# Patient Record
Sex: Male | Born: 1965 | Race: Black or African American | Hispanic: No | Marital: Single | Smoking: Former smoker
Health system: Southern US, Community
[De-identification: ages and names within clinical notes are randomized; demographics above are authoritative.]

## PROBLEM LIST (undated history)

## (undated) DIAGNOSIS — D332 Benign neoplasm of brain, unspecified: Secondary | ICD-10-CM

## (undated) DIAGNOSIS — C3490 Malignant neoplasm of unspecified part of unspecified bronchus or lung: Secondary | ICD-10-CM

## (undated) DIAGNOSIS — I10 Essential (primary) hypertension: Secondary | ICD-10-CM

---

## 2005-01-06 ENCOUNTER — Emergency Department: Payer: Self-pay | Admitting: General Practice

## 2005-05-14 ENCOUNTER — Emergency Department: Payer: Self-pay | Admitting: Internal Medicine

## 2005-06-13 ENCOUNTER — Other Ambulatory Visit: Payer: Self-pay

## 2005-06-13 ENCOUNTER — Emergency Department: Payer: Self-pay | Admitting: Internal Medicine

## 2007-02-04 ENCOUNTER — Ambulatory Visit: Payer: Self-pay | Admitting: Family Medicine

## 2008-01-06 ENCOUNTER — Emergency Department: Payer: Self-pay | Admitting: Unknown Physician Specialty

## 2009-01-18 ENCOUNTER — Emergency Department: Payer: Self-pay | Admitting: Emergency Medicine

## 2009-01-26 ENCOUNTER — Emergency Department: Payer: Self-pay | Admitting: Internal Medicine

## 2010-03-07 ENCOUNTER — Emergency Department: Payer: Self-pay | Admitting: Emergency Medicine

## 2014-01-16 ENCOUNTER — Emergency Department: Payer: Self-pay | Admitting: Emergency Medicine

## 2014-01-16 LAB — COMPREHENSIVE METABOLIC PANEL
ALK PHOS: 105 U/L
Albumin: 3.8 g/dL (ref 3.4–5.0)
Anion Gap: 9 (ref 7–16)
BUN: 11 mg/dL (ref 7–18)
Bilirubin,Total: 0.6 mg/dL (ref 0.2–1.0)
CO2: 25 mmol/L (ref 21–32)
CREATININE: 1.06 mg/dL (ref 0.60–1.30)
Calcium, Total: 8.2 mg/dL — ABNORMAL LOW (ref 8.5–10.1)
Chloride: 105 mmol/L (ref 98–107)
EGFR (African American): >60
EGFR (Non-African Amer.): >60
Glucose: 98 mg/dL (ref 65–99)
Osmolality: 277 (ref 275–301)
Potassium: 3.8 mmol/L (ref 3.5–5.1)
SGOT(AST): 341 U/L — ABNORMAL HIGH (ref 15–37)
SGPT (ALT): 321 U/L — ABNORMAL HIGH
Sodium: 139 mmol/L (ref 136–145)
TOTAL PROTEIN: 8.4 g/dL — AB (ref 6.4–8.2)

## 2014-01-16 LAB — URINALYSIS, COMPLETE
Bacteria: NONE SEEN
Bilirubin,UR: NEGATIVE
Blood: NEGATIVE
Glucose,UR: NEGATIVE mg/dL (ref 0–75)
KETONE: NEGATIVE
Leukocyte Esterase: NEGATIVE
Nitrite: NEGATIVE
PH: 5 (ref 4.5–8.0)
Protein: NEGATIVE
SPECIFIC GRAVITY: 1.018 (ref 1.003–1.030)
WBC UR: 1 /HPF (ref 0–5)

## 2014-01-16 LAB — CBC
HCT: 47.3 % (ref 40.0–52.0)
HGB: 15.6 g/dL (ref 13.0–18.0)
MCH: 33.4 pg (ref 26.0–34.0)
MCHC: 33 g/dL (ref 32.0–36.0)
MCV: 101 fL — AB (ref 80–100)
PLATELETS: 218 10*3/uL (ref 150–440)
RBC: 4.68 10*6/uL (ref 4.40–5.90)
RDW: 14.2 % (ref 11.5–14.5)
WBC: 6.4 10*3/uL (ref 3.8–10.6)

## 2014-01-16 LAB — LIPASE, BLOOD: Lipase: 155 U/L (ref 73–393)

## 2015-08-17 ENCOUNTER — Emergency Department
Admission: EM | Admit: 2015-08-17 | Discharge: 2015-08-17 | Disposition: A | Discharge status: Home / Self Care | Payer: Self-pay | Attending: Emergency Medicine | Admitting: Emergency Medicine

## 2015-08-17 DIAGNOSIS — R197 Diarrhea, unspecified: Secondary | ICD-10-CM | POA: Insufficient documentation

## 2015-08-17 DIAGNOSIS — R112 Nausea with vomiting, unspecified: Secondary | ICD-10-CM | POA: Insufficient documentation

## 2015-08-17 DIAGNOSIS — J069 Acute upper respiratory infection, unspecified: Secondary | ICD-10-CM | POA: Insufficient documentation

## 2015-08-17 DIAGNOSIS — Z880 Allergy status to penicillin: Secondary | ICD-10-CM | POA: Insufficient documentation

## 2015-08-17 DIAGNOSIS — F17200 Nicotine dependence, unspecified, uncomplicated: Secondary | ICD-10-CM | POA: Insufficient documentation

## 2015-08-17 LAB — RAPID INFLUENZA A&B ANTIGENS
Influenza A (ARMC): NOT DETECTED
Influenza B (ARMC): NOT DETECTED

## 2015-08-17 MED ORDER — ONDANSETRON 4 MG PO TBDP: 4.0000 mg | ORAL_TABLET | Freq: Four times a day (QID) | ORAL | Status: DC | PRN

## 2015-08-17 NOTE — ED Notes (Addendum)
Pt states that he started feeling bad, states that he is here with his fiance who is also a pt with similar s/s, pt states that he is also having some diarrhea, bodyaches, and weakness that started yesterday

## 2015-08-17 NOTE — ED Provider Notes (Signed)
[facility] Emergency Department Provider Note ____________________________________________  Time seen: 0715  I have reviewed the triage vital signs and the nursing notes.  HISTORY  Chief Complaint  Generalized Body Aches; Fever; and Weakness  HPI Lucas Yu is a 51 y.o. male presents to the ED for evaluation of sudden onset of subjective fevers, headache, sinus congestion and general muscle pain. He describes onset of symptoms just today morning, which popped him to call out of work. Since that time he has dosed Alka-Seltzer fist tabs with minimal relief. He did also have some mild nausea and vomiting yesterday, which is resolved today. Patient denies receiving a flu vaccine for this season. He presents here with his partner who presents with similar symptoms. The patient actually endorses that he feels better today than he did yesterday. He rates his generalized muscle pain at a 7/10 in triage.  History reviewed. No pertinent past medical history.  There are no active problems to display for this patient.  History reviewed. No pertinent past surgical history.  Current Outpatient Rx  Name  Route  Sig  Dispense  Refill  . ondansetron (ZOFRAN ODT) 4 MG disintegrating tablet   Oral   Take 1 tablet (4 mg total) by mouth every 6 (six) hours as needed for nausea or vomiting.   15 tablet   0    Allergies Penicillins  No family history on file.  Social History Social History  Substance Use Topics  . Smoking status: Current Every Day Smoker  . Smokeless tobacco: None  . Alcohol Use: Yes   Review of Systems  Constitutional: Positive for subjective fever. Eyes: Negative for visual changes. ENT: Negative for sore throat. Cardiovascular: Negative for chest pain. Respiratory: Negative for shortness of breath. Gastrointestinal: Positive for resolved abdominal pain, vomiting and diarrhea. Genitourinary: Negative for dysuria. Musculoskeletal: Negative for  back pain. Reports general muscle pain Skin: Negative for rash. Neurological: Negative for headaches, focal weakness or numbness. ____________________________________________  PHYSICAL EXAM:  VITAL SIGNS: ED Triage Vitals  Enc Vitals Group     BP 08/17/15 0622 119/95 mmHg     Pulse Rate 08/17/15 0622 65     Resp 08/17/15 0622 18     Temp 08/17/15 0622 97.7 F (36.5 C)     Temp Source 08/17/15 0622 Oral     SpO2 08/17/15 0622 100 %     Weight 08/17/15 0622 176 lb (79.833 kg)     Height 08/17/15 0622  (1.727 m)     Head Cir --      Peak Flow --      Pain Score 08/17/15 0623 7     Pain Loc --      Pain Edu? --      Excl. in GC? --    Constitutional: Alert and oriented. Well appearing and in no distress. Head: Normocephalic and atraumatic.      Eyes: Conjunctivae are normal. PERRL. Normal extraocular movements      Ears: Canals clear. TMs intact bilaterally.   Nose: No congestion/rhinorrhea.   Mouth/Throat: Mucous membranes are moist. Uvula midline and tonsils are flat.    Neck: Supple. No thyromegaly. Hematological/Lymphatic/Immunological: No cervical lymphadenopathy. Cardiovascular: Normal rate, regular rhythm.  Respiratory: Normal respiratory effort. No wheezes/rales/rhonchi. Gastrointestinal: Soft and nontender. No distention, rebound, guarding, rigidity, organomegaly. Bowel sounds. Musculoskeletal: Nontender with normal range of motion in all extremities.  Neurologic:  Normal gait without ataxia. Normal speech and language. No gross focal neurologic deficits are appreciated. Skin:  Skin is warm, dry and intact. No rash noted. Psychiatric: Mood and affect are normal. Patient exhibits appropriate insight and judgment. ____________________________________________   LABS (pertinent positives/negatives) Labs Reviewed  RAPID INFLUENZA A&B ANTIGENS ([facility] ONLY)  ____________________________________________  INITIAL IMPRESSION / ASSESSMENT AND PLAN / ED  COURSE  Patient with symptoms consistent with an upper respiratory infection and a mild gastroenteritis, most likely of a viral etiology. His rapid influenza was negative today. He will be discharged with a prescription for Zofran to dose as needed for nausea and vomiting control. He is to continue to treat his symptoms with over-the-counter cough and cold medications. He will follow up with [facility] for ongoing symptom management. Return precautions are reviewed. ____________________________________________  FINAL CLINICAL IMPRESSION(S) / ED DIAGNOSES  Final diagnoses:  URI, acute  Nausea vomiting and diarrhea      Lissa Hoard, PA-C 08/17/15 8119  Jennye Moccasin, MD 08/17/15 [phone]

## 2015-08-17 NOTE — Discharge Instructions (Signed)
Nausea, Adult Nausea means you feel sick to your stomach or need to throw up (vomit). It may be a sign of a more serious problem. If nausea gets worse, you may throw up. If you throw up a lot, you may lose too much body fluid (dehydration). HOME CARE   Get plenty of rest.  Ask your doctor how to replace body fluid losses (rehydrate).  Eat small amounts of food. Sip liquids more often.  Take all medicines as told by your doctor. GET HELP RIGHT AWAY IF:  You have a fever.  You pass out (faint).  You keep throwing up or have blood in your throw up.  You are very weak, have dry lips or a dry mouth, or you are very thirsty (dehydrated).  You have dark or bloody poop (stool).  You have very bad chest or belly (abdominal) pain.  You do not get better after 2 days, or you get worse.  You have a headache. MAKE SURE YOU:  Understand these instructions.  Will watch your condition.  Will get help right away if you are not doing well or get worse.   This information is not intended to replace advice given to you by your health care provider. Make sure you discuss any questions you have with your health care provider.   Document Released: 05/29/2011 Document Revised: 09/01/2011 Document Reviewed: 05/29/2011 Elsevier Interactive Patient Education 2016 Else[employer] Inc[employer]  Viral Infections A viral infection can be caused by different types of viruses.Most viral infections are not serious and resolve on their own. However, some infections may cause severe symptoms and may lead to further complications. SYMPTOMS Viruses can frequently cause:  Minor sore throat.  Aches and pains.  Headaches.  Runny nose.  Different types of rashes.  Watery eyes.  Tiredness.  Cough.  Loss of appetite.  Gastrointestinal infections, resulting in nausea, vomiting, and diarrhea. These symptoms do not respond to antibiotics because the infection is not caused by bacteria. However, you might catch  a bacterial infection following the viral infection. This is sometimes called a "superinfection." Symptoms of such a bacterial infection may include:  Worsening sore throat with pus and difficulty swallowing.  Swollen neck glands.  Chills and a high or persistent fever.  Severe headache.  Tenderness over the sinuses.  Persistent overall ill feeling (malaise), muscle aches, and tiredness (fatigue).  Persistent cough.  Yellow, green, or brown mucus production with coughing. HOME CARE INSTRUCTIONS   Only take over-the-counter or prescription medicines for pain, discomfort, diarrhea, or fever as directed by your caregiver.  Drink enough water and fluids to keep your urine clear or pale yellow. Sports drinks can provide valuable electrolytes, sugars, and hydration.  Get plenty of rest and maintain proper nutrition. Soups and broths with crackers or rice are fine. SEEK IMMEDIATE MEDICAL CARE IF:   You have severe headaches, shortness of breath, chest pain, neck pain, or an unusual rash.  You have uncontrolled vomiting, diarrhea, or you are unable to keep down fluids.  You or your child has an oral temperature above 102 F (38.9 C), not controlled by medicine.  Your baby is older than 3 months with a rectal temperature of 102 F (38.9 C) or higher.  Your baby is 17 months old or younger with a rectal temperature of 100.4 F (38 C) or higher. MAKE SURE YOU:   Understand these instructions.  Will watch your condition.  Will get help right away if you are not doing well or get worse.  This information is not intended to replace advice given to you by your health care provider. Make sure you discuss any questions you have with your health care provider.   Document Released: 03/19/2005 Document Revised: 09/01/2011 Document Reviewed: 11/15/2014 Elsevier Interactive Patient Education [employer]  Your exam is normal today. Your flu test was negative. Continue to monitor  and treat fevers. Take the anti-nausea medicine as needed for prevent dehydration. Follow-up with [facility] as needed for ongoing symptoms.

## 2015-08-17 NOTE — ED Notes (Signed)
Patient presents with body aches, fever, generalized fatigue and malaise, diarrhea and poor appetite. Drinking fluids but it comes "right through me."

## 2015-10-15 ENCOUNTER — Emergency Department
Admission: EM | Admit: 2015-10-15 | Discharge: 2015-10-15 | Disposition: A | Discharge status: Home / Self Care | Payer: Self-pay | Attending: Emergency Medicine | Admitting: Emergency Medicine

## 2015-10-15 ENCOUNTER — Encounter: Payer: Self-pay | Admitting: Emergency Medicine

## 2015-10-15 DIAGNOSIS — F17200 Nicotine dependence, unspecified, uncomplicated: Secondary | ICD-10-CM | POA: Insufficient documentation

## 2015-10-15 DIAGNOSIS — M545 Low back pain, unspecified: Secondary | ICD-10-CM

## 2015-10-15 MED ORDER — KETOROLAC TROMETHAMINE 30 MG/ML IJ SOLN
30.0000 mg | Freq: Once | INTRAMUSCULAR | Status: AC
  Administered 2015-10-15: 30 mg via INTRAMUSCULAR
  Filled 2015-10-15: qty 1

## 2015-10-15 MED ORDER — CYCLOBENZAPRINE HCL 5 MG PO TABS
5.0000 mg | ORAL_TABLET | Freq: Three times a day (TID) | ORAL | Status: DC | PRN
Start: 2015-10-15 — End: 2015-11-30

## 2015-10-15 MED ORDER — NAPROXEN 500 MG PO TABS: 500.0000 mg | ORAL_TABLET | Freq: Two times a day (BID) | ORAL | Status: DC

## 2015-10-15 NOTE — ED Provider Notes (Signed)
[facility] Emergency Department Provider Note    ____________________________________________  Time seen: 7:20  I have reviewed the triage vital signs and the nursing notes.   HISTORY  Chief Complaint Back Pain     HPI Lucas Yu is a 50 y.o. male, NAD, presents to the emergency department with complaints of lower back pain. He states the pain started Saturday, but cannot identify any inciting event that  caused the pain. Notes that he woke up with the pain. He describes the pain as pulling in nature.  Took Tylenol and Saturday and states he "laid around". Woke this morning and the pain was worse. Has not taken anything for the pain since yesterday.  Denies changes in urinary/bowel habits, saddle paraesthesias, or lower extremity numbness.  History reviewed. No pertinent past medical history.  There are no active problems to display for this patient.   History reviewed. No pertinent past surgical history.  Current Outpatient Rx  Name  Route  Sig  Dispense  Refill  . cyclobenzaprine (FLEXERIL) 5 MG tablet   Oral   Take 1 tablet (5 mg total) by mouth every 8 (eight) hours as needed for muscle spasms.   21 tablet   0   . naproxen (NAPROSYN) 500 MG tablet   Oral   Take 1 tablet (500 mg total) by mouth 2 (two) times daily with a meal.   14 tablet   0   . ondansetron (ZOFRAN ODT) 4 MG disintegrating tablet   Oral   Take 1 tablet (4 mg total) by mouth every 6 (six) hours as needed for nausea or vomiting.   15 tablet   0     Allergies Penicillins  History reviewed. No pertinent family history.  Social History Social History  Substance Use Topics  . Smoking status: Current Every Day Smoker  . Smokeless tobacco: None  . Alcohol Use: Yes    Review of Systems Constitutional: No fever/chills, fatigue Cardiovascular: No chest pain. Respiratory: No shortness of breath.  Musculoskeletal: Positive for lower back pain. No neck pain.  Skin:  Negative for rash, skin sores. Neurological: Negative for headaches, focal weakness or numbness. No saddle paresthesias, loss of bowel or bladder control 10-point ROS otherwise negative. ____________________________________________   PHYSICAL EXAM: Constitutional:   VITAL SIGNS: ED Triage Vitals  Enc Vitals Group     BP 10/15/15 0708 133/77 mmHg     Pulse Rate 10/15/15 0708 67     Resp 10/15/15 0708 18     Temp 10/15/15 0708 98.9 F (37.2 C)     Temp Source 10/15/15 0708 Oral     SpO2 10/15/15 0708 97 %     Weight 10/15/15 0708 170 lb (77.111 kg)     Height 10/15/15 0708  (1.727 m)     Head Cir --      Peak Flow --      Pain Score 10/15/15 0709 8     Pain Loc --      Pain Edu? --      Excl. in GC? --     Constitutional: Alert and oriented. Well appearing and in no distress. Eyes:  Conjunctivae are normal.  Head: Normocephalic and atraumatic. Cardiovascular: Peripheral pulses intact. Respiratory: Normal respiratory effort. Musculoskeletal: Mild limited flexion of the lumbar spine due to pain. Able to rotate fully. Mild tenderness to palpation of the lumbar spine and right lateral paraspinal areas. No step-offs or bony deformities noted. Mild right thoracic muscle spasm appreciated to  palpation.  Neurologic: Normal gait without ataxia. Normal speech and language. No gross focal neurologic deficits are appreciated. Skin: Skin is warm, dry and intact. No rash noted. Psychiatric: Mood and affect are normal. Patient exhibits appropriate insight and judgment   ____________________________________________  INITIAL IMPRESSION / ASSESSMENT AND PLAN / ED COURSE  Patient's diagnosis is consistent with lower back pain without sciatica. Patient will be discharged home with prescriptions for Flexeril and Naprosyn to take as directed. Patient was given IM Toradol 30 mg while in clinic and noted some improvement in pain with such. [facility] if symptoms persist past this treatment course. Patient is given ED precautions to return to the ED for any worsening or new symptoms.    ____________________________________________   FINAL CLINICAL IMPRESSION(S) / ED DIAGNOSES  Final diagnoses:  Midline low back pain without sciatica      Hope PigeonJami L Hagler, PA-C 10/15/15 0947  Myrna Blazeravid Matthew Schaevitz, MD 10/15/15 [phone]

## 2015-10-15 NOTE — ED Notes (Signed)
Pt present with lower back pain since Saturday. Denies injury or trauma; no prior instances of pain. NAD noted.

## 2015-10-15 NOTE — ED Notes (Signed)
Pt alert and oriented X4, active, cooperative, pt in NAD. RR even and unlabored, color WNL.  Pt informed to return if any life threatening symptoms occur.   

## 2015-10-15 NOTE — Discharge Instructions (Signed)
Back Exercises °The following exercises strengthen the muscles that help to support the back. They also help to keep the lower back flexible. Doing these exercises can help to prevent back pain or lessen existing pain. °If you have back pain or discomfort, try doing these exercises 2-3 times each day or as told by your health care provider. When the pain goes away, do them once each day, but increase the number of times that you repeat the steps for each exercise (do more repetitions). If you do not have back pain or discomfort, do these exercises once each day or as told by your health care provider. °EXERCISES °Single Knee to Chest °Repeat these steps 3-5 times for each leg: °· Lie on your back on a firm bed or the floor with your legs extended. °· Bring one knee to your chest. Your other leg should stay extended and in contact with the floor. °· Hold your knee in place by grabbing your knee or thigh. °· Pull on your knee until you feel a gentle stretch in your lower back. °· Hold the stretch for 10-30 seconds. °· Slowly release and straighten your leg. °Pelvic Tilt °Repeat these steps 5-10 times: °· Lie on your back on a firm bed or the floor with your legs extended. °· Bend your knees so they are pointing toward the ceiling and your feet are flat on the floor. °· Tighten your lower abdominal muscles to press your lower back against the floor. This motion will tilt your pelvis so your tailbone points up toward the ceiling instead of pointing to your feet or the floor. °· With gentle tension and even breathing, hold this position for 5-10 seconds. °Cat-Cow °Repeat these steps until your lower back becomes more flexible: °· Get into a hands-and-knees position on a firm surface. Keep your hands under your shoulders, and keep your knees under your hips. You may place padding under your knees for comfort. °· Let your head hang down, and point your tailbone toward the floor so your lower back becomes rounded like the  back of a cat. °· Hold this position for 5 seconds. °· Slowly lift your head and point your tailbone up toward the ceiling so your back forms a sagging arch like the back of a cow. °· Hold this position for 5 seconds. °Press-Ups °Repeat these steps 5-10 times: °· Lie on your abdomen (face-down) on the floor. °· Place your palms near your head, about shoulder-width apart. °· While you keep your back as relaxed as possible and keep your hips on the floor, slowly straighten your arms to raise the top half of your body and lift your shoulders. Do not use your back muscles to raise your upper torso. You may adjust the placement of your hands to make yourself more comfortable. °· Hold this position for 5 seconds while you keep your back relaxed. °· Slowly return to lying flat on the floor. °Bridges °Repeat these steps 10 times: °1. Lie on your back on a firm surface. °2. Bend your knees so they are pointing toward the ceiling and your feet are flat on the floor. °3. Tighten your buttocks muscles and lift your buttocks off of the floor until your waist is at almost the same height as your knees. You should feel the muscles working in your buttocks and the back of your thighs. If you do not feel these muscles, slide your feet 1-2 inches farther away from your buttocks. °4. Hold this position for 3-5   seconds. °5. Slowly lower your hips to the starting position, and allow your buttocks muscles to relax completely. °If this exercise is too easy, try doing it with your arms crossed over your chest. °Abdominal Crunches °Repeat these steps 5-10 times: °1. Lie on your back on a firm bed or the floor with your legs extended. °2. Bend your knees so they are pointing toward the ceiling and your feet are flat on the floor. °3. Cross your arms over your chest. °4. Tip your chin slightly toward your chest without bending your neck. °5. Tighten your abdominal muscles and slowly raise your trunk (torso) high enough to lift your shoulder  blades a tiny bit off of the floor. Avoid raising your torso higher than that, because it can put too much stress on your low back and it does not help to strengthen your abdominal muscles. °6. Slowly return to your starting position. °Back Lifts °Repeat these steps 5-10 times: °1. Lie on your abdomen (face-down) with your arms at your sides, and rest your forehead on the floor. °2. Tighten the muscles in your legs and your buttocks. °3. Slowly lift your chest off of the floor while you keep your hips pressed to the floor. Keep the back of your head in line with the curve in your back. Your eyes should be looking at the floor. °4. Hold this position for 3-5 seconds. °5. Slowly return to your starting position. °SEEK MEDICAL CARE IF: °· Your back pain or discomfort gets much worse when you do an exercise. °· Your back pain or discomfort does not lessen within 2 hours after you exercise. °If you have any of these problems, stop doing these exercises right away. Do not do them again unless your health care provider says that you can. °SEEK IMMEDIATE MEDICAL CARE IF: °· You develop sudden, severe back pain. If this happens, stop doing the exercises right away. Do not do them again unless your health care provider says that you can. °  °This information is not intended to replace advice given to you by your health care provider. Make sure you discuss any questions you have with your health care provider. °  °Document Released: 07/17/2004 Document Revised: 02/28/2015 Document Reviewed: 08/03/2014 °Elsevier Interactive Patient Education ©2016 Else[employer] Inc[employer] ° °Back Pain, Adult °Back pain is very common in adults. The cause of back pain is rarely dangerous and the pain often gets better over time. The cause of your back pain may not be known. Some common causes of back pain include: °· Strain of the muscles or ligaments supporting the spine. °· Wear and tear (degeneration) of the spinal disks. °· Arthritis. °· Direct injury  to the back. °For many people, back pain may return. Since back pain is rarely dangerous, most people can learn to manage this condition on their own. °HOME CARE INSTRUCTIONS °Watch your back pain for any changes. The following actions may help to lessen any discomfort you are feeling: °· Remain active. It is stressful on your back to sit or stand in one place for long periods of time. Do not sit, drive, or stand in one place for more than 30 minutes at a time. Take short walks on even surfaces as soon as you are able. Try to increase the length of time you walk each day. °· Exercise regularly as directed by your health care provider. Exercise helps your back heal faster. It also helps avoid future injury by keeping your muscles strong and flexible. °· Do not stay in   bed. Resting more than 1-2 days can delay your recovery. °· Pay attention to your body when you bend and lift. The most comfortable positions are those that put less stress on your recovering back. Always use proper lifting techniques, including: °¨ Bending your knees. °¨ Keeping the load close to your body. °¨ Avoiding twisting. °· Find a comfortable position to sleep. Use a firm mattress and lie on your side with your knees slightly bent. If you lie on your back, put a pillow under your knees. °· Avoid feeling anxious or stressed. Stress increases muscle tension and can worsen back pain. It is important to recognize when you are anxious or stressed and learn ways to manage it, such as with exercise. °· Take medicines only as directed by your health care provider. Over-the-counter medicines to reduce pain and inflammation are often the most helpful. Your health care provider may prescribe muscle relaxant drugs. These medicines help dull your pain so you can more quickly return to your normal activities and healthy exercise. °· Apply ice to the injured area: °¨ Put ice in a plastic bag. °¨ Place a towel between your skin and the bag. °¨ Leave the ice on  for 20 minutes, 2-3 times a day for the first 2-3 days. After that, ice and heat may be alternated to reduce pain and spasms. °· Maintain a healthy weight. Excess weight puts extra stress on your back and makes it difficult to maintain good posture. °SEEK MEDICAL CARE IF: °· You have pain that is not relieved with rest or medicine. °· You have increasing pain going down into the legs or buttocks. °· You have pain that does not improve in one week. °· You have night pain. °· You lose weight. °· You have a fever or chills. °SEEK IMMEDIATE MEDICAL CARE IF:  °· You develop new bowel or bladder control problems. °· You have unusual weakness or numbness in your arms or legs. °· You develop nausea or vomiting. °· You develop abdominal pain. °· You feel faint. °  °This information is not intended to replace advice given to you by your health care provider. Make sure you discuss any questions you have with your health care provider. °  °Document Released: 06/09/2005 Document Revised: 06/30/2014 Document Reviewed: 10/11/2013 °Elsevier Interactive Patient Education ©2016 [employer] ° °

## 2015-11-30 ENCOUNTER — Emergency Department
Admission: EM | Admit: 2015-11-30 | Discharge: 2015-11-30 | Disposition: A | Discharge status: Home / Self Care | Payer: Self-pay | Attending: Emergency Medicine | Admitting: Emergency Medicine

## 2015-11-30 DIAGNOSIS — F17200 Nicotine dependence, unspecified, uncomplicated: Secondary | ICD-10-CM | POA: Insufficient documentation

## 2015-11-30 DIAGNOSIS — H9202 Otalgia, left ear: Secondary | ICD-10-CM | POA: Insufficient documentation

## 2015-11-30 MED ORDER — NEOMYCIN-POLYMYXIN-HC 3.5-10000-1 OT SOLN: 3.0000 [drp] | Freq: Three times a day (TID) | OTIC | Status: AC

## 2015-11-30 NOTE — Discharge Instructions (Signed)
Earache An earache, also called otalgia, can be caused by many things. Pain from an earache can be sharp, dull, or burning. The pain may be temporary or constant. Earaches can be caused by problems with the ear, such as infection in either the middle ear or the ear canal, injury, impacted ear wax, middle ear pressure, or a foreign body in the ear. Ear pain can also result from problems in other areas. This is called referred pain. For example, pain can come from a sore throat, a tooth infection, or problems with the jaw or the joint between the jaw and the skull (temporomandibular joint, or TMJ). The cause of an earache is not always easy to identify. Watchful waiting may be appropriate for some earaches until a clear cause of the pain can be found. HOME CARE INSTRUCTIONS Watch your condition for any changes. The following actions may help to lessen any discomfort that you are feeling:  Take medicines only as directed by your health care provider. This includes ear drops.  Apply ice to your outer ear to help reduce pain.  Put ice in a plastic bag.  Place a towel between your skin and the bag.  Leave the ice on for 20 minutes, 2-3 times per day.  Do not put anything in your ear other than medicine that is prescribed by your health care provider.  Try resting in an upright position instead of lying down. This may help to reduce pressure in the middle ear and relieve pain.  Chew gum if it helps to relieve your ear pain.  Control any allergies that you have.  Keep all follow-up visits as directed by your health care provider. This is important. SEEK MEDICAL CARE IF:  Your pain does not improve within 2 days.  You have a fever.  You have new or worsening symptoms. SEEK IMMEDIATE MEDICAL CARE IF:  You have a severe headache.  You have a stiff neck.  You have difficulty swallowing.  You have redness or swelling behind your ear.  You have drainage from your ear.  You have hearing  loss.  You feel dizzy.   This information is not intended to replace advice given to you by your health care provider. Make sure you discuss any questions you have with your health care provider.   Document Released: 01/25/2004 Document Revised: 06/30/2014 Document Reviewed: 01/08/2014 Elsevier Interactive Patient Education 2016 [employer]   Do not use Q-tips. Begin using eardrops as directed. Follow-up and make an appointment with Dr. Jenne CampusMcQueen if any continued problems with your ears.

## 2015-11-30 NOTE — ED Notes (Signed)
Patient with onset of left ear pain since last week. States the pain goes down into this neck.

## 2015-11-30 NOTE — ED Notes (Signed)
Pt reports ear pain began 3 days ago.  Pt denies any drainage. C/o some pain around the ear and back of the neck.  Denies sore throat.  No meds taken for pain.

## 2015-11-30 NOTE — ED Provider Notes (Signed)
[facility] Emergency Department Provider Note   ____________________________________________  Time seen: Approximately 7:15 AM  I have reviewed the triage vital signs and the nursing notes.   HISTORY  Chief Complaint Otalgia   HPI SAAHIL HERBSTER is a 50 y.o. male here for left ear pain that began 3 days ago. Patient denies any cold symptoms, cough, runny nose, sneezing. He denies any fever or drainage from his ear. He denies any history of ear infections. He states that he did use a Q-tip but only uses it for the outside of his ears. He rates his pain as an 8/10.   History reviewed. No pertinent past medical history.  There are no active problems to display for this patient.   History reviewed. No pertinent past surgical history.  Current Outpatient Rx  Name  Route  Sig  Dispense  Refill  . neomycin-polymyxin-hydrocortisone (CORTISPORIN) otic solution   Left Ear   Place 3 drops into the left ear 3 (three) times daily.   10 mL   0     Allergies Penicillins  No family history on file.  Social History Social History  Substance Use Topics  . Smoking status: Current Every Day Smoker  . Smokeless tobacco: None  . Alcohol Use: Yes    Review of Systems Constitutional: No fever/chills Eyes: No visual changes. ENT: No sore throat. Positive left ear pain. Cardiovascular: Denies chest pain. Respiratory: Denies shortness of breath. Gastrointestinal:   No nausea, no vomiting.   Skin: Negative for rash. Neurological: Negative for headaches, focal weakness or numbness.  10-point ROS otherwise negative.  ____________________________________________   PHYSICAL EXAM:  VITAL SIGNS: ED Triage Vitals  Enc Vitals Group     BP 11/30/15 0623 111/75 mmHg     Pulse Rate 11/30/15 0623 70     Resp 11/30/15 0623 68     Temp 11/30/15 0623 98.4 F (36.9 C)     Temp Source 11/30/15 0623 Oral     SpO2 11/30/15 0623 99 %     Weight 11/30/15 0623  175 lb (79.379 kg)     Height 11/30/15 0623  (1.778 m)     Head Cir --      Peak Flow --      Pain Score 11/30/15 0624 8     Pain Loc --      Pain Edu? --      Excl. in GC? --     Constitutional: Alert and oriented. Well appearing and in no acute distress. Eyes: Conjunctivae are normal. PERRL. EOMI. Head: Atraumatic. Nose: No congestion/rhinnorhea.   EACs are clear bilaterally. TMs are dull with no erythema or injection seen. There is no exudate in the canal. Minimal tenderness on palpation of the [place] of the left ear. Prior to exam patient had both ears stuffed with put paper. Mouth/Throat: Mucous membranes are moist.  Oropharynx non-erythematous. Neck: No stridor.   Hematological/Lymphatic/Immunilogical: No cervical lymphadenopathy. Cardiovascular: Normal rate, regular rhythm. Grossly normal heart sounds.  Good peripheral circulation. Respiratory: Normal respiratory effort.  No retractions. Lungs CTAB. Musculoskeletal: Moves upper and lower extremities without difficulty and normal gait was noted. Neurologic:  Normal speech and language. No gross focal neurologic deficits are appreciated. No gait instability. Skin:  Skin is warm, dry and intact. No rash noted. Psychiatric: Mood and affect are normal. Speech and behavior are normal.  ____________________________________________   LABS (all labs ordered are listed, but only abnormal results are displayed)  Labs Reviewed - No  data to display   PROCEDURES  Procedure(s) performed: None  Critical Care performed: No  ____________________________________________   INITIAL IMPRESSION / ASSESSMENT AND PLAN / ED COURSE  Pertinent labs & imaging results that were available during my care of the patient were reviewed by me and considered in my medical decision making (see chart for details).  Patient is to follow-up with Dr. Jenne CampusMcQueen if any continued problems. There was some tenderness on visualization of the ear canal with  a ear speculum most likely due to patient using Q-tip. There was no trauma visualized. An TM bilaterally does not appear to be infected. Patient then states that he did not go to work today and needs a note excusing him from work. ____________________________________________   FINAL CLINICAL IMPRESSION(S) / ED DIAGNOSES  Final diagnoses:  Otalgia of left ear      NEW MEDICATIONS STARTED DURING THIS VISIT:  Discharge Medication List as of 11/30/2015  7:35 AM    START taking these medications   Details  neomycin-polymyxin-hydrocortisone (CORTISPORIN) otic solution Place 3 drops into the left ear 3 (three) times daily., Starting 11/30/2015, Until Mon 12/10/15, Print         Note:  This document was prepared using Dragon voice recognition software and may include unintentional dictation errors.    Tommi Rumpshonda L Summers, PA-C 11/30/15 [id number]  Jeanmarie PlantJames A McShane, MD 11/30/15 [id number]

## 2015-12-12 ENCOUNTER — Emergency Department
Admission: EM | Admit: 2015-12-12 | Discharge: 2015-12-12 | Disposition: A | Discharge status: Home / Self Care | Payer: Self-pay | Attending: Emergency Medicine | Admitting: Emergency Medicine

## 2015-12-12 ENCOUNTER — Encounter: Payer: Self-pay | Admitting: Emergency Medicine

## 2015-12-12 DIAGNOSIS — R110 Nausea: Secondary | ICD-10-CM | POA: Insufficient documentation

## 2015-12-12 DIAGNOSIS — F17200 Nicotine dependence, unspecified, uncomplicated: Secondary | ICD-10-CM | POA: Insufficient documentation

## 2015-12-12 DIAGNOSIS — K047 Periapical abscess without sinus: Secondary | ICD-10-CM | POA: Insufficient documentation

## 2015-12-12 MED ORDER — PROMETHAZINE HCL 12.5 MG PO TABS: 12.5000 mg | ORAL_TABLET | Freq: Four times a day (QID) | ORAL | Status: DC | PRN

## 2015-12-12 MED ORDER — HYDROCODONE-ACETAMINOPHEN 5-325 MG PO TABS: 1.0000 | ORAL_TABLET | Freq: Four times a day (QID) | ORAL | Status: DC | PRN

## 2015-12-12 MED ORDER — CLINDAMYCIN HCL 300 MG PO CAPS: 300.0000 mg | ORAL_CAPSULE | Freq: Three times a day (TID) | ORAL | Status: DC

## 2015-12-12 NOTE — ED Notes (Signed)
Patient presents to the ED with dental pain since Monday and nausea, vomiting and diarrhea that began last night.  Patient states, "I think I have an abscess in my tooth."   Patient reports 4 episodes of vomiting and 3 episodes of diarrhea in the past 24 hours.  Patient denies abdominal pain.  Patient states his only pain is on the right side of his face.  Patient is in no obvious distress at this time. Patient denies eating anything unusual yesterday.

## 2015-12-12 NOTE — ED Provider Notes (Signed)
[facility] Emergency Department Provider Note ____________________________________________  Time seen: Approximately 11:54 AM  I have reviewed the triage vital signs and the nursing notes.   HISTORY  Chief Complaint Dental Pain; Emesis; and Diarrhea   HPI Lucas Yu is a 50 y.o. male who arrives to the emergency department with complaint of dental pain since Saturday. He first noticed tingling in the right upper gum and swelling by Monday. On Tuesday he gargled hydrogen peroxide which helped with the pain for a period of time but when the pain returned it was significantly worse. He does not have a dentist that he sees regularly. States pain has been preventing him from completing tasks at work. Describes pain as aching and sharp rated at 9/10.  At 4 am last night he reports 3-4 episodes of vomiting without coffee ground appearance or hematemesis and 3 episodes of diarrhea over the last 24 hours without hematochezia or melena. Denies fevers chills, increased fatigue, abdominal pain, chest pain and palpitations.  History reviewed. No pertinent past medical history.  There are no active problems to display for this patient.   History reviewed. No pertinent past surgical history.  Current Outpatient Rx  Name  Route  Sig  Dispense  Refill  . clindamycin (CLEOCIN) 300 MG capsule   Oral   Take 1 capsule (300 mg total) by mouth 3 (three) times daily.   30 capsule   0   . HYDROcodone-acetaminophen (NORCO/VICODIN) 5-325 MG tablet   Oral   Take 1 tablet by mouth every 6 (six) hours as needed.   9 tablet   0   . promethazine (PHENERGAN) 12.5 MG tablet   Oral   Take 1 tablet (12.5 mg total) by mouth every 6 (six) hours as needed for nausea or vomiting.   12 tablet   0     Allergies Penicillins  No family history on file.  Social History Social History  Substance Use Topics  . Smoking status: Current Every Day Smoker  . Smokeless tobacco: None  .  Alcohol Use: Yes    Review of Systems Constitutional: Well appearing. No recent illness or injury. ENT: Positive for dental pain. Musculoskeletal: Negative for jaw pain/trismus. Skin: Negative for erythema, positive for facial swelling ____________________________________________   PHYSICAL EXAM:  VITAL SIGNS: ED Triage Vitals  Enc Vitals Group     BP 12/12/15 1118 116/95 mmHg     Pulse Rate 12/12/15 1118 71     Resp 12/12/15 1118 18     Temp 12/12/15 1118 98.3 F (36.8 C)     Temp Source 12/12/15 1118 Oral     SpO2 12/12/15 1118 100 %     Weight --      Height --      Head Cir --      Peak Flow --      Pain Score 12/12/15 1122 9     Pain Loc --      Pain Edu? --      Excl. in GC? --     Constitutional: Alert and oriented. Well appearing and in no acute distress. Eyes: Conjunctivae are normal.EOMI. Mouth/Throat: Mucous membranes are moist. Oropharynx non-erythematous. Periodontal Exam    Hematological/Lymphatic/Immunilogical: No cervical lymphadenopathy. Respiratory: Normal respiratory effort.  Musculoskeletal: Full ROM x 4 extremities. Neurologic:  Normal speech and language. No gross focal neurologic deficits are appreciated. Speech is normal. No gait instability. Skin:  Mild right upper facial swelling consistent with location of dental abscess. See periodontal  Dental exam above. Psychiatric: Mood and affect are normal. Speech and behavior are normal.  ____________________________________________   LABS (all labs ordered are listed, but only abnormal results are displayed)  Labs Reviewed - No data to display ____________________________________________   RADIOLOGY  Not indicated ____________________________________________   PROCEDURES  Procedure(s) performed: None  Critical Care performed: No  ____________________________________________   INITIAL IMPRESSION / ASSESSMENT AND PLAN / ED COURSE  Pertinent labs & imaging results that were  available during my care of the patient were reviewed by me and considered in my medical decision making (see chart for details). Patient will be prescribed clindamycin and Norco. Patient was advised to see the dentist within 14 days. Also advised to take the antibiotic until finished. Instructed to return to the ER for symptoms that change or worsen if you are unable to schedule an appointment. He was given an updated list of dental clinics in the area. ____________________________________________   FINAL CLINICAL IMPRESSION(S) / ED DIAGNOSES  Final diagnoses:  Dental abscess  Nausea    Note:  This document was prepared using Dragon voice recognition software and may include unintentional dictation errors.   Chinita Pester, FNP 12/12/15 1410  Minna Antis, MD 12/13/15 2007

## 2015-12-12 NOTE — Discharge Instructions (Signed)
Dental Abscess °A dental abscess is a collection of pus in or around a tooth. °CAUSES °This condition is caused by a bacterial infection around the root of the tooth that involves the inner part of the tooth (pulp). It may result from: °· Severe tooth decay. °· Trauma to the tooth that allows bacteria to enter into the pulp, such as a broken or chipped tooth. °· Severe gum disease around a tooth. °SYMPTOMS °Symptoms of this condition include: °· Severe pain in and around the infected tooth. °· Swelling and redness around the infected tooth, in the mouth, or in the face. °· Tenderness. °· Pus drainage. °· Bad breath. °· Bitter taste in the mouth. °· Difficulty swallowing. °· Difficulty opening the mouth. °· Nausea. °· Vomiting. °· Chills. °· Swollen neck glands. °· Fever. °DIAGNOSIS °This condition is diagnosed with examination of the infected tooth. During the exam, your dentist may tap on the infected tooth. Your dentist will also ask about your medical and dental history and may order X-rays. °TREATMENT °This condition is treated by eliminating the infection. This may be done with: °· Antibiotic medicine. °· A root canal. This may be performed to save the tooth. °· Pulling (extracting) the tooth. This may also involve draining the abscess. This is done if the tooth cannot be saved. °HOME CARE INSTRUCTIONS °· Take medicines only as directed by your dentist. °· If you were prescribed antibiotic medicine, finish all of it even if you start to feel better. °· Rinse your mouth (gargle) often with salt water to relieve pain or swelling. °· Do not drive or operate heavy machinery while taking pain medicine. °· Do not apply heat to the outside of your mouth. °· Keep all follow-up visits as directed by your dentist. This is important. °SEEK MEDICAL CARE IF: °· Your pain is worse and is not helped by medicine. °SEEK IMMEDIATE MEDICAL CARE IF: °· You have a fever or chills. °· Your symptoms suddenly get worse. °· You have a  very bad headache. °· You have problems breathing or swallowing. °· You have trouble opening your mouth. °· You have swelling in your neck or around your eye. °  °This information is not intended to replace advice given to you by your health care provider. Make sure you discuss any questions you have with your health care provider. °  °Document Released: 06/09/2005 Document Revised: 10/24/2014 Document Reviewed: 06/06/2014 °Elsevier Interactive Patient Education ©2016 Elsevier Inc. ° ° ° ° ° ° °OPTIONS FOR DENTAL FOLLOW UP CARE ° °[place] Department of Health and Human Services - Local Safety Net Dental Clinics °http://www.ncdhhs.gov/dph/oralhealth/services/safetynetclinics.htm °  °[facility] (336-562-3123) ° °[facility] [place] (919-933-9087) ° °[facility] (919-663-1744 ext 237) ° °[facility] (336-570-6415) ° °[facility] (919-968-2025) °This clinic caters to the indigent population and is on a lottery system. °Location: °[facility], [address], [place] °Clinic Hours: °Wednesdays from 6pm - 9pm, patients seen by a lottery system. °For dates, call or go to www.med.[facility].edu/[facility]/patients/Dental-[facility] °Services: °Cleanings, fillings and simple extractions. °Payment Options: °DENTAL WORK IS FREE OF CHARGE. Bring proof of income or support. °Best way to get seen: °Arrive at 5:15 pm - this is a lottery, NOT first come/first serve, so arriving earlier will not increase your chances of being seen. °  °  °[facility] °919-537-3737 °Select option 1 for emergencies °  °Location: °[facility], [address], [place] °  Clinic Hours: °No walk-ins accepted - call the day before to schedule an appointment. °Check in times are 9:30 am and 1:30 pm. °Services: °Simple extractions, temporary fillings, pulpectomy/pulp debridement, uncomplicated abscess drainage. °Payment Options: °PAYMENT IS  DUE AT THE TIME OF SERVICE.  Fee is usually $100-200, additional surgical procedures (e.g. abscess drainage) may be extra. °Cash, checks, Visa/MasterCard accepted.  Can file Medicaid if patient is covered for dental - patient should call case worker to check. °No discount for [facility] patients. °Best way to get seen: °MUST call the day before and get onto the schedule. Can usually be seen the next 1-2 days. No walk-ins accepted. °  °  °[facility] °[phone] °  °Location: °[facility], [address], [place] °Clinic Hours: °M, W, Th, F 8am or 1:30pm, Tues 9a or 1:30 - first come/first served. °Services: °Simple extractions, temporary fillings, uncomplicated abscess drainage.  You do not need to be an [place] resident. °Payment Options: °PAYMENT IS DUE AT THE TIME OF SERVICE. °Dental insurance, otherwise sliding scale - bring proof of income or support. °Depending on income and treatment needed, cost is usually $50-200. °Best way to get seen: °Arrive early as it is first come/first served. °  °  °[facility] °919-542-1641 °  °Location: °[address] °Clinic Hours: °Mon-Thu 8a-5p °Services: °Most basic dental services including extractions and fillings. °Payment Options: °PAYMENT IS DUE AT THE TIME OF SERVICE. °Sliding scale, up to 50% off - bring proof if income or support. °Medicaid with dental option accepted. °Best way to get seen: °Call to schedule an appointment, can usually be seen within 2 weeks OR they will try to see walk-ins - show up at 8a or 2p (you may have to wait). °  °  °[facility] °919-245-2435 °[place] RESIDENTS ONLY °  °Location: °[facility], [address], [place], [place] [address] °Clinic Hours: By appointment only. °Monday - Thursday 8am-5pm, Friday 8am-12pm °Services: Cleanings, fillings, extractions. °Payment Options: °PAYMENT IS DUE AT THE TIME OF  SERVICE. °Cash, Visa or MasterCard. Sliding scale - $30 minimum per service. °Best way to get seen: °Come in to office, complete packet and make an appointment - need proof of income °or support monies for each household member and proof of [place] residence. °Usually takes about a month to get in. °  °  °[facility] °[phone] °  °Location: °[address] [place] °Clinic Hours: Walk-in Urgent Care Dental Services are offered Monday-Friday mornings only. °The numbers of emergencies accepted daily is limited to the number of °providers available. °Maximum 15 - Mondays, Wednesdays & Thursdays °Maximum 10 - Tuesdays & Fridays °Services: °You do not need to be a [place] resident to be seen for a dental emergency. °Emergencies are defined as pain, swelling, abnormal bleeding, or dental trauma. Walkins will receive x-rays if needed. °NOTE: Dental cleaning is not an emergency. °Payment Options: °PAYMENT IS DUE AT THE TIME OF SERVICE. °Minimum co-pay is $40.00 for uninsured patients. °Minimum co-pay is $3.00 for Medicaid with dental coverage. °Dental Insurance is accepted and must be presented at time of visit. °Medicare does not cover dental. °Forms of payment: Cash, credit card, checks. °Best way to get seen: °If not previously registered with the clinic, walk-in dental registration begins at 7:15 am and is on a first come/first serve basis. °If previously registered with the clinic, call to make an appointment. °  °  °  [facility] °[phone] °[place] RESIDENTS ONLY °  °Location: °[address], [place], [place] °Clinic Hours: °Mon-Thu 10a-2p °Services: Extractions only! °Payment Options: °FREE (donations accepted) - bring proof of income or support °Best way to get seen: °Call and schedule an appointment OR come at 8am on the 1st Monday of every month (except for holidays) when it is first come/first served. °  °  °[facility] °[phone] °   °Location: °[address], [place] °Clinic Hours: °Friday mornings °Services, Payment Options, Best way to get seen: °Call for info °

## 2016-12-30 ENCOUNTER — Emergency Department
Admission: EM | Admit: 2016-12-30 | Discharge: 2016-12-30 | Disposition: A | Discharge status: Home / Self Care | Payer: Self-pay | Attending: Emergency Medicine | Admitting: Emergency Medicine

## 2016-12-30 ENCOUNTER — Encounter: Payer: Self-pay | Admitting: *Deleted

## 2016-12-30 DIAGNOSIS — F17200 Nicotine dependence, unspecified, uncomplicated: Secondary | ICD-10-CM | POA: Insufficient documentation

## 2016-12-30 DIAGNOSIS — K529 Noninfective gastroenteritis and colitis, unspecified: Secondary | ICD-10-CM | POA: Insufficient documentation

## 2016-12-30 LAB — COMPREHENSIVE METABOLIC PANEL
ALT: 100 U/L — AB (ref 17–63)
AST: 121 U/L — AB (ref 15–41)
Albumin: 4.5 g/dL (ref 3.5–5.0)
Alkaline Phosphatase: 72 U/L (ref 38–126)
Anion gap: 10 (ref 5–15)
BUN: 15 mg/dL (ref 6–20)
CHLORIDE: 97 mmol/L — AB (ref 101–111)
CO2: 28 mmol/L (ref 22–32)
CREATININE: 1.32 mg/dL — AB (ref 0.61–1.24)
Calcium: 9.4 mg/dL (ref 8.9–10.3)
GFR calc Af Amer: >60 mL/min (ref >60)
GFR calc non Af Amer: >60 mL/min (ref >60)
Glucose, Bld: 111 mg/dL — ABNORMAL HIGH (ref 65–99)
Potassium: 3.7 mmol/L (ref 3.5–5.1)
Sodium: 135 mmol/L (ref 135–145)
Total Bilirubin: 1.3 mg/dL — ABNORMAL HIGH (ref 0.3–1.2)
Total Protein: 9 g/dL — ABNORMAL HIGH (ref 6.5–8.1)

## 2016-12-30 LAB — CBC
HCT: 47.8 % (ref 40.0–52.0)
Hemoglobin: 16.2 g/dL (ref 13.0–18.0)
MCH: 33.2 pg (ref 26.0–34.0)
MCHC: 33.9 g/dL (ref 32.0–36.0)
MCV: 97.9 fL (ref 80.0–100.0)
PLATELETS: 231 10*3/uL (ref 150–440)
RBC: 4.89 MIL/uL (ref 4.40–5.90)
RDW: 14 % (ref 11.5–14.5)
WBC: 5.5 10*3/uL (ref 3.8–10.6)

## 2016-12-30 LAB — LIPASE, BLOOD: LIPASE: 27 U/L (ref 11–51)

## 2016-12-30 MED ORDER — ONDANSETRON 4 MG PO TBDP
4.0000 mg | ORAL_TABLET | Freq: Three times a day (TID) | ORAL | 0 refills | Status: DC | PRN
Start: 2016-12-30 — End: 2017-08-21

## 2016-12-30 MED ORDER — LOPERAMIDE HCL 2 MG PO TABS: 2.0000 mg | ORAL_TABLET | Freq: Four times a day (QID) | ORAL | 0 refills | Status: DC | PRN

## 2016-12-30 NOTE — ED Triage Notes (Signed)
States vomiting and diarrhea that began on Sunday, unable to remember how many episodes of each, states general body aches, denies any pain at present, pt awake and alert in no acute distress

## 2016-12-30 NOTE — ED Provider Notes (Signed)
[facility] Emergency Department Provider Note   ____________________________________________    I have reviewed the triage vital signs and the nursing notes.   HISTORY  Chief Complaint Emesis and Diarrhea     HPI Lucas Yu is a 51 y.o. male who presents with diarrhea and nausea and vomiting. He reports the symptoms started overnight and have continued through this morning. He is unclear what causes. Does report that his wife was sick last week with similar symptoms. He denies fevers but has had body aches. Mild abdominal cramping but none currently. No recent travel.   History reviewed. No pertinent past medical history.  There are no active problems to display for this patient.   History reviewed. No pertinent surgical history.  Prior to Admission medications   Medication Sig Start Date End Date Taking? Authorizing Provider  clindamycin (CLEOCIN) 300 MG capsule Take 1 capsule (300 mg total) by mouth 3 (three) times daily. Patient not taking: Reported on 12/30/2016 12/12/15   Kem Boroughsriplett, Cari B, FNP  HYDROcodone-acetaminophen (NORCO/VICODIN) 5-325 MG tablet Take 1 tablet by mouth every 6 (six) hours as needed. Patient not taking: Reported on 12/30/2016 12/12/15   Kem Boroughsriplett, Cari B, FNP  loperamide (IMODIUM A-D) 2 MG tablet Take 1 tablet (2 mg total) by mouth 4 (four) times daily as needed for diarrhea or loose stools. 12/30/16   Jene EveryKinner, Mishal Probert, MD  ondansetron (ZOFRAN ODT) 4 MG disintegrating tablet Take 1 tablet (4 mg total) by mouth every 8 (eight) hours as needed for nausea or vomiting. 12/30/16   Jene EveryKinner, Carley Strickling, MD  promethazine (PHENERGAN) 12.5 MG tablet Take 1 tablet (12.5 mg total) by mouth every 6 (six) hours as needed for nausea or vomiting. Patient not taking: Reported on 12/30/2016 12/12/15   Chinita Pesterriplett, Cari B, FNP     Allergies Penicillins  History reviewed. No pertinent family history.  Social History Social History  Substance Use  Topics  . Smoking status: Current Every Day Smoker  . Smokeless tobacco: Not on file  . Alcohol use Yes    Review of Systems  Constitutional: No fever/chills Eyes: No visual changes.  ENT: No sore throat. Cardiovascular: Denies chest pain. Respiratory: Denies shortness of breath. Gastrointestinal: As above Genitourinary: Negative for dysuria. Musculoskeletal: Negative for back pain. Skin: Negative for rash. Neurological: Negative for headaches or weakness   ____________________________________________   PHYSICAL EXAM:  VITAL SIGNS: ED Triage Vitals  Enc Vitals Group     BP 12/30/16 1021 139/85     Pulse Rate 12/30/16 1021 85     Resp 12/30/16 1021 16     Temp 12/30/16 1021 98.9 F (37.2 C)     Temp Source 12/30/16 1021 Oral     SpO2 12/30/16 1021 100 %     Weight 12/30/16 1020 68 kg (150 lb)     Height 12/30/16 1020 1.727 m (5\' 8" )     Head Circumference --      Peak Flow --      Pain Score --      Pain Loc --      Pain Edu? --      Excl. in GC? --     Constitutional: Alert and oriented. No acute distress. Pleasant and interactive Eyes: Conjunctivae are normal.  Head: Atraumatic. Nose: No congestion/rhinnorhea.  Cardiovascular: Normal rate, regular rhythm. Grossly normal heart sounds.  Good peripheral circulation. Respiratory: Normal respiratory effort.  No retractions. Lungs CTAB. Gastrointestinal: Soft and nontender. No distention.  No CVA tenderness.  Genitourinary: deferred Musculoskeletal: No lower extremity tenderness nor edema.  Warm and well perfused Neurologic:  Normal speech and language. No gross focal neurologic deficits are appreciated.  Skin:  Skin is warm, dry and intact. No rash noted. Psychiatric: Mood and affect are normal. Speech and behavior are normal.  ____________________________________________   LABS (all labs ordered are listed, but only abnormal results are displayed)  Labs Reviewed  COMPREHENSIVE METABOLIC PANEL - Abnormal;  Notable for the following:       Result Value   Chloride 97 (*)    Glucose, Bld 111 (*)    Creatinine, Ser 1.32 (*)    Total Protein 9.0 (*)    AST 121 (*)    ALT 100 (*)    Total Bilirubin 1.3 (*)    All other components within normal limits  LIPASE, BLOOD  CBC   ____________________________________________  EKG  None ____________________________________________  RADIOLOGY  None ____________________________________________   PROCEDURES  Procedure(s) performed: No    Critical Care performed: No ____________________________________________   INITIAL IMPRESSION / ASSESSMENT AND PLAN / ED COURSE  Pertinent labs & imaging results that were available during my care of the patient were reviewed by me and considered in my medical decision making (see chart for details).  Patient well-appearing and in no acute distress. Chronic elevation of LFTs. Well appearing clinically, recommend symptomatic treatment of likely viral gastroenteritis    ____________________________________________   FINAL CLINICAL IMPRESSION(S) / ED DIAGNOSES  Final diagnoses:  Gastroenteritis      NEW MEDICATIONS STARTED DURING THIS VISIT:  Discharge Medication List as of 12/30/2016 11:24 AM    START taking these medications   Details  loperamide (IMODIUM A-D) 2 MG tablet Take 1 tablet (2 mg total) by mouth 4 (four) times daily as needed for diarrhea or loose stools., Starting Tue 12/30/2016, Print    ondansetron (ZOFRAN ODT) 4 MG disintegrating tablet Take 1 tablet (4 mg total) by mouth every 8 (eight) hours as needed for nausea or vomiting., Starting Tue 12/30/2016, Print         Note:  This document was prepared using Dragon voice recognition software and may include unintentional dictation errors.    Jene Every, MD 12/30/16 [phone]

## 2017-04-27 ENCOUNTER — Encounter: Payer: Self-pay | Admitting: Emergency Medicine

## 2017-04-27 ENCOUNTER — Other Ambulatory Visit: Payer: Self-pay

## 2017-04-27 ENCOUNTER — Emergency Department
Admission: EM | Admit: 2017-04-27 | Discharge: 2017-04-27 | Disposition: A | Discharge status: Home / Self Care | Payer: Self-pay | Attending: Emergency Medicine | Admitting: Emergency Medicine

## 2017-04-27 DIAGNOSIS — K0889 Other specified disorders of teeth and supporting structures: Secondary | ICD-10-CM

## 2017-04-27 DIAGNOSIS — F17200 Nicotine dependence, unspecified, uncomplicated: Secondary | ICD-10-CM | POA: Insufficient documentation

## 2017-04-27 DIAGNOSIS — K047 Periapical abscess without sinus: Secondary | ICD-10-CM | POA: Insufficient documentation

## 2017-04-27 MED ORDER — CLINDAMYCIN HCL 300 MG PO CAPS: 300.0000 mg | ORAL_CAPSULE | Freq: Four times a day (QID) | ORAL | 0 refills | Status: DC

## 2017-04-27 MED ORDER — HYDROCODONE-ACETAMINOPHEN 5-325 MG PO TABS: 1.0000 | ORAL_TABLET | ORAL | 0 refills | Status: DC | PRN

## 2017-04-27 MED ORDER — OXYCODONE-ACETAMINOPHEN 5-325 MG PO TABS
1.0000 | ORAL_TABLET | Freq: Once | ORAL | Status: AC
  Administered 2017-04-27: 1 via ORAL
  Filled 2017-04-27: qty 1

## 2017-04-27 MED ORDER — CLINDAMYCIN HCL 150 MG PO CAPS
300.0000 mg | ORAL_CAPSULE | Freq: Once | ORAL | Status: AC
  Administered 2017-04-27: 300 mg via ORAL
  Filled 2017-04-27: qty 2

## 2017-04-27 NOTE — Discharge Instructions (Signed)
OPTIONS FOR DENTAL FOLLOW UP CARE ° °[place] Department of Health and Human Services - Local Safety Net Dental Clinics °http://www.ncdhhs.gov/dph/oralhealth/services/safetynetclinics.htm °  °[facility] (336-562-3123) ° °[facility] [place] ([phone]) ° °[facility] ([phone] ext [phone]) ° °[facility] ([phone]) ° °[facility] ([phone]) °This clinic caters to the indigent population and is on a lottery system. °Location: °[facility], [address], [place] °Clinic Hours: °Wednesdays from 6pm - 9pm, patients seen by a lottery system. °For dates, call or go to [email or web address]/[facility]/patients/[email or web address] °Services: °Cleanings, fillings and simple extractions. °Payment Options: °DENTAL WORK IS FREE OF CHARGE. Bring proof of income or support. °Best way to get seen: °Arrive at 5:15 pm - this is a lottery, NOT first come/first serve, so arriving earlier will not increase your chances of being seen. °  °  °[facility] °[phone] °Select option 1 for emergencies °  °Location: °[facility], [address], [place] °Clinic Hours: °No walk-ins accepted - call the day before to schedule an appointment. °Check in times are 9:30 am and 1:30 pm. °Services: °Simple extractions, temporary fillings, pulpectomy/pulp debridement, uncomplicated abscess drainage. °Payment Options: °PAYMENT IS DUE AT THE TIME OF SERVICE.  Fee is usually $100-200, additional surgical procedures (e.g. abscess drainage) may be extra. °Cash, checks, Visa/MasterCard accepted.  Can file Medicaid if patient is covered for dental - patient should call case worker to check. °No discount for [facility] patients. °Best way to get seen: °MUST call the day before and get onto the schedule. Can usually be seen the next 1-2 days. No walk-ins accepted. °  °  °[facility] °[phone] °   °Location: °[facility], [address], [place] °Clinic Hours: °M, W, Th, F 8am or 1:30pm, Tues 9a or 1:30 - first come/first served. °Services: °Simple extractions, temporary fillings, uncomplicated abscess drainage.  You do not need to be an [place] resident. °Payment Options: °PAYMENT IS DUE AT THE TIME OF SERVICE. °Dental insurance, otherwise sliding scale - bring proof of income or support. °Depending on income and treatment needed, cost is usually $50-200. °Best way to get seen: °Arrive early as it is first come/first served. °  °  °[facility] °[phone] °  °Location: °[address] °Clinic Hours: °Mon-Thu 8a-5p °Services: °Most basic dental services including extractions and fillings. °Payment Options: °PAYMENT IS DUE AT THE TIME OF SERVICE. °Sliding scale, up to 50% off - bring proof if income or support. °Medicaid with dental option accepted. °Best way to get seen: °Call to schedule an appointment, can usually be seen within 2 weeks OR they will try to see walk-ins - show up at 8a or 2p (you may have to wait). °  °  °[facility] °[phone] °[place] RESIDENTS ONLY °  °Location: °[facility], [address], [place], [place] [address] °Clinic Hours: By appointment only. °Monday - Thursday 8am-5pm, Friday 8am-12pm °Services: Cleanings, fillings, extractions. °Payment Options: °PAYMENT IS DUE AT THE TIME OF SERVICE. °Cash, Visa or MasterCard. Sliding scale - $30 minimum per service. °Best way to get seen: °Come in to office, complete packet and make an appointment - need proof of income °or support monies for each household member and proof of [place] residence. °Usually takes about a month to get in. °  °  °[facility] °[phone] °  °Location: °[address]   [place] °Clinic Hours: Walk-in Urgent Care Dental Services are offered Monday-Friday  mornings only. °The numbers of emergencies accepted daily is limited to the number of °providers available. °Maximum 15 - Mondays, Wednesdays & Thursdays °Maximum 10 - Tuesdays & Fridays °Services: °You do not need to be a [place] resident to be seen for a dental emergency. °Emergencies are defined as pain, swelling, abnormal bleeding, or dental trauma. Walkins will receive x-rays if needed. °NOTE: Dental cleaning is not an emergency. °Payment Options: °PAYMENT IS DUE AT THE TIME OF SERVICE. °Minimum co-pay is $40.00 for uninsured patients. °Minimum co-pay is $3.00 for Medicaid with dental coverage. °Dental Insurance is accepted and must be presented at time of visit. °Medicare does not cover dental. °Forms of payment: Cash, credit card, checks. °Best way to get seen: °If not previously registered with the clinic, walk-in dental registration begins at 7:15 am and is on a first come/first serve basis. °If previously registered with the clinic, call to make an appointment. °  °  °[facility] °[phone] °[place] RESIDENTS ONLY °  °Location: °[address], [place], [place] °Clinic Hours: °Mon-Thu 10a-2p °Services: Extractions only! °Payment Options: °FREE (donations accepted) - bring proof of income or support °Best way to get seen: °Call and schedule an appointment OR come at 8am on the 1st Monday of every month (except for holidays) when it is first come/first served. °  °  °[facility] °[phone] °  °Location: °[address], [place] °Clinic Hours: °Friday mornings °Services, Payment Options, Best way to get seen: °Call for info °

## 2017-04-27 NOTE — ED Provider Notes (Signed)
[facility] Emergency Department Provider Note  ____________________________________________  Time seen: Approximately 7:29 PM  I have reviewed the triage vital signs and the nursing notes.   HISTORY  Chief Complaint Dental Pain    HPI Lucas Yu is a 51 y.o. male who presents emergency department complaining of left lower dentition pain.  Patient reports that pain has been increasing over the last 3 days.  No dental trauma.  No history of recurrent dental infections.  Patient states that he has bad dentition but does not have a dentist.  Patient denies any fevers or chills, difficulty breathing or swallowing.  Patient has not tried any medications for this complaint prior to  History reviewed. No pertinent past medical history.  There are no active problems to display for this patient.   No past surgical history on file.  Prior to Admission medications   Medication Sig Start Date End Date Taking? Authorizing Provider  clindamycin (CLEOCIN) 300 MG capsule Take 1 capsule (300 mg total) 4 (four) times daily by mouth. 04/27/17   Seferina Brokaw, Delorise RoyalsJonathan D, PA-C  HYDROcodone-acetaminophen (NORCO/VICODIN) 5-325 MG tablet Take 1 tablet every 4 (four) hours as needed by mouth for moderate pain. 04/27/17   Tara Wich, Delorise RoyalsJonathan D, PA-C  loperamide (IMODIUM A-D) 2 MG tablet Take 1 tablet (2 mg total) by mouth 4 (four) times daily as needed for diarrhea or loose stools. 12/30/16   Jene EveryKinner, Robert, MD  ondansetron (ZOFRAN ODT) 4 MG disintegrating tablet Take 1 tablet (4 mg total) by mouth every 8 (eight) hours as needed for nausea or vomiting. 12/30/16   Jene EveryKinner, Robert, MD  promethazine (PHENERGAN) 12.5 MG tablet Take 1 tablet (12.5 mg total) by mouth every 6 (six) hours as needed for nausea or vomiting. Patient not taking: Reported on 12/30/2016 12/12/15   Kem Boroughsriplett, Cari B, FNP    Allergies Penicillins  No family history on file.  Social History Social History   Tobacco  Use  . Smoking status: Current Every Day Smoker  Substance Use Topics  . Alcohol use: Yes  . Drug use: Not on file     Review of Systems  Constitutional: No fever/chills Eyes: No visual changes. No discharge ENT: Left lower dental pain Cardiovascular: no chest pain. Respiratory: no cough. No SOB. Gastrointestinal: No abdominal pain.  No nausea, no vomiting.   Musculoskeletal: Negative for musculoskeletal pain. Skin: Negative for rash, abrasions, lacerations, ecchymosis. Neurological: Negative for headaches, focal weakness or numbness. 10-point ROS otherwise negative.  ____________________________________________   PHYSICAL EXAM:  VITAL SIGNS: ED Triage Vitals [04/27/17 1756]  Enc Vitals Group     BP (!) 142/97     Pulse Rate 67     Resp 18     Temp 98.7 F (37.1 C)     Temp Source Oral     SpO2 100 %     Weight 170 lb (77.1 kg)     Height 5\' 8"  (1.727 m)     Head Circumference      Peak Flow      Pain Score 10     Pain Loc      Pain Edu?      Excl. in GC?      Constitutional: Alert and oriented. Well appearing and in no acute distress. Eyes: Conjunctivae are normal. PERRL. EOMI. Head: Atraumatic. ENT:      Ears:       Nose: No congestion/rhinnorhea.      Mouth/Throat: Mucous membranes are moist.  Scattered dental  loss, dental caries, mild gingivitis noted.  Patient has a loose first molar left lower dentition with surrounding erythema and edema.  No fractures noted.  No pustular drainage.  No appreciable abscess. Neck: No stridor.   Hematological/Lymphatic/Immunilogical: No cervical lymphadenopathy. Cardiovascular: Normal rate, regular rhythm. Normal S1 and S2.  Good peripheral circulation. Respiratory: Normal respiratory effort without tachypnea or retractions. Lungs CTAB. Good air entry to the bases with no decreased or absent breath sounds. Musculoskeletal: Full range of motion to all extremities. No gross deformities appreciated. Neurologic:  Normal  speech and language. No gross focal neurologic deficits are appreciated.  Skin:  Skin is warm, dry and intact. No rash noted. Psychiatric: Mood and affect are normal. Speech and behavior are normal. Patient exhibits appropriate insight and judgement.   ____________________________________________   LABS (all labs ordered are listed, but only abnormal results are displayed)  Labs Reviewed - No data to display ____________________________________________  EKG   ____________________________________________  RADIOLOGY   No results found.  ____________________________________________    PROCEDURES  Procedure(s) performed:    Procedures    Medications  oxyCODONE-acetaminophen (PERCOCET/ROXICET) 5-325 MG per tablet 1 tablet (not administered)  clindamycin (CLEOCIN) capsule 300 mg (not administered)     ____________________________________________   INITIAL IMPRESSION / ASSESSMENT AND PLAN / ED COURSE  Pertinent labs & imaging results that were available during my care of the patient were reviewed by me and considered in my medical decision making (see chart for details).  Review of the [place] CSRS was performed in accordance of the [facility] prior to dispensing any controlled drugs.     Patient's diagnosis is consistent with dental infection with loose dentition.  At this time, patient will be treated with antibiotics and limited pain medication.  Patient reports that he will follow-up with a dentist for extraction of the loose molar.. Patient will be discharged home with prescriptions for clindamycin and very limited Norco. Patient is to follow up with dentist as needed or otherwise directed. Patient is given ED precautions to return to the ED for any worsening or new symptoms.     ____________________________________________  FINAL CLINICAL IMPRESSION(S) / ED DIAGNOSES  Final diagnoses:  Dental abscess  Loosening of tooth      NEW MEDICATIONS STARTED DURING  THIS VISIT:  Clindamycin 300 mg 4 times daily for 7 days Norco 5-325 every 4 hours as needed for pain, dispense #10      This chart was dictated using voice recognition software/Dragon. Despite best efforts to proofread, errors can occur which can change the meaning. Any change was purely unintentional.    Racheal Patches, PA-C 04/27/17 2053    Myrna Blazer, MD 04/27/17 [phone]

## 2017-04-27 NOTE — ED Notes (Signed)
Patient's wife was present prior to discharge and left to bring the car around.

## 2017-04-27 NOTE — ED Triage Notes (Signed)
Pt reports left side jaw pain that began yesterday. Pt ambulatory to triage, no apparent distress noted.

## 2017-07-29 ENCOUNTER — Emergency Department: Payer: Self-pay

## 2017-07-29 ENCOUNTER — Other Ambulatory Visit: Payer: Self-pay

## 2017-07-29 ENCOUNTER — Encounter: Payer: Self-pay | Admitting: Emergency Medicine

## 2017-07-29 ENCOUNTER — Emergency Department
Admission: EM | Admit: 2017-07-29 | Discharge: 2017-07-29 | Disposition: A | Discharge status: Home / Self Care | Payer: Self-pay | Attending: Emergency Medicine | Admitting: Emergency Medicine

## 2017-07-29 DIAGNOSIS — N432 Other hydrocele: Secondary | ICD-10-CM | POA: Insufficient documentation

## 2017-07-29 DIAGNOSIS — Y939 Activity, unspecified: Secondary | ICD-10-CM | POA: Insufficient documentation

## 2017-07-29 DIAGNOSIS — S76211A Strain of adductor muscle, fascia and tendon of right thigh, initial encounter: Secondary | ICD-10-CM | POA: Insufficient documentation

## 2017-07-29 DIAGNOSIS — N50819 Testicular pain, unspecified: Secondary | ICD-10-CM

## 2017-07-29 DIAGNOSIS — Y929 Unspecified place or not applicable: Secondary | ICD-10-CM | POA: Insufficient documentation

## 2017-07-29 DIAGNOSIS — F17210 Nicotine dependence, cigarettes, uncomplicated: Secondary | ICD-10-CM | POA: Insufficient documentation

## 2017-07-29 DIAGNOSIS — Y999 Unspecified external cause status: Secondary | ICD-10-CM | POA: Insufficient documentation

## 2017-07-29 DIAGNOSIS — X500XXA Overexertion from strenuous movement or load, initial encounter: Secondary | ICD-10-CM | POA: Insufficient documentation

## 2017-07-29 DIAGNOSIS — N433 Hydrocele, unspecified: Secondary | ICD-10-CM

## 2017-07-29 DIAGNOSIS — Z79899 Other long term (current) drug therapy: Secondary | ICD-10-CM | POA: Insufficient documentation

## 2017-07-29 DIAGNOSIS — N442 Benign cyst of testis: Secondary | ICD-10-CM | POA: Insufficient documentation

## 2017-07-29 LAB — URINALYSIS, COMPLETE (UACMP) WITH MICROSCOPIC
Bacteria, UA: NONE SEEN
Bilirubin Urine: NEGATIVE
Glucose, UA: NEGATIVE mg/dL
Hgb urine dipstick: NEGATIVE
Ketones, ur: NEGATIVE mg/dL
Leukocytes, UA: NEGATIVE
Nitrite: NEGATIVE
Protein, ur: NEGATIVE mg/dL
SPECIFIC GRAVITY, URINE: 1.02 (ref 1.005–1.030)
pH: 5 (ref 5.0–8.0)

## 2017-07-29 LAB — CHLAMYDIA/NGC RT PCR (ARMC ONLY)
CHLAMYDIA TR: NOT DETECTED
N GONORRHOEAE: NOT DETECTED

## 2017-07-29 MED ORDER — IBUPROFEN 800 MG PO TABS: 800.0000 mg | ORAL_TABLET | Freq: Three times a day (TID) | ORAL | 0 refills | Status: DC | PRN

## 2017-07-29 NOTE — ED Notes (Signed)
Patient ambulatory to lobby with steady gait and NAD noted. Patient verbalized understanding of discharge instructions and follow-up care.  

## 2017-07-29 NOTE — ED Notes (Signed)
This RN present as witness for scrotal exam with Morrie SheldonAshley, PA-C. Patient tolerated procedure well.

## 2017-07-29 NOTE — ED Provider Notes (Signed)
[facility] Emergency Department Provider Note  ____________________________________________  Time seen: Approximately 12:20 PM  I have reviewed the triage vital signs and the nursing notes.   HISTORY  Chief Complaint Groin Pain    HPI Lucas Yu is a 52 y.o. male that presents emergency department for evaluation of right sided groin and testicular pain for 2 days.  Patient was is lifting a heavy rock and pain started shortly after.  He does a lot of heavy lifting for work.  He states that it feels like a pulled muscle.  No dysuria, urgency, frequency, penile discharge, rash, lesion.   History reviewed. No pertinent past medical history.  There are no active problems to display for this patient.   History reviewed. No pertinent surgical history.  Prior to Admission medications   Medication Sig Start Date End Date Taking? Authorizing Provider  clindamycin (CLEOCIN) 300 MG capsule Take 1 capsule (300 mg total) 4 (four) times daily by mouth. 04/27/17   Cuthriell, Delorise Royals, PA-C  HYDROcodone-acetaminophen (NORCO/VICODIN) 5-325 MG tablet Take 1 tablet every 4 (four) hours as needed by mouth for moderate pain. 04/27/17   Cuthriell, Delorise Royals, PA-C  ibuprofen (ADVIL,MOTRIN) 800 MG tablet Take 1 tablet (800 mg total) by mouth every 8 (eight) hours as needed. 07/29/17   Enid Derry, PA-C  loperamide (IMODIUM A-D) 2 MG tablet Take 1 tablet (2 mg total) by mouth 4 (four) times daily as needed for diarrhea or loose stools. 12/30/16   Jene Every, MD  ondansetron (ZOFRAN ODT) 4 MG disintegrating tablet Take 1 tablet (4 mg total) by mouth every 8 (eight) hours as needed for nausea or vomiting. 12/30/16   Jene Every, MD  promethazine (PHENERGAN) 12.5 MG tablet Take 1 tablet (12.5 mg total) by mouth every 6 (six) hours as needed for nausea or vomiting. Patient not taking: Reported on 12/30/2016 12/12/15   Kem Boroughs B, FNP    Allergies Penicillins  No family  history on file.  Social History Social History   Tobacco Use  . Smoking status: Current Every Day Smoker  . Smokeless tobacco: Never Used  Substance Use Topics  . Alcohol use: Yes    Comment: Occasionally  . Drug use: Yes    Types: Marijuana     Review of Systems  Constitutional: No fever/chills Cardiovascular: No chest pain. Respiratory:  No SOB. Gastrointestinal: No abdominal pain.  No nausea, no vomiting.  Genitourinary: Negative for dysuria. Skin: Negative for rash, abrasions, lacerations, ecchymosis.   ____________________________________________   PHYSICAL EXAM:  VITAL SIGNS: ED Triage Vitals  Enc Vitals Group     BP 07/29/17 0923 120/70     Pulse Rate 07/29/17 0923 72     Resp --      Temp 07/29/17 0923 98.1 F (36.7 C)     Temp Source 07/29/17 0923 Oral     SpO2 07/29/17 0923 100 %     Weight 07/29/17 0924 170 lb (77.1 kg)     Height 07/29/17 0924 5\' 8"  (1.727 m)     Head Circumference --      Peak Flow --      Pain Score 07/29/17 0924 8     Pain Loc --      Pain Edu? --      Excl. in GC? --      Constitutional: Alert and oriented. Well appearing and in no acute distress. Eyes: Conjunctivae are normal. PERRL. EOMI. Head: Atraumatic. ENT:      Ears:  Nose: No congestion/rhinnorhea.      Mouth/Throat: Mucous membranes are moist.  Neck: No stridor.  Cardiovascular: Good peripheral circulation. Respiratory: Normal respiratory effort without tachypnea or retractions.  Musculoskeletal: Full range of motion to all extremities. No gross deformities appreciated. Genitourinary: RN Vernona RiegerLaura present for testicular exam.  No rashes or lesions.  No testicular pain with palpation.  Tenderness to palpation in the right groin.  No hernia felt. Neurologic:  Normal speech and language. No gross focal neurologic deficits are appreciated.  Skin:  Skin is warm, dry and intact. No rash noted.   ____________________________________________   LABS (all labs  ordered are listed, but only abnormal results are displayed)  Labs Reviewed  URINALYSIS, COMPLETE (UACMP) WITH MICROSCOPIC - Abnormal; Notable for the following components:      Result Value   Color, Urine YELLOW (*)    APPearance HAZY (*)    Squamous Epithelial / LPF 0-5 (*)    All other components within normal limits  CHLAMYDIA/NGC RT PCR ([facility] ONLY)   ____________________________________________  EKG   ____________________________________________  RADIOLOGY Lexine BatonI, Makaya Juneau, personally viewed and evaluated these images (plain radiographs) as part of my medical decision making, as well as reviewing the written report by the radiologist.  [place] Scrotum  Result Date: 07/29/2017 CLINICAL DATA:  Three-day history of right scrotal region pain EXAM: SCROTAL ULTRASOUND DOPPLER ULTRASOUND OF THE TESTICLES TECHNIQUE: Complete ultrasound examination of the testicles, epididymis, and other scrotal structures was performed. Color and spectral Doppler ultrasound were also utilized to evaluate blood flow to the testicles. COMPARISON:  None. FINDINGS: Right testicle Measurements: 4.1 x 2.4 x 2.8 cm. No mass or microlithiasis visualized. Left testicle Measurements: 3.8 x 2.4 x 3.0 cm. No mass or microlithiasis visualized. Right epididymis: Normal in size and appearance. No evident inflammatory focus. Left epididymis: There is a 4 mm cyst in the head of the epididymis on the right. Left epididymis otherwise appears normal. No evident inflammatory focus. Hydrocele:  Small hydrocele on each side. Varicocele:  None visualized. Pulsed Doppler interrogation of both testes demonstrates normal low resistance arterial and venous waveforms bilaterally. No scrotal wall thickening or scrotal abscess on either side. IMPRESSION: Small hydroceles bilaterally. 4 mm cyst in the epididymal head on the left. Study otherwise unremarkable. In particular, no inflammatory foci in either epididymis or testis. No  intratesticular mass or testicular torsion on either side. Electronically Signed   By: Bretta BangWilliam  Woodruff III M.D.   On: 07/29/2017 11:35   [place] Scrotom Doppler  Result Date: 07/29/2017 CLINICAL DATA:  Three-day history of right scrotal region pain EXAM: SCROTAL ULTRASOUND DOPPLER ULTRASOUND OF THE TESTICLES TECHNIQUE: Complete ultrasound examination of the testicles, epididymis, and other scrotal structures was performed. Color and spectral Doppler ultrasound were also utilized to evaluate blood flow to the testicles. COMPARISON:  None. FINDINGS: Right testicle Measurements: 4.1 x 2.4 x 2.8 cm. No mass or microlithiasis visualized. Left testicle Measurements: 3.8 x 2.4 x 3.0 cm. No mass or microlithiasis visualized. Right epididymis: Normal in size and appearance. No evident inflammatory focus. Left epididymis: There is a 4 mm cyst in the head of the epididymis on the right. Left epididymis otherwise appears normal. No evident inflammatory focus. Hydrocele:  Small hydrocele on each side. Varicocele:  None visualized. Pulsed Doppler interrogation of both testes demonstrates normal low resistance arterial and venous waveforms bilaterally. No scrotal wall thickening or scrotal abscess on either side. IMPRESSION: Small hydroceles bilaterally. 4 mm cyst in the epididymal head on the left. Study  otherwise unremarkable. In particular, no inflammatory foci in either epididymis or testis. No intratesticular mass or testicular torsion on either side. Electronically Signed   By: Bretta Bang III M.D.   On: 07/29/2017 11:35    ____________________________________________    PROCEDURES  Procedure(s) performed:    Procedures    Medications - No data to display   ____________________________________________   INITIAL IMPRESSION / ASSESSMENT AND PLAN / ED COURSE  Pertinent labs & imaging results that were available during my care of the patient were reviewed by me and considered in my medical decision  making (see chart for details).  Review of the [place] CSRS was performed in accordance of the [facility] prior to dispensing any controlled drugs.     Patient's diagnosis is consistent with groin strain.  Vital signs and exam are reassuring.  Ultrasound consistent with small hydroceles bilaterally and cyst of left testis.  No testicular torsion.  No  hernia felt on exam.  Patient will be discharged home with prescriptions for ibuprofen. Patient is to follow up with PCP as directed. Patient is given ED precautions to return to the ED for any worsening or new symptoms.     ____________________________________________  FINAL CLINICAL IMPRESSION(S) / ED DIAGNOSES  Final diagnoses:  Testicular pain  Groin strain, right, initial encounter  Hydrocele of testis  Testicular cyst      NEW MEDICATIONS STARTED DURING THIS VISIT:  ED Discharge Orders        Ordered    ibuprofen (ADVIL,MOTRIN) 800 MG tablet  Every 8 hours PRN     07/29/17 1222          This chart was dictated using voice recognition software/Dragon. Despite best efforts to proofread, errors can occur which can change the meaning. Any change was purely unintentional.    Enid Derry, PA-C 07/29/17 1410    Pershing Proud Myra Rude, MD 07/29/17 [phone]

## 2017-07-29 NOTE — ED Triage Notes (Signed)
Patient complaining of right sided pain in his "sack".  Denies swelling or injury.  States he is voiding without pain.  Denies burning or discharge.  States he moved a rock in his yard day before yesterday and has pain since that time.

## 2017-08-21 ENCOUNTER — Emergency Department
Admission: EM | Admit: 2017-08-21 | Discharge: 2017-08-21 | Disposition: A | Discharge status: Home / Self Care | Payer: Self-pay | Attending: Emergency Medicine | Admitting: Emergency Medicine

## 2017-08-21 ENCOUNTER — Encounter: Payer: Self-pay | Admitting: Emergency Medicine

## 2017-08-21 DIAGNOSIS — B349 Viral infection, unspecified: Secondary | ICD-10-CM | POA: Insufficient documentation

## 2017-08-21 DIAGNOSIS — F17200 Nicotine dependence, unspecified, uncomplicated: Secondary | ICD-10-CM | POA: Insufficient documentation

## 2017-08-21 DIAGNOSIS — Z79899 Other long term (current) drug therapy: Secondary | ICD-10-CM | POA: Insufficient documentation

## 2017-08-21 MED ORDER — IBUPROFEN 800 MG PO TABS: 800.0000 mg | ORAL_TABLET | Freq: Three times a day (TID) | ORAL | 0 refills | Status: DC | PRN

## 2017-08-21 MED ORDER — PROMETHAZINE-CODEINE 6.25-10 MG/5ML PO SYRP: 5.0000 mL | ORAL_SOLUTION | Freq: Four times a day (QID) | ORAL | 0 refills | Status: DC | PRN

## 2017-08-21 NOTE — ED Notes (Signed)
Ambulatory to room with no problems. C/o chills/sweats, body aches and cough. Been taking alka seltzer plus and tylenol with some relief

## 2017-08-21 NOTE — ED Triage Notes (Signed)
Presents with body aches  Fever and cough

## 2017-08-21 NOTE — Discharge Instructions (Signed)
Increase the amount of water you are drinking. Take tylenol in addition to the prescription medications if needed for pain or fever.  Return to the ER for symptoms that change or worsen if unable to schedule an appointment.

## 2017-08-21 NOTE — ED Provider Notes (Signed)
[facility] Emergency Department Provider Note  ____________________________________________  Time seen: Approximately 8:15 AM  I have reviewed the triage vital signs and the nursing notes.   HISTORY  Chief Complaint Generalized Body Aches   HPI Lucas Yu is a 52 y.o. male who presents to the emergency department for evaluation and treatment of body aches, headache, nausea, fatigue, and cough. He has taken Alka Seltzer Cold without relief. Wife also sick with similar illness and tested negative for influenza yesterday. No known fever.  History reviewed. No pertinent past medical history.  There are no active problems to display for this patient.   History reviewed. No pertinent surgical history.  Prior to Admission medications   Medication Sig Start Date End Date Taking? Authorizing Provider  clindamycin (CLEOCIN) 300 MG capsule Take 1 capsule (300 mg total) 4 (four) times daily by mouth. 04/27/17   Cuthriell, Delorise Royals, PA-C  HYDROcodone-acetaminophen (NORCO/VICODIN) 5-325 MG tablet Take 1 tablet every 4 (four) hours as needed by mouth for moderate pain. 04/27/17   Cuthriell, Delorise Royals, PA-C  ibuprofen (ADVIL,MOTRIN) 800 MG tablet Take 1 tablet (800 mg total) by mouth every 8 (eight) hours as needed. 08/21/17   Maxwell Martorano, Rulon Eisenmenger B, FNP  loperamide (IMODIUM A-D) 2 MG tablet Take 1 tablet (2 mg total) by mouth 4 (four) times daily as needed for diarrhea or loose stools. 12/30/16   Jene Every, MD  promethazine-codeine (PHENERGAN WITH CODEINE) 6.25-10 MG/5ML syrup Take 5 mLs by mouth every 6 (six) hours as needed for cough. 08/21/17   Florenda Watt, Rulon Eisenmenger B, FNP    Allergies Penicillins  No family history on file.  Social History Social History   Tobacco Use  . Smoking status: Current Every Day Smoker  . Smokeless tobacco: Never Used  Substance Use Topics  . Alcohol use: Yes    Comment: Occasionally  . Drug use: Yes    Types: Marijuana    Review of  Systems Constitutional: Negative for fever/chills ENT: Positive for sore throat. Cardiovascular: Denies chest pain. Respiratory: Negative for shortness of breath. Positive for cough. Gastrointestinal: Positive for nausea,  Negative for vomiting.  No diarrhea.  Musculoskeletal: Positive for body aches Skin: Negative for rash. Neurological: Positive for headaches ____________________________________________   PHYSICAL EXAM:  VITAL SIGNS: ED Triage Vitals [08/21/17 0804]  Enc Vitals Group     BP 117/83     Pulse Rate 71     Resp 18     Temp 98.7 F (37.1 C)     Temp Source Oral     SpO2 100 %     Weight 170 lb (77.1 kg)     Height 5\' 8"  (1.727 m)     Head Circumference      Peak Flow      Pain Score      Pain Loc      Pain Edu?      Excl. in GC?     Constitutional: Alert and oriented. Acutely ill appearing and in no acute distress. Eyes: Conjunctivae are normal. EOMI. Ears: Bilateral TM normal Nose: Sinus congestion noted; no rhinnorhea. Mouth/Throat: Mucous membranes are moist.  Oropharynx erythematous. Tonsils flat without exudate. Neck: No stridor.  Lymphatic: No cervical lymphadenopathy. Cardiovascular: Normal rate, regular rhythm. Good peripheral circulation. Respiratory: Normal respiratory effort.  No retractions. Breath sounds clear to auscultation. Gastrointestinal: Soft and nontender.  Musculoskeletal: FROM x 4 extremities.  Neurologic:  Normal speech and language.  Skin:  Skin is warm, dry and intact. No  rash noted. Psychiatric: Mood and affect are normal. Speech and behavior are normal.  ____________________________________________   LABS (all labs ordered are listed, but only abnormal results are displayed)  Labs Reviewed - No data to display ____________________________________________  EKG  Not indicated. ____________________________________________  RADIOLOGY  Not  indicated. ____________________________________________   PROCEDURES  Procedure(s) performed: None  Critical Care performed: No ____________________________________________   INITIAL IMPRESSION / ASSESSMENT AND PLAN / ED COURSE  52 year old male presenting to the emergency department for evaluation treatment of symptoms and exam most consistent with viral syndrome.  He will be given a prescription for promethazine with codeine to control the cough and nausea.  He was encouraged to take the ibuprofen or Tylenol for pain or fever.  He was instructed to rest, increase fluid intake, and follow-up with the primary care provider for symptoms that are not improving over the week.  He was instructed to return to the emergency department for symptoms of change or worsen if he is unable to schedule an appointment.  Medications - No data to display  ED Discharge Orders        Ordered    ibuprofen (ADVIL,MOTRIN) 800 MG tablet  Every 8 hours PRN     08/21/17 0911    promethazine-codeine (PHENERGAN WITH CODEINE) 6.25-10 MG/5ML syrup  Every 6 hours PRN     08/21/17 0911      Pertinent labs & imaging results that were available during my care of the patient were reviewed by me and considered in my medical decision making (see chart for details).    If controlled substance prescribed during this visit, 12 month history viewed on the [place] prior to issuing an initial prescription for Schedule II or III opiod. ____________________________________________   FINAL CLINICAL IMPRESSION(S) / ED DIAGNOSES  Final diagnoses:  Viral syndrome    Note:  This document was prepared using Dragon voice recognition software and may include unintentional dictation errors.     Chinita Pesterriplett, Vedder Brittian B, FNP 08/21/17 [phone]    Minna AntisPaduchowski, Kevin, MD 08/21/17 1441

## 2018-02-11 ENCOUNTER — Emergency Department
Admission: EM | Admit: 2018-02-11 | Discharge: 2018-02-11 | Disposition: A | Discharge status: Home / Self Care | Payer: Self-pay | Attending: Emergency Medicine | Admitting: Emergency Medicine

## 2018-02-11 ENCOUNTER — Other Ambulatory Visit: Payer: Self-pay

## 2018-02-11 ENCOUNTER — Encounter: Payer: Self-pay | Admitting: Emergency Medicine

## 2018-02-11 DIAGNOSIS — R42 Dizziness and giddiness: Secondary | ICD-10-CM | POA: Insufficient documentation

## 2018-02-11 DIAGNOSIS — Z5321 Procedure and treatment not carried out due to patient leaving prior to being seen by health care provider: Secondary | ICD-10-CM | POA: Insufficient documentation

## 2018-02-11 DIAGNOSIS — R110 Nausea: Secondary | ICD-10-CM | POA: Insufficient documentation

## 2018-02-11 LAB — BASIC METABOLIC PANEL
ANION GAP: 9 (ref 5–15)
BUN: 15 mg/dL (ref 6–20)
CHLORIDE: 104 mmol/L (ref 98–111)
CO2: 27 mmol/L (ref 22–32)
Calcium: 9.3 mg/dL (ref 8.9–10.3)
Creatinine, Ser: 1.18 mg/dL (ref 0.61–1.24)
GFR calc non Af Amer: >60 mL/min (ref >60)
Glucose, Bld: 98 mg/dL (ref 70–99)
POTASSIUM: 4.2 mmol/L (ref 3.5–5.1)
SODIUM: 140 mmol/L (ref 135–145)

## 2018-02-11 LAB — URINALYSIS, COMPLETE (UACMP) WITH MICROSCOPIC
BILIRUBIN URINE: NEGATIVE
Bacteria, UA: NONE SEEN
Glucose, UA: NEGATIVE mg/dL
KETONES UR: NEGATIVE mg/dL
LEUKOCYTES UA: NEGATIVE
Nitrite: NEGATIVE
Protein, ur: NEGATIVE mg/dL
SPECIFIC GRAVITY, URINE: 1.02 (ref 1.005–1.030)
pH: 5 (ref 5.0–8.0)

## 2018-02-11 LAB — CBC
HCT: 46.1 % (ref 40.0–52.0)
HEMOGLOBIN: 15.8 g/dL (ref 13.0–18.0)
MCH: 33.7 pg (ref 26.0–34.0)
MCHC: 34.2 g/dL (ref 32.0–36.0)
MCV: 98.5 fL (ref 80.0–100.0)
Platelets: 198 10*3/uL (ref 150–440)
RBC: 4.68 MIL/uL (ref 4.40–5.90)
RDW: 14.2 % (ref 11.5–14.5)
WBC: 5.1 10*3/uL (ref 3.8–10.6)

## 2018-02-11 LAB — GLUCOSE, CAPILLARY: Glucose-Capillary: 75 mg/dL (ref 70–99)

## 2018-02-11 NOTE — ED Notes (Signed)
Pt called for room, no response. 

## 2018-02-11 NOTE — ED Triage Notes (Signed)
Pt here with c/o dizziness that began last night, worsening this am when he got to work, felt like he was going to pass out, denies cp, states nausea comes with the dizziness, appears in NAD at this time.

## 2018-02-11 NOTE — ED Notes (Signed)
1020 called for room, no response. 1030 Called for room, no response.

## 2018-05-17 ENCOUNTER — Emergency Department
Admission: EM | Admit: 2018-05-17 | Discharge: 2018-05-17 | Disposition: A | Discharge status: Home / Self Care | Payer: Self-pay | Attending: Emergency Medicine | Admitting: Emergency Medicine

## 2018-05-17 ENCOUNTER — Encounter: Payer: Self-pay | Admitting: Emergency Medicine

## 2018-05-17 DIAGNOSIS — M779 Enthesopathy, unspecified: Secondary | ICD-10-CM

## 2018-05-17 DIAGNOSIS — Y939 Activity, unspecified: Secondary | ICD-10-CM | POA: Insufficient documentation

## 2018-05-17 DIAGNOSIS — M654 Radial styloid tenosynovitis [de Quervain]: Secondary | ICD-10-CM | POA: Insufficient documentation

## 2018-05-17 DIAGNOSIS — M778 Other enthesopathies, not elsewhere classified: Secondary | ICD-10-CM

## 2018-05-17 DIAGNOSIS — M70841 Other soft tissue disorders related to use, overuse and pressure, right hand: Secondary | ICD-10-CM | POA: Insufficient documentation

## 2018-05-17 MED ORDER — NAPROXEN 500 MG PO TABS: 500.0000 mg | ORAL_TABLET | Freq: Two times a day (BID) | ORAL | 0 refills | Status: AC

## 2018-05-17 MED ORDER — NAPROXEN 500 MG PO TABS
500.0000 mg | ORAL_TABLET | Freq: Once | ORAL | Status: AC
  Administered 2018-05-17: 500 mg via ORAL
  Filled 2018-05-17: qty 1

## 2018-05-17 MED ORDER — TRAMADOL HCL 50 MG PO TABS
50.0000 mg | ORAL_TABLET | Freq: Once | ORAL | Status: AC
  Administered 2018-05-17: 50 mg via ORAL
  Filled 2018-05-17: qty 1

## 2018-05-17 NOTE — ED Triage Notes (Signed)
C/O 2 day history of right hand pain.  States pain shoots up right forearm and worse when moving thumb, first and second fingers.

## 2018-05-17 NOTE — ED Notes (Signed)
Esign not working pt verbalized discharge instructions and has no questions at this time 

## 2018-05-17 NOTE — ED Provider Notes (Signed)
[facility] Emergency Department Provider Note ____________________________________________  Time seen: 1255  I have reviewed the triage vital signs and the nursing notes.  HISTORY  Chief Complaint  Hand Pain  HPI Lucas Yu is a 52 y.o.right-handed male who presents to the ED for evaluation of right hand pain.  Patient reports he has been on the current job at a [employer] for the last 2 to 3 months.  He gives a several weeks history of increasing pain to the right forearm at the thumb as well as in the hand.  He describes the symptoms have increased sharply over the last 2 days.  He does admit to fine motor movements including pinching and grabbing yarn and threats with his thumb and first finger.  He denies any swelling, redness, bruising, warmth to the hands or fingers.  He also denies any catching, clicking, locking of his hands and joints.  He presents today localizing pain primarily to the thumb and the wrist aspect of the forearm with some increased pain with movement of the thumb and first 2 fingers of the hand.  Denies any other injury by trauma, contusion, or strain at this time.  He denies any history of chronic hand pain, carpal tunnel, or arthritis.  History reviewed. No pertinent past medical history.  There are no active problems to display for this patient.  History reviewed. No pertinent surgical history.  Prior to Admission medications   Medication Sig Start Date End Date Taking? Authorizing Provider  naproxen (NAPROSYN) 500 MG tablet Take 1 tablet (500 mg total) by mouth 2 (two) times daily with a meal for 15 days. 05/17/18 06/01/18  Fiora Weill, Charlesetta IvoryJenise V Bacon, PA-C    Allergies Penicillins  No family history on file.  Social History Social History   Tobacco Use  . Smoking status: Current Every Day Smoker  . Smokeless tobacco: Never Used  Substance Use Topics  . Alcohol use: Yes    Comment: Occasionally  . Drug use: Yes    Types:  Marijuana    Review of Systems  Constitutional: Negative for fever. Cardiovascular: Negative for chest pain. Respiratory: Negative for shortness of breath. Musculoskeletal: Negative for back pain. Right thumb/wrist/hand pain as above Skin: Negative for rash. Neurological: Negative for headaches, focal weakness or numbness. ____________________________________________  PHYSICAL EXAM:  VITAL SIGNS: ED Triage Vitals  Enc Vitals Group     BP 05/17/18 1158 118/73     Pulse Rate 05/17/18 1158 64     Resp 05/17/18 1158 17     Temp 05/17/18 1158 98.3 F (36.8 C)     Temp Source 05/17/18 1158 Oral     SpO2 05/17/18 1158 99 %     Weight 05/17/18 1157 169 lb 15.6 oz (77.1 kg)     Height 05/17/18 1200 5\' 8"  (1.727 m)     Head Circumference --      Peak Flow --      Pain Score 05/17/18 1157 10     Pain Loc --      Pain Edu? --      Excl. in GC? --     Constitutional: Alert and oriented. Well appearing and in no distress. Head: Normocephalic and atraumatic. Eyes: Conjunctivae are normal. Normal extraocular movements Cardiovascular: Normal rate, regular rhythm. Normal distal pulses. Respiratory: Normal respiratory effort. No wheezes/rales/rhonchi. Musculoskeletal: Right hand without any obvious deformity, dislocation, or effusion.  Patient is exquisitely tender to palpation over the extensor pollicis longus of the right wrist.  Patient with decreased grip strength noted on the right hand.  Secondary to pain he is unable to fully extend the right thumb.  Positive Finkelstein on exam.  Nontender with normal range of motion in all extremities.  Neurologic:  Normal gross sensation.  Normal intrinsic and opposition testing. Normal speech and language. No gross focal neurologic deficits are appreciated. Skin:  Skin is warm, dry and intact. No rash noted. ____________________________________________  PROCEDURES  Tramadol 50 mg PO Naproxen 500 mg PO  .Splint Application Date/Time:  05/17/2018 6:44 PM Performed by: Lissa Hoard, PA-C Authorized by: Lissa Hoard, PA-C   Consent:    Consent obtained:  Verbal   Consent given by:  Patient   Alternatives discussed:  Referral Pre-procedure details:    Sensation:  Normal Procedure details:    Laterality:  Right   Location:  Hand   Hand:  R hand   Splint type:  Thumb spica   Supplies:  Prefabricated splint Post-procedure details:    Pain:  Improved   Sensation:  Normal   Patient tolerance of procedure:  Tolerated well, no immediate complications  ____________________________________________  INITIAL IMPRESSION / ASSESSMENT AND PLAN / ED COURSE  Patient with ED evaluation of increasing left hand and thumb pain and stiffness.  Patient's clinical picture is consistent and concerning for probable de Quervain's tenosynovitis and mild overuse syndrome.  He is placed in appropriate thumb spica splint for support.  He is also given instructions on self-care of this potentially chronic intermittent condition.  He is referred to orthopedics for further evaluation and management.  He is discharged with a prescription for naproxen to take as directed, and will follow-up as appropriate.  Work is provided for 1 day as requested. ____________________________________________  FINAL CLINICAL IMPRESSION(S) / ED DIAGNOSES  Final diagnoses:  De Quervain's tenosynovitis, right  Thumb tendonitis      Lissa Hoard, PA-C 05/17/18 1847    Emily Filbert, MD 05/18/18 [phone]

## 2018-05-17 NOTE — Discharge Instructions (Signed)
Your exam is consistent with thumb tendinitis and mild overuse syndrome. Wear the thumb splint while working. Apply ice and use warm epsom salt soaks to reduce symptoms. Follow-up with orthopedics as needed.

## 2018-09-10 IMAGING — US US SCROTUM W/ DOPPLER COMPLETE
1 series · 13 of 25 positions shown · non-contrast
Comparison: None.

CLINICAL DATA: Three-day history of right scrotal region pain

EXAM:
SCROTAL ULTRASOUND
DOPPLER ULTRASOUND OF THE TESTICLES
TECHNIQUE: Complete ultrasound examination of the testicles, epididymis, and
other scrotal structures was performed. Color and spectral Doppler
ultrasound were also utilized to evaluate blood flow to the
testicles.

[Series 1: us scrotum w/ doppler complete · 0.06mm/px · 13 of 69 slices shown]
[im 1/69]
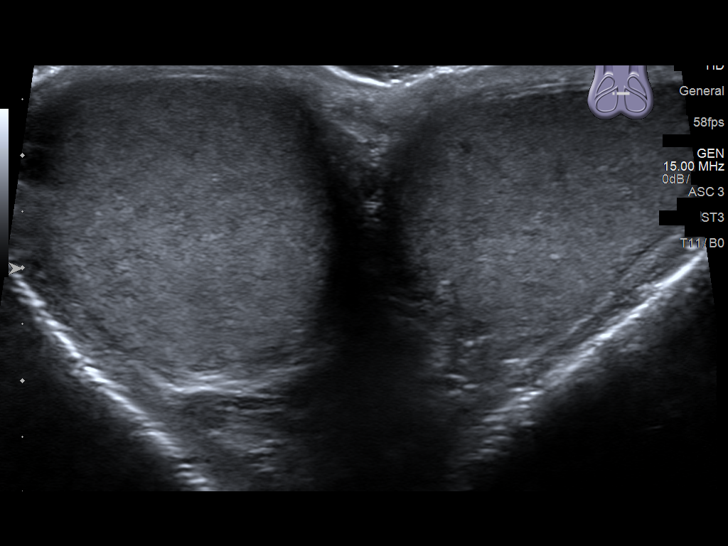
[im 6/69]
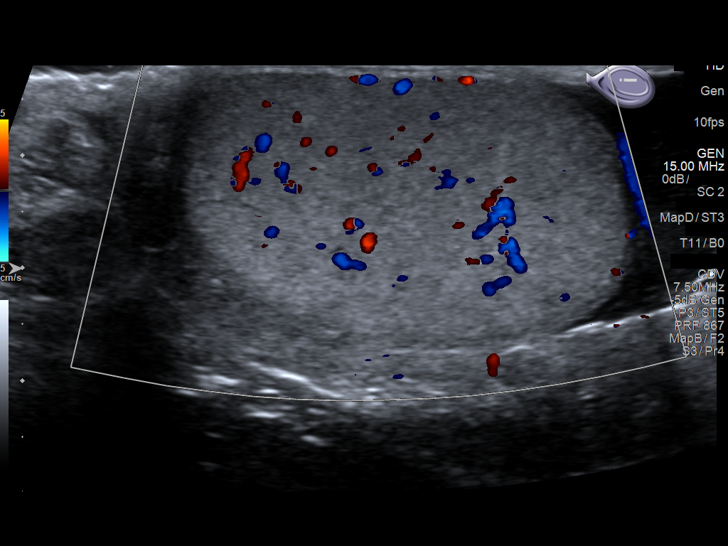
[im 12/69]
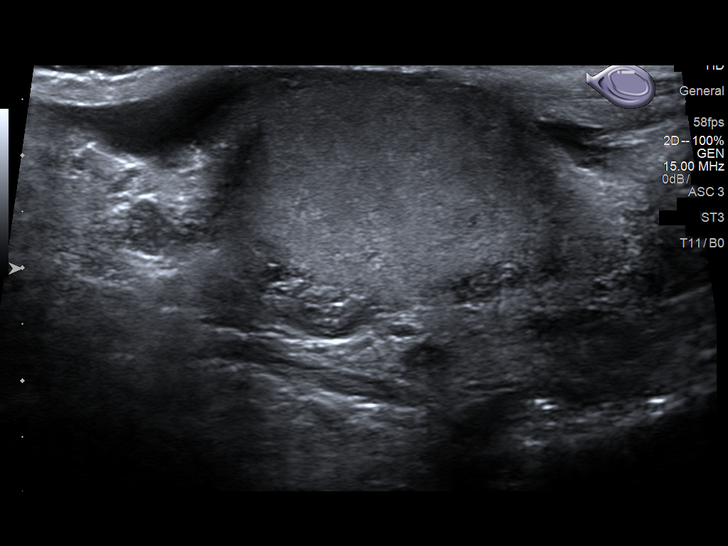
[im 18/69]
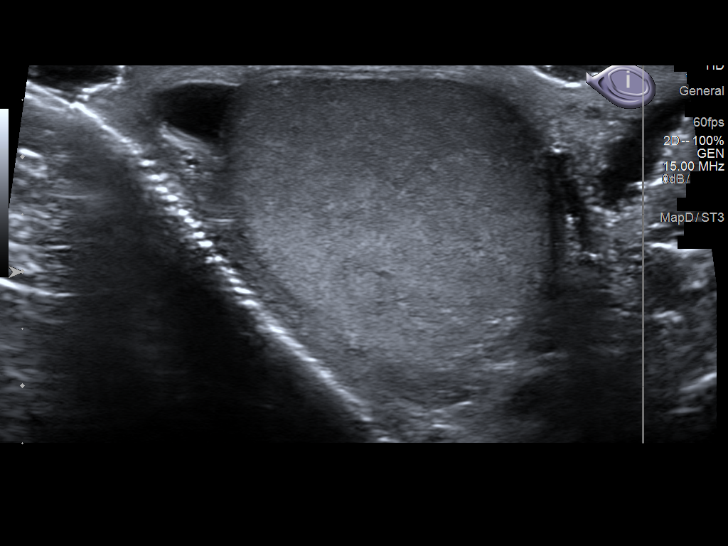
[im 23/69]
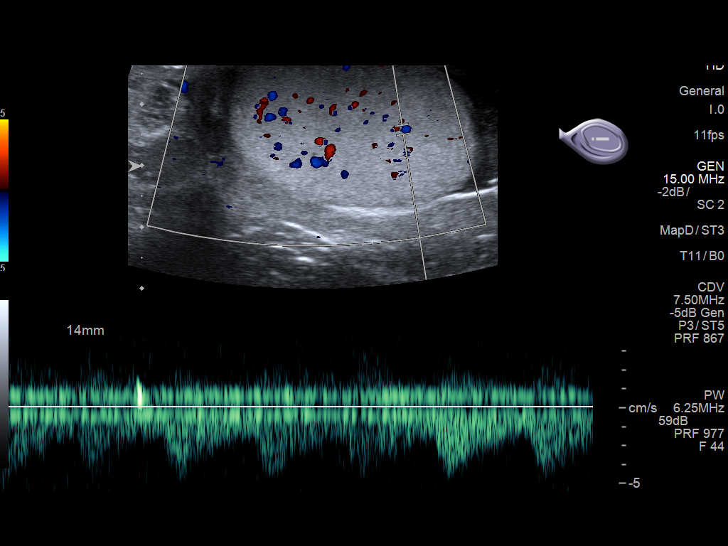
[im 29/69]
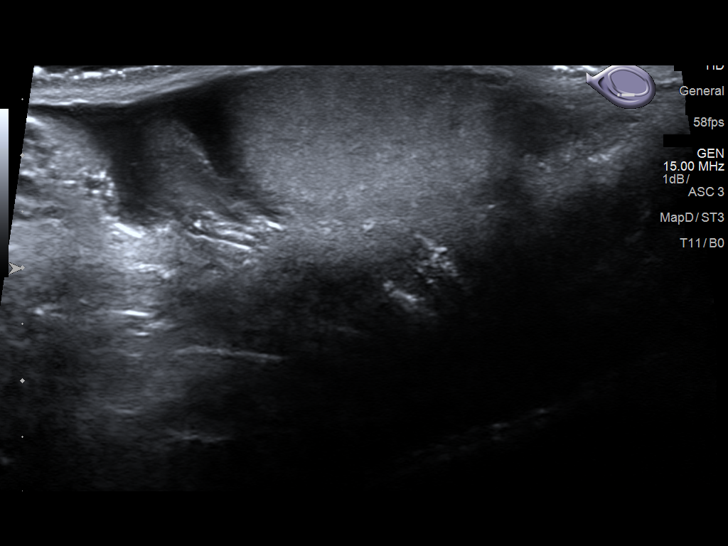
[im 35/69]
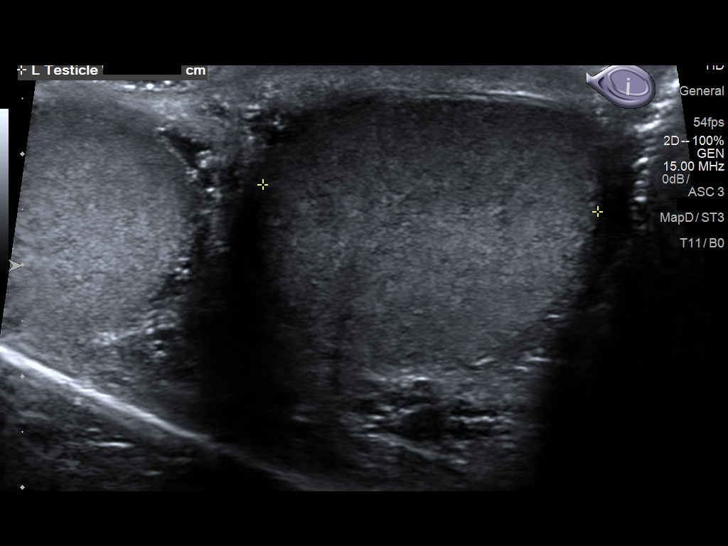
[im 40/69]
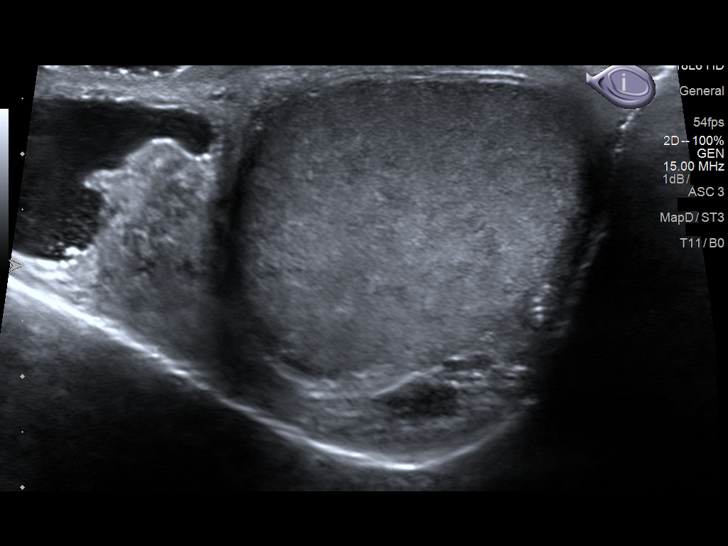
[im 46/69]
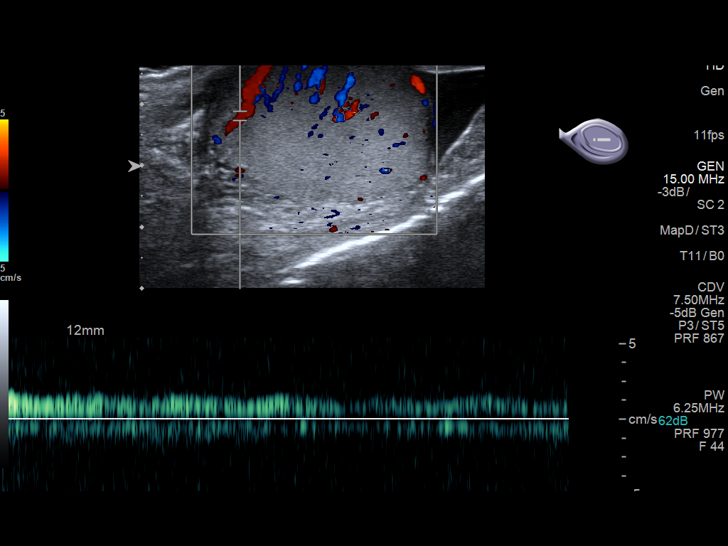
[im 52/69]
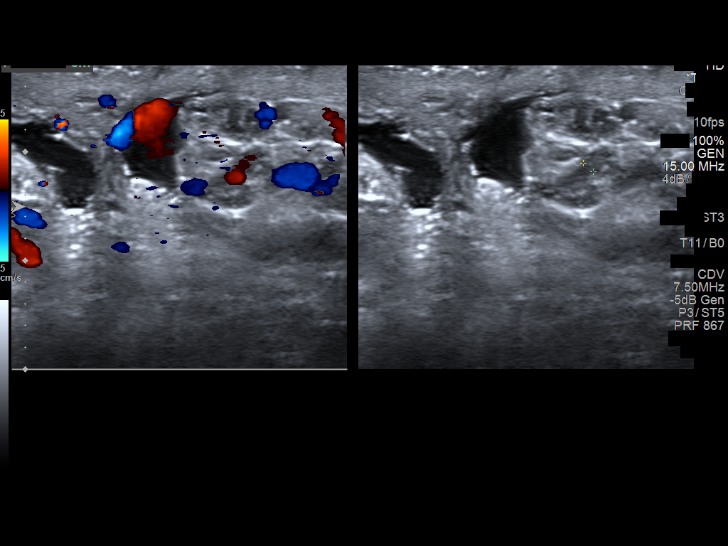
[im 57/69]
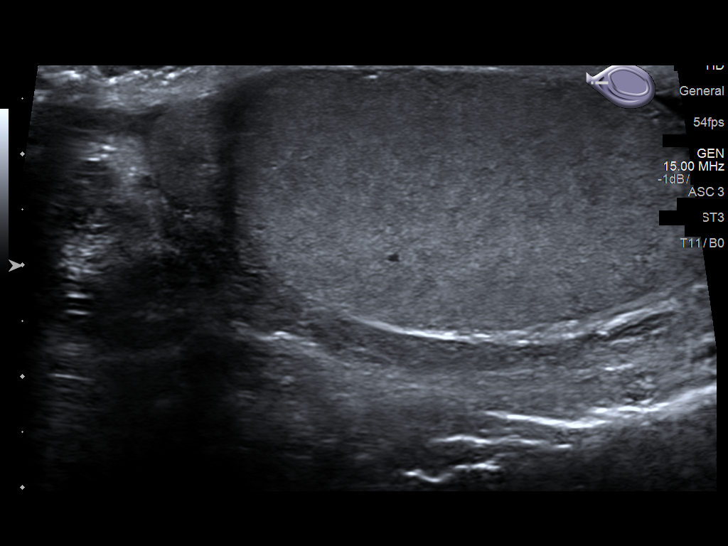
[im 63/69]
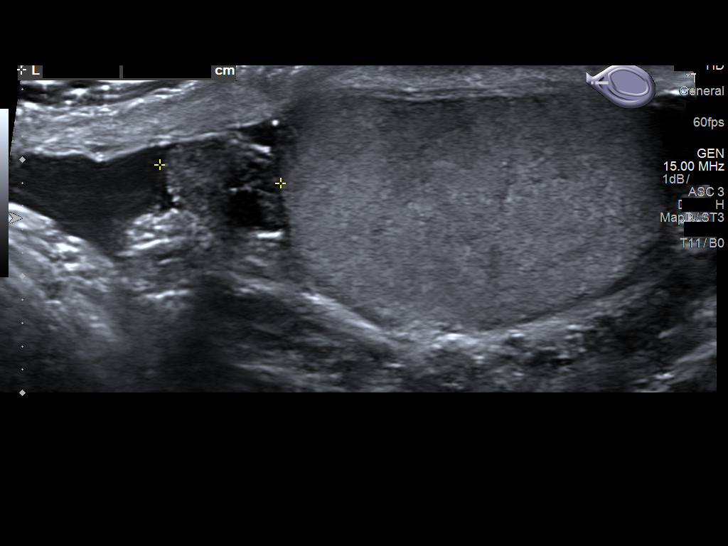
[im 69/69]
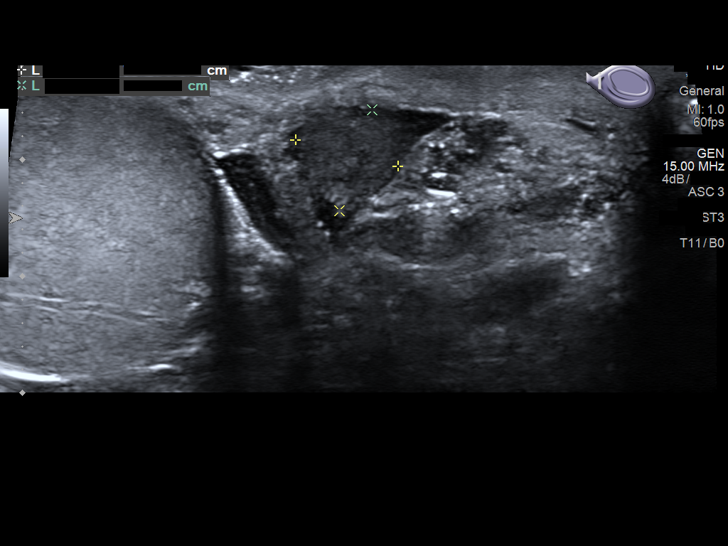

[13 of 25 positions shown; findings below may reference images not displayed]

FINDINGS: Right testicle

Measurements: 4.1 x 2.4 x 2.8 cm. No mass or microlithiasis
visualized.

Left testicle

Measurements: 3.8 x 2.4 x 3.0 cm. No mass or microlithiasis
visualized.

Right epididymis: Normal in size and appearance. No evident
inflammatory focus.

Left epididymis: There is a 4 mm cyst in the head of the epididymis
on the right. Left epididymis otherwise appears normal. No evident
inflammatory focus.

Hydrocele:  Small hydrocele on each side.

Varicocele:  None visualized.

Pulsed Doppler interrogation of both testes demonstrates normal low
resistance arterial and venous waveforms bilaterally.

No scrotal wall thickening or scrotal abscess on either side.
IMPRESSION: Small hydroceles bilaterally. 4 mm cyst in the epididymal head on
the left. Study otherwise unremarkable. In particular, no
inflammatory foci in either epididymis or testis. No intratesticular
mass or testicular torsion on either side.

## 2018-09-12 ENCOUNTER — Other Ambulatory Visit: Payer: Self-pay

## 2018-09-12 ENCOUNTER — Emergency Department
Admission: EM | Admit: 2018-09-12 | Discharge: 2018-09-12 | Disposition: A | Discharge status: Home / Self Care | Payer: Self-pay | Attending: Emergency Medicine | Admitting: Emergency Medicine

## 2018-09-12 ENCOUNTER — Emergency Department: Payer: Self-pay

## 2018-09-12 ENCOUNTER — Encounter: Payer: Self-pay | Admitting: Emergency Medicine

## 2018-09-12 DIAGNOSIS — M545 Low back pain, unspecified: Secondary | ICD-10-CM

## 2018-09-12 DIAGNOSIS — Y9289 Other specified places as the place of occurrence of the external cause: Secondary | ICD-10-CM | POA: Insufficient documentation

## 2018-09-12 DIAGNOSIS — W07XXXA Fall from chair, initial encounter: Secondary | ICD-10-CM | POA: Insufficient documentation

## 2018-09-12 DIAGNOSIS — Y998 Other external cause status: Secondary | ICD-10-CM | POA: Insufficient documentation

## 2018-09-12 DIAGNOSIS — S3992XA Unspecified injury of lower back, initial encounter: Secondary | ICD-10-CM | POA: Insufficient documentation

## 2018-09-12 DIAGNOSIS — Y9389 Activity, other specified: Secondary | ICD-10-CM | POA: Insufficient documentation

## 2018-09-12 DIAGNOSIS — Z7901 Long term (current) use of anticoagulants: Secondary | ICD-10-CM | POA: Insufficient documentation

## 2018-09-12 DIAGNOSIS — I10 Essential (primary) hypertension: Secondary | ICD-10-CM | POA: Insufficient documentation

## 2018-09-12 HISTORY — DX: Essential (primary) hypertension: I10

## 2018-09-12 MED ORDER — OXYCODONE-ACETAMINOPHEN 5-325 MG PO TABS
1.0000 | ORAL_TABLET | Freq: Once | ORAL | Status: AC
  Administered 2018-09-12: 1 via ORAL
  Filled 2018-09-12: qty 1

## 2018-09-12 MED ORDER — OXYCODONE-ACETAMINOPHEN 5-325 MG PO TABS: 1.0000 | ORAL_TABLET | ORAL | 0 refills | Status: DC | PRN

## 2018-09-12 NOTE — ED Provider Notes (Addendum)
[facility] Emergency Department Provider Note  ____________________________________________   I have reviewed the triage vital signs and the nursing notes. Where available I have reviewed prior notes and, if possible and indicated, outside hospital notes.    HISTORY  Chief Complaint Back Pain    HPI Lucas Yu is a 53 y.o. male   Who is not on any blood thinners, he states he fell on Friday and has pain in his lower right back.  He states he missed his chair and fell on his bottom while fishing.  He states he has had no numbness or weakness or incontinence of bowel or bladder no other complaints.  He states the pain is a sharp nonradiating discomfort.  No other alleviating or aggravating factors worse when he walks around Or touches it.  Past Medical History:  Diagnosis Date  . Hypertension     There are no active problems to display for this patient.   History reviewed. No pertinent surgical history.  Prior to Admission medications   Not on File    Allergies Penicillins  No family history on file.  Social History Social History   Tobacco Use  . Smoking status: Current Every Day Smoker  . Smokeless tobacco: Never Used  Substance Use Topics  . Alcohol use: Yes    Comment: Occasionally  . Drug use: Yes    Types: Marijuana    Review of Systems Constitutional: No fever/chills Eyes: No visual changes. ENT: No sore throat. No stiff neck no neck pain Cardiovascular: Denies chest pain. Respiratory: Denies shortness of breath. Gastrointestinal:   no vomiting.  No diarrhea.  No constipation. Genitourinary: Negative for dysuria. Musculoskeletal: Negative lower extremity swelling Skin: Negative for rash. Neurological: Negative for severe headaches, focal weakness or numbness.   ____________________________________________   PHYSICAL EXAM:  VITAL SIGNS: ED Triage Vitals  Enc Vitals Group     BP 09/12/18 0646 134/82     Pulse  Rate 09/12/18 0646 72     Resp 09/12/18 0646 20     Temp 09/12/18 0646 98.2 F (36.8 C)     Temp Source 09/12/18 0646 Oral     SpO2 09/12/18 0646 100 %     Weight 09/12/18 0647 180 lb (81.6 kg)     Height 09/12/18 0647 5\' 8"  (1.727 m)     Head Circumference --      Peak Flow --      Pain Score 09/12/18 0646 10     Pain Loc --      Pain Edu? --      Excl. in GC? --     Constitutional: Alert and oriented. Well appearing and in no acute distress. Head: Atraumatic Neck:   Nontender with no meningismus, no masses, no stridor Cardiovascular: Normal rate, regular rhythm. Grossly normal heart sounds.  Good peripheral circulation. Respiratory: Normal respiratory effort.  No retractions. Lungs CTAB. Abdominal: Soft and nontender. No distention. No guarding no rebound Back:  some tenderness to palpation the paraspinal regions around L5-S1, there is also some sacral discomfort.  No step-off no deformity.  Marland Kitchen there is no CVA tenderness No saddle anesthesia. Musculoskeletal: No lower extremity tenderness, no upper extremity tenderness. No joint effusions, no DVT signs strong distal pulses no edema no hip pain. Neurologic:  Normal speech and language. No gross focal neurologic deficits are appreciated.  Declines rectal exam. Skin:  Skin is warm, dry and intact. No rash noted.   ____________________________________________   LABS (all labs ordered  are listed, but only abnormal results are displayed)  Labs Reviewed - No data to display  Pertinent labs  results that were available during my care of the patient were reviewed by me and considered in my medical decision making (see chart for details). ____________________________________________  EKG  I personally interpreted any EKGs ordered by me or triage  ____________________________________________  RADIOLOGY  Pertinent labs & imaging results that were available during my care of the patient were reviewed by me and considered in my  medical decision making (see chart for details). If possible, patient and/or family made aware of any abnormal findings.  No results found. ____________________________________________    PROCEDURES  Procedure(s) performed: None  Procedures  Critical Care performed: None  ____________________________________________   INITIAL IMPRESSION / ASSESSMENT AND PLAN / ED COURSE  Pertinent labs & imaging results that were available during my care of the patient were reviewed by me and considered in my medical decision making (see chart for details).  Neurovascularly intact with no evidence of cauda equina syndrome after a non-syncopal fall injuring his low back.  We will obtain imaging given the fact that this was a traumatic event, we will give him pain medication, he is not driving.  We will reassess.  ----------------------------------------- 8:53 AM on 09/12/2018 ----------------------------------------- Patient with likely bruising to his bottom but no evidence of fracture, no evidence of cauda equina syndrome, no evidence of neurologic symptoms no evidence of radicular symptoms no evidence of referred abdominal pain, no red flags to suggest further imaging is indicated, remains neurologically intact, pain is reduced after pain medication.  Do believe that this to be uncomfortable.  Most likely has some deep bruising from his fall.  Extensive return precautions follow-up given understood.  Patient is very interested in a work note which we will provide.     ____________________________________________   FINAL CLINICAL IMPRESSION(S) / ED DIAGNOSES  Final diagnoses:  None      This chart was dictated using voice recognition software.  Despite best efforts to proofread,  errors can occur which can change meaning.      Jeanmarie Plant, MD 09/12/18 7654    Jeanmarie Plant, MD 09/12/18 [phone]

## 2018-09-12 NOTE — ED Notes (Signed)
Signature pad not working - printed paper copy and signature obtained (by Selena Batten)

## 2018-09-12 NOTE — ED Notes (Signed)
Pt verbalized understanding of discharge instructions. NAD at this time. 

## 2018-09-12 NOTE — ED Triage Notes (Signed)
Pt says he fell trying to sit on a chair on Friday; c/o lower back pain; pt says he landed on concrete; ambulatory with slow steady gait; talking in complete coherent sentences

## 2018-09-12 NOTE — Discharge Instructions (Signed)
Return to the emergency room for any new or worrisome symptoms including back pain that is worsening, numbness, weakness abdominal pain, incontinence, or accidental lease of your bowels or bladder or if you feel worse in any way.  Use the Percocet only for backup breakthrough pain, try treating this is best you can with ibuprofen and Tylenol.  Do not take Tylenol Percocet at same time.  Do not drive on Percocet.

## 2018-10-27 ENCOUNTER — Emergency Department
Admission: EM | Admit: 2018-10-27 | Discharge: 2018-10-27 | Disposition: A | Discharge status: Home / Self Care | Payer: Self-pay | Attending: Emergency Medicine | Admitting: Emergency Medicine

## 2018-10-27 ENCOUNTER — Encounter: Payer: Self-pay | Admitting: Emergency Medicine

## 2018-10-27 ENCOUNTER — Other Ambulatory Visit: Payer: Self-pay

## 2018-10-27 DIAGNOSIS — I10 Essential (primary) hypertension: Secondary | ICD-10-CM | POA: Insufficient documentation

## 2018-10-27 DIAGNOSIS — K047 Periapical abscess without sinus: Secondary | ICD-10-CM | POA: Insufficient documentation

## 2018-10-27 DIAGNOSIS — F17200 Nicotine dependence, unspecified, uncomplicated: Secondary | ICD-10-CM | POA: Insufficient documentation

## 2018-10-27 MED ORDER — CLINDAMYCIN HCL 300 MG PO CAPS: 300.0000 mg | ORAL_CAPSULE | Freq: Three times a day (TID) | ORAL | 0 refills | Status: AC

## 2018-10-27 MED ORDER — LIDOCAINE VISCOUS HCL 2 % MT SOLN: 10.0000 mL | OROMUCOSAL | 0 refills | Status: DC | PRN

## 2018-10-27 MED ORDER — DICLOFENAC POTASSIUM 50 MG PO TABS: 50.0000 mg | ORAL_TABLET | Freq: Three times a day (TID) | ORAL | 0 refills | Status: DC

## 2018-10-27 NOTE — Discharge Instructions (Signed)
OPTIONS FOR DENTAL FOLLOW UP CARE ° °[place] Department of Health and Human Services - Local Safety Net Dental Clinics °http://www.ncdhhs.gov/dph/oralhealth/services/safetynetclinics.htm °  °[facility] (336-562-3123) ° °[facility] [place] ([phone]) ° °[facility] ([phone] ext [phone]) ° °[facility] ([phone]) ° °[facility] ([phone]) °This clinic caters to the indigent population and is on a lottery system. °Location: °[facility], [address], [place] °Clinic Hours: °Wednesdays from 6pm - 9pm, patients seen by a lottery system. °For dates, call or go to [email or web address]/[facility]/patients/[email or web address] °Services: °Cleanings, fillings and simple extractions. °Payment Options: °DENTAL WORK IS FREE OF CHARGE. Bring proof of income or support. °Best way to get seen: °Arrive at 5:15 pm - this is a lottery, NOT first come/first serve, so arriving earlier will not increase your chances of being seen. °  °  °[facility] °[phone] °Select option 1 for emergencies °  °Location: °[facility], [address], [place] °Clinic Hours: °No walk-ins accepted - call the day before to schedule an appointment. °Check in times are 9:30 am and 1:30 pm. °Services: °Simple extractions, temporary fillings, pulpectomy/pulp debridement, uncomplicated abscess drainage. °Payment Options: °PAYMENT IS DUE AT THE TIME OF SERVICE.  Fee is usually $100-200, additional surgical procedures (e.g. abscess drainage) may be extra. °Cash, checks, Visa/MasterCard accepted.  Can file Medicaid if patient is covered for dental - patient should call case worker to check. °No discount for [facility] patients. °Best way to get seen: °MUST call the day before and get onto the schedule. Can usually be seen the next 1-2 days. No walk-ins accepted. °  °  °[facility] °[phone] °   °Location: °[facility], [address], [place] °Clinic Hours: °M, W, Th, F 8am or 1:30pm, Tues 9a or 1:30 - first come/first served. °Services: °Simple extractions, temporary fillings, uncomplicated abscess drainage.  You do not need to be an [place] resident. °Payment Options: °PAYMENT IS DUE AT THE TIME OF SERVICE. °Dental insurance, otherwise sliding scale - bring proof of income or support. °Depending on income and treatment needed, cost is usually $50-200. °Best way to get seen: °Arrive early as it is first come/first served. °  °  °[facility] °[phone] °  °Location: °[address] °Clinic Hours: °Mon-Thu 8a-5p °Services: °Most basic dental services including extractions and fillings. °Payment Options: °PAYMENT IS DUE AT THE TIME OF SERVICE. °Sliding scale, up to 50% off - bring proof if income or support. °Medicaid with dental option accepted. °Best way to get seen: °Call to schedule an appointment, can usually be seen within 2 weeks OR they will try to see walk-ins - show up at 8a or 2p (you may have to wait). °  °  °[facility] °919-245-2435 °[place] RESIDENTS ONLY °  °Location: °[facility], [address], [place], [place] [address] °Clinic Hours: By appointment only. °Monday - Thursday 8am-5pm, Friday 8am-12pm °Services: Cleanings, fillings, extractions. °Payment Options: °PAYMENT IS DUE AT THE TIME OF SERVICE. °Cash, Visa or MasterCard. Sliding scale - $30 minimum per service. °Best way to get seen: °Come in to office, complete packet and make an appointment - need proof of income °or support monies for each household member and proof of [place] residence. °Usually takes about a month to get in. °  °  °[facility] °[phone] °  °Location: °[address]   [place] °Clinic Hours: Walk-in Urgent Care Dental Services are offered Monday-Friday  mornings only. °The numbers of emergencies accepted daily is limited to the number of °providers available. °Maximum 15 - Mondays, Wednesdays & Thursdays °Maximum 10 - Tuesdays & Fridays °Services: °You do not need to be a [place] resident to be seen for a dental emergency. °Emergencies are defined as pain, swelling, abnormal bleeding, or dental trauma. Walkins will receive x-rays if needed. °NOTE: Dental cleaning is not an emergency. °Payment Options: °PAYMENT IS DUE AT THE TIME OF SERVICE. °Minimum co-pay is $40.00 for uninsured patients. °Minimum co-pay is $3.00 for Medicaid with dental coverage. °Dental Insurance is accepted and must be presented at time of visit. °Medicare does not cover dental. °Forms of payment: Cash, credit card, checks. °Best way to get seen: °If not previously registered with the clinic, walk-in dental registration begins at 7:15 am and is on a first come/first serve basis. °If previously registered with the clinic, call to make an appointment. °  °  °[facility] °[phone] °[place] RESIDENTS ONLY °  °Location: °[address], [place], [place] °Clinic Hours: °Mon-Thu 10a-2p °Services: Extractions only! °Payment Options: °FREE (donations accepted) - bring proof of income or support °Best way to get seen: °Call and schedule an appointment OR come at 8am on the 1st Monday of every month (except for holidays) when it is first come/first served. °  °  °[facility] °[phone] °  °Location: °[address], [place] °Clinic Hours: °Friday mornings °Services, Payment Options, Best way to get seen: °Call for info °

## 2018-10-27 NOTE — ED Notes (Signed)
See triage note  Presents with possible dental abscess  Pain and swelling to right upper gumline

## 2018-10-27 NOTE — ED Triage Notes (Signed)
Pt c/o dental pain and oral swelling since Sunday. Pt states unsure if dental abscess but states "I need something for pain".

## 2018-10-27 NOTE — ED Provider Notes (Signed)
[facility] Emergency Department Provider Note  ____________________________________________  Time seen: Approximately 1:28 PM  I have reviewed the triage vital signs and the nursing notes.   HISTORY  Chief Complaint Dental Pain    HPI Lucas Yu is a 53 y.o. male presents to the emergency department for evaluation of right upper dental pain for 4 days.  Pain is primarily to his right cheek near his nose.  Patient does not have a dentist.  He has missed all week due to pain.  He states that pain is worse when he walks because when he takes a step, it jars the pain.  No fevers.   Past Medical History:  Diagnosis Date  . Hypertension     There are no active problems to display for this patient.   History reviewed. No pertinent surgical history.  Prior to Admission medications   Medication Sig Start Date End Date Taking? Authorizing Provider  clindamycin (CLEOCIN) 300 MG capsule Take 1 capsule (300 mg total) by mouth 3 (three) times daily for 10 days. 10/27/18 11/06/18  Enid DerryWagner, Nelvin Tomb, PA-C  diclofenac (CATAFLAM) 50 MG tablet Take 1 tablet (50 mg total) by mouth 3 (three) times daily. 10/27/18   Enid DerryWagner, Mea Ozga, PA-C  lidocaine (XYLOCAINE) 2 % solution Use as directed 10 mLs in the mouth or throat as needed. 10/27/18   Enid DerryWagner, Aeson Sawyers, PA-C    Allergies Penicillins  No family history on file.  Social History Social History   Tobacco Use  . Smoking status: Current Every Day Smoker  . Smokeless tobacco: Never Used  Substance Use Topics  . Alcohol use: Yes    Comment: Occasionally  . Drug use: Yes    Types: Marijuana     Review of Systems  Constitutional: No fever/chills Gastrointestinal: No nausea, no vomiting.  Musculoskeletal: Negative for musculoskeletal pain. Skin: Negative for rash, abrasions, lacerations, ecchymosis. Neurological: Negative for headaches   ____________________________________________   PHYSICAL EXAM:  VITAL  SIGNS: ED Triage Vitals  Enc Vitals Group     BP 10/27/18 1306 124/90     Pulse Rate 10/27/18 1306 63     Resp 10/27/18 1306 18     Temp 10/27/18 1306 98.4 F (36.9 C)     Temp Source 10/27/18 1306 Oral     SpO2 10/27/18 1306 98 %     Weight 10/27/18 1303 170 lb (77.1 kg)     Height 10/27/18 1303 5\' 8"  (1.727 m)     Head Circumference --      Peak Flow --      Pain Score 10/27/18 1303 10     Pain Loc --      Pain Edu? --      Excl. in GC? --      Constitutional: Alert and oriented. Well appearing and in no acute distress. Eyes: Conjunctivae are normal. PERRL. EOMI. Head: Atraumatic. ENT:      Ears:      Nose: No congestion/rhinnorhea.      Mouth/Throat: Mucous membranes are moist.  Tenderness to palpation to tooth #4.  No visible swelling.  Full range of motion of jaw. Neck: No stridor. Cardiovascular: Normal rate, regular rhythm.  Good peripheral circulation. Respiratory: Normal respiratory effort without tachypnea or retractions. Lungs CTAB. Good air entry to the bases with no decreased or absent breath sounds. Musculoskeletal: Full range of motion to all extremities. No gross deformities appreciated. Neurologic:  Normal speech and language. No gross focal neurologic deficits are appreciated.  Skin:  Skin is warm, dry and intact. No rash noted. Psychiatric: Mood and affect are normal. Speech and behavior are normal. Patient exhibits appropriate insight and judgement.   ____________________________________________   LABS (all labs ordered are listed, but only abnormal results are displayed)  Labs Reviewed - No data to display ____________________________________________  EKG   ____________________________________________  RADIOLOGY   No results found.  ____________________________________________    PROCEDURES  Procedure(s) performed:    Procedures    Medications - No data to display   ____________________________________________   INITIAL  IMPRESSION / ASSESSMENT AND PLAN / ED COURSE  Pertinent labs & imaging results that were available during my care of the patient were reviewed by me and considered in my medical decision making (see chart for details).  Review of the [place] CSRS was performed in accordance of the [facility] prior to dispensing any controlled drugs.     Patient's diagnosis is consistent with dental infection.  Patient declines IM pain medications.  Patient will be discharged home with prescriptions for clindamycin, diclofenac, viscous lidocaine. Patient is to follow up with dentist as directed.  Dental resources were given.  Patient is given ED precautions to return to the ED for any worsening or new symptoms.    ____________________________________________  FINAL CLINICAL IMPRESSION(S) / ED DIAGNOSES  Final diagnoses:  Dental infection      NEW MEDICATIONS STARTED DURING THIS VISIT:  ED Discharge Orders         Ordered    diclofenac (CATAFLAM) 50 MG tablet  3 times daily     10/27/18 1341    lidocaine (XYLOCAINE) 2 % solution  As needed     10/27/18 1341    clindamycin (CLEOCIN) 300 MG capsule  3 times daily     10/27/18 1341              This chart was dictated using voice recognition software/Dragon. Despite best efforts to proofread, errors can occur which can change the meaning. Any change was purely unintentional.    Enid Derry, PA-C 10/27/18 1511    Jeanmarie Plant, MD 10/27/18 [phone]

## 2018-12-16 ENCOUNTER — Other Ambulatory Visit: Payer: Self-pay

## 2018-12-16 ENCOUNTER — Emergency Department
Admission: EM | Admit: 2018-12-16 | Discharge: 2018-12-16 | Discharge status: Against Medical Advice | Payer: BC Managed Care – PPO

## 2018-12-16 NOTE — ED Notes (Signed)
This RN notified by registration that patient left after being registered due to no visitors.

## 2019-03-28 ENCOUNTER — Encounter: Payer: Self-pay | Admitting: Emergency Medicine

## 2019-03-28 ENCOUNTER — Other Ambulatory Visit: Payer: Self-pay

## 2019-03-28 ENCOUNTER — Emergency Department
Admission: EM | Admit: 2019-03-28 | Discharge: 2019-03-28 | Disposition: A | Discharge status: Home / Self Care | Payer: BC Managed Care – PPO | Attending: Emergency Medicine | Admitting: Emergency Medicine

## 2019-03-28 DIAGNOSIS — F17210 Nicotine dependence, cigarettes, uncomplicated: Secondary | ICD-10-CM | POA: Insufficient documentation

## 2019-03-28 DIAGNOSIS — I10 Essential (primary) hypertension: Secondary | ICD-10-CM | POA: Insufficient documentation

## 2019-03-28 DIAGNOSIS — M25531 Pain in right wrist: Secondary | ICD-10-CM

## 2019-03-28 DIAGNOSIS — Z880 Allergy status to penicillin: Secondary | ICD-10-CM | POA: Insufficient documentation

## 2019-03-28 DIAGNOSIS — M13131 Monoarthritis, not elsewhere classified, right wrist: Secondary | ICD-10-CM | POA: Insufficient documentation

## 2019-03-28 DIAGNOSIS — Z79899 Other long term (current) drug therapy: Secondary | ICD-10-CM | POA: Insufficient documentation

## 2019-03-28 MED ORDER — NAPROXEN 500 MG PO TABS
500.0000 mg | ORAL_TABLET | Freq: Once | ORAL | Status: AC
  Administered 2019-03-28: 500 mg via ORAL
  Filled 2019-03-28: qty 1

## 2019-03-28 MED ORDER — PREDNISONE 20 MG PO TABS
50.0000 mg | ORAL_TABLET | Freq: Once | ORAL | Status: AC
  Administered 2019-03-28: 50 mg via ORAL
  Filled 2019-03-28: qty 2

## 2019-03-28 MED ORDER — PREDNISONE 50 MG PO TABS: 50.0000 mg | ORAL_TABLET | Freq: Every day | ORAL | 0 refills | Status: DC

## 2019-03-28 MED ORDER — NAPROXEN 500 MG PO TABS: 500.0000 mg | ORAL_TABLET | Freq: Two times a day (BID) | ORAL | 2 refills | Status: DC

## 2019-03-28 NOTE — ED Notes (Signed)
Pt presents to ED with c/o "pain and swelling" in right wrist that started over the weekend. No swelling to wrist noted at this time. Pt appears in NAD. MD assessed pt prior to this RN arrival and assessment of pt and pt up for discharge.

## 2019-03-28 NOTE — ED Notes (Signed)
Pt alert and oriented X4. NAD noted 

## 2019-03-28 NOTE — ED Provider Notes (Signed)
[facility] Emergency Department Provider Note   ____________________________________________    I have reviewed the triage vital signs and the nursing notes.   HISTORY  Chief Complaint Joint Pain     HPI Lucas Yu is a 53 y.o. male with history of hypertension who presents with complaints primarily of right wrist pain.  Patient reports he was working on Friday and started to have soreness in his right wrist, he does work at a [employer].  He states he uses his hands a lot.  He reports his pain became more significant and he had mild swelling in his wrist.  Also complains of some mild soreness in his right ankle and left wrist as well however no swelling he has been wearing a Ace wrap with some improvement.  He does not take anything for this.  Denies fevers or chills.  No myalgias.  No cough or shortness of breath or COVID symptoms.  No sick contacts reported.  Does not report a history of gout  Past Medical History:  Diagnosis Date  . Hypertension     There are no active problems to display for this patient.   History reviewed. No pertinent surgical history.  Prior to Admission medications   Medication Sig Start Date End Date Taking? Authorizing Provider  diclofenac (CATAFLAM) 50 MG tablet Take 1 tablet (50 mg total) by mouth 3 (three) times daily. 10/27/18   Enid Derry, PA-C  lidocaine (XYLOCAINE) 2 % solution Use as directed 10 mLs in the mouth or throat as needed. 10/27/18   Enid Derry, PA-C  naproxen (NAPROSYN) 500 MG tablet Take 1 tablet (500 mg total) by mouth 2 (two) times daily with a meal. 03/28/19   Jene Every, MD  predniSONE (DELTASONE) 50 MG tablet Take 1 tablet (50 mg total) by mouth daily with breakfast. 03/28/19   Jene Every, MD     Allergies Penicillins  No family history on file.  Social History Social History   Tobacco Use  . Smoking status: Current Every Day Smoker  . Smokeless tobacco: Never Used   Substance Use Topics  . Alcohol use: Yes    Comment: Occasionally  . Drug use: Yes    Types: Marijuana    Review of Systems  Constitutional: No fever  ENT: No sore throat   Gastrointestinal:No nausea, no vomiting.    Musculoskeletal: As above Skin: No rash Neurological: Negative for headaches     ____________________________________________   PHYSICAL EXAM:  VITAL SIGNS: ED Triage Vitals  Enc Vitals Group     BP 03/28/19 0705 118/89     Pulse Rate 03/28/19 0705 72     Resp 03/28/19 0705 16     Temp 03/28/19 0705 98.4 F (36.9 C)     Temp Source 03/28/19 0705 Oral     SpO2 03/28/19 0705 95 %     Weight 03/28/19 0705 77.1 kg (170 lb)     Height 03/28/19 0705 1.727 m (5\' 8" )     Head Circumference --      Peak Flow --      Pain Score 03/28/19 0709 10     Pain Loc --      Pain Edu? --      Excl. in GC? --      Constitutional: Alert and oriented. No acute distress. Pleasant and interactive Eyes: Conjunctivae are normal.  Head: Atraumatic. Nose: No congestion/rhinnorhea. Mouth/Throat: Mucous membranes are moist.   Cardiovascular: Normal rate, regular rhythm.  Respiratory: Normal respiratory effort.  No retractions.  Clear auscultation bilaterally  Musculoskeletal: Right wrist: Minimal swelling, no erythema, no puncture wounds or evidence of cellulitis or infection.  Discomfort with range of motion although tolerates it.  Fingers normal, no evidence of tenosynovitis.  Other joints unremarkable exam Neurologic:  Normal speech and language. No gross focal neurologic deficits are appreciated.   Skin:  Skin is warm, dry and intact. No rash noted.   ____________________________________________   LABS (all labs ordered are listed, but only abnormal results are displayed)  Labs Reviewed - No data to display ____________________________________________  EKG   ____________________________________________  RADIOLOGY  None  ____________________________________________   PROCEDURES  Procedure(s) performed: No  Procedures   Critical Care performed: No ____________________________________________   INITIAL IMPRESSION / ASSESSMENT AND PLAN / ED COURSE  Pertinent labs & imaging results that were available during my care of the patient were reviewed by me and considered in my medical decision making (see chart for details).  Patient with soreness to the right wrist, no evidence of infection or significant swelling or erythema.  Possible work overuse injury, less likely gout, not consistent with infectious etiology.  Will splint the right wrist, treat with NSAIDs, trial course of prednisone   ____________________________________________   FINAL CLINICAL IMPRESSION(S) / ED DIAGNOSES  Final diagnoses:  Arthralgia of right wrist      NEW MEDICATIONS STARTED DURING THIS VISIT:  New Prescriptions   NAPROXEN (NAPROSYN) 500 MG TABLET    Take 1 tablet (500 mg total) by mouth 2 (two) times daily with a meal.   PREDNISONE (DELTASONE) 50 MG TABLET    Take 1 tablet (50 mg total) by mouth daily with breakfast.     Note:  This document was prepared using Dragon voice recognition software and may include unintentional dictation errors.   Lavonia Drafts, MD 03/28/19 [phone]

## 2019-03-28 NOTE — ED Triage Notes (Signed)
Pt reports pain in his joints on his hands and right ankle since Friday. Pt denies fevers, SOB, CP or other symptoms.

## 2019-05-26 ENCOUNTER — Emergency Department
Admission: EM | Admit: 2019-05-26 | Discharge: 2019-05-26 | Disposition: A | Discharge status: Home / Self Care | Payer: Self-pay | Attending: Emergency Medicine | Admitting: Emergency Medicine

## 2019-05-26 ENCOUNTER — Emergency Department: Payer: Self-pay

## 2019-05-26 ENCOUNTER — Other Ambulatory Visit: Payer: Self-pay

## 2019-05-26 DIAGNOSIS — M129 Arthropathy, unspecified: Secondary | ICD-10-CM

## 2019-05-26 DIAGNOSIS — Z79899 Other long term (current) drug therapy: Secondary | ICD-10-CM | POA: Insufficient documentation

## 2019-05-26 DIAGNOSIS — F17210 Nicotine dependence, cigarettes, uncomplicated: Secondary | ICD-10-CM | POA: Insufficient documentation

## 2019-05-26 DIAGNOSIS — I10 Essential (primary) hypertension: Secondary | ICD-10-CM | POA: Insufficient documentation

## 2019-05-26 DIAGNOSIS — M13111 Monoarthritis, not elsewhere classified, right shoulder: Secondary | ICD-10-CM | POA: Insufficient documentation

## 2019-05-26 MED ORDER — NAPROXEN 500 MG PO TABS
500.0000 mg | ORAL_TABLET | Freq: Once | ORAL | Status: AC
  Administered 2019-05-26: 21:00:00 500 mg via ORAL
  Filled 2019-05-26: qty 1

## 2019-05-26 MED ORDER — MELOXICAM 7.5 MG PO TABS
15.0000 mg | ORAL_TABLET | Freq: Once | ORAL | Status: AC
  Administered 2019-05-26: 15 mg via ORAL
  Filled 2019-05-26 (×2): qty 2

## 2019-05-26 MED ORDER — NAPROXEN 500 MG PO TABS: 500.0000 mg | ORAL_TABLET | Freq: Two times a day (BID) | ORAL | 2 refills | Status: DC

## 2019-05-26 MED ORDER — PREDNISONE 20 MG PO TABS
60.0000 mg | ORAL_TABLET | Freq: Once | ORAL | Status: AC
  Administered 2019-05-26: 21:00:00 60 mg via ORAL
  Filled 2019-05-26: qty 3

## 2019-05-26 MED ORDER — PREDNISONE 50 MG PO TABS: 50.0000 mg | ORAL_TABLET | Freq: Every day | ORAL | 0 refills | Status: DC

## 2019-05-26 NOTE — ED Provider Notes (Signed)
[facility] Emergency Department Provider Note  ____________________________________________  Time seen: Approximately 7:44 PM  The following is a medical screening exam note. It is intended that the patient await an ER room assignment for detailed exam, diagnosis, and disposition.  I have reviewed the triage vital signs.    HISTORY  Chief Complaint No chief complaint on file.   HPI Lucas Yu is a 53 y.o. male presents for right shoulder pain. Symptoms have been present for several days. Worse after the last few days. No alleviating measures except ibuprofen day before yesterday. No recent injury.   Past Medical History:  Diagnosis Date  . Hypertension     There are no active problems to display for this patient.   No past surgical history on file.  Prior to Admission medications   Medication Sig Start Date End Date Taking? Authorizing Provider  diclofenac (CATAFLAM) 50 MG tablet Take 1 tablet (50 mg total) by mouth 3 (three) times daily. 10/27/18   Enid Derry, PA-C  lidocaine (XYLOCAINE) 2 % solution Use as directed 10 mLs in the mouth or throat as needed. 10/27/18   Enid Derry, PA-C  naproxen (NAPROSYN) 500 MG tablet Take 1 tablet (500 mg total) by mouth 2 (two) times daily with a meal. 03/28/19   Jene Every, MD  predniSONE (DELTASONE) 50 MG tablet Take 1 tablet (50 mg total) by mouth daily with breakfast. 03/28/19   Jene Every, MD    Allergies Penicillins  No family history on file.  Social History Social History   Tobacco Use  . Smoking status: Current Every Day Smoker  . Smokeless tobacco: Never Used  Substance Use Topics  . Alcohol use: Yes    Comment: Occasionally  . Drug use: Yes    Types: Marijuana    Review of Systems Constitutional: Negative for fever. ENT: Negative  for sore throat. Respiratory: Negative for cough. Gastrointestinal: No abdominal pain.  No nausea, no vomiting.  No diarrhea.  Musculoskeletal:  Negative  for generalized body aches. Positive for right shoulder pain. Skin: Negataive for rash/lesion/wound. Neurological: Negative for headaches, focal weakness or numbness.  ____________________________________________   PHYSICAL EXAM:  VITAL SIGNS:  Today's Vitals   05/26/19 1946 05/26/19 1947 05/26/19 1951 05/26/19 2034  BP: 115/67     Pulse: (!) 58     Resp: 20     Temp: 98.6 F (37 C)     SpO2: 100%     Weight:   79.4 kg   Height:   5\' 8"  (1.727 m)   PainSc:  10-Worst pain ever  5    Body mass index is 26.61 kg/m.   Constitutional: Alert and oriented. No acute distress. Head: Atraumatic. Nose: No congestion/rhinnorhea. Mouth/Throat: Mucous membranes are moist. Neck: No stridor.  Cardiovascular: Good peripheral circulation. Respiratory: Normal respiratory effort. Musculoskeletal: Limited external rotation and abduction of the right shoulder. Diffuse tenderness to palpation. Neurologic:  Normal speech and language. No gross focal neurologic deficits are appreciated. Speech is normal. No gait instability. Skin:  Skin is warm, dry and intact. No rash noted. Psychiatric: Mood and affect are normal. Speech and behavior are normal.  ____________________________________________   LABS (all labs ordered are listed, but only abnormal results are displayed)  Labs Reviewed - No data to display ____________________________________________  EKG  Not indicated. ____________________________________________   INITIAL CLINICAL IMPRESSION(S)   53 year old male presents for right shoulder pain without specific injury. He reports repeat motion at work. Will x-ray. Asked patient  to wait in waiting room until ER room is open.   Victorino Dike, FNP 05/26/19 2114    Delman Kitten, MD 05/26/19 2115

## 2019-05-26 NOTE — ED Triage Notes (Signed)
PT in with co right shoulder pain for a long time states does a lot of physical work. Has worsened over the last few days, no known injury. Took ibuprofen at home with some relief.

## 2019-09-21 ENCOUNTER — Emergency Department
Admission: EM | Admit: 2019-09-21 | Discharge: 2019-09-21 | Disposition: A | Discharge status: Home / Self Care | Payer: Self-pay | Attending: Emergency Medicine | Admitting: Emergency Medicine

## 2019-09-21 ENCOUNTER — Other Ambulatory Visit: Payer: Self-pay

## 2019-09-21 ENCOUNTER — Emergency Department: Payer: Self-pay

## 2019-09-21 DIAGNOSIS — F17210 Nicotine dependence, cigarettes, uncomplicated: Secondary | ICD-10-CM | POA: Insufficient documentation

## 2019-09-21 DIAGNOSIS — I10 Essential (primary) hypertension: Secondary | ICD-10-CM | POA: Insufficient documentation

## 2019-09-21 DIAGNOSIS — M25512 Pain in left shoulder: Secondary | ICD-10-CM | POA: Insufficient documentation

## 2019-09-21 MED ORDER — NAPROXEN 500 MG PO TABS: 500.0000 mg | ORAL_TABLET | Freq: Two times a day (BID) | ORAL | 0 refills | Status: DC

## 2019-09-21 NOTE — ED Triage Notes (Signed)
Pt c/o left shoulder pain for the past week, denies injury. States "I think I have arthritis"

## 2019-09-21 NOTE — ED Provider Notes (Signed)
[facility] Emergency Department Provider Note  ____________________________________________   None    (approximate)  I have reviewed the triage vital signs and the nursing notes.   HISTORY  Chief Complaint Shoulder Pain   HPI Lucas Yu is a 54 y.o. male presents to the ED with complaint of left shoulder pain for the past week.  Patient states that there is been no history of injury but states he does a lot of repetitive motion at work.  He denies any recent injury.  He states he has not taken any over-the-counter medication.  His belief is that this is caused by arthritis.  He rates his pain as an 8 out of 10.       Past Medical History:  Diagnosis Date  . Hypertension     There are no problems to display for this patient.   History reviewed. No pertinent surgical history.  Prior to Admission medications   Medication Sig Start Date End Date Taking? Authorizing Provider  naproxen (NAPROSYN) 500 MG tablet Take 1 tablet (500 mg total) by mouth 2 (two) times daily with a meal. 09/21/19   Johnn Hai, PA-C    Allergies Penicillins  No family history on file.  Social History Social History   Tobacco Use  . Smoking status: Current Every Day Smoker  . Smokeless tobacco: Never Used  Substance Use Topics  . Alcohol use: Yes    Comment: Occasionally  . Drug use: Yes    Types: Marijuana    Review of Systems Constitutional: No fever/chills Eyes: No visual changes. ENT: No complaints. Cardiovascular: Denies chest pain. Respiratory: Denies shortness of breath. Gastrointestinal: No abdominal pain.  No nausea, no vomiting. Musculoskeletal: Positive for left shoulder pain. Skin: Negative for rash. Neurological: Negative for headaches, focal weakness or numbness. ____________________________________________   PHYSICAL EXAM:  VITAL SIGNS: ED Triage Vitals  Enc Vitals Group     BP 09/21/19 0702 114/70     Pulse Rate 09/21/19  0702 79     Resp 09/21/19 0702 16     Temp 09/21/19 0703 98.3 F (36.8 C)     Temp Source 09/21/19 0702 Oral     SpO2 09/21/19 0702 98 %     Weight 09/21/19 0703 170 lb (77.1 kg)     Height 09/21/19 0703 5\' 7"  (1.702 m)     Head Circumference --      Peak Flow --      Pain Score 09/21/19 0703 8     Pain Loc --      Pain Edu? --      Excl. in [place]? --    Constitutional: Alert and oriented. Well appearing and in no acute distress. Eyes: Conjunctivae are normal.  Head: Atraumatic. Nose: No congestion/rhinnorhea. Neck: No stridor.   Cardiovascular: Normal rate, regular rhythm. Grossly normal heart sounds.  Good peripheral circulation. Respiratory: Normal respiratory effort.  No retractions. Lungs CTAB. Gastrointestinal: Soft and nontender. No distention. Musculoskeletal: On examination of left shoulder there is no gross deformity however patient is moderately tender diffusely both anteriorly and posteriorly.  There is little to no crepitus with range of motion of his shoulder.  No soft tissue edema or warmth is noted.  No point tenderness on palpation of the clavicle.  Good muscle strength bilaterally.  Skin is intact. Neurologic:  Normal speech and language. No gross focal neurologic deficits are appreciated. No gait instability. Skin:  Skin is warm, dry and intact. No rash noted. Psychiatric:  Mood and affect are normal. Speech and behavior are normal.  ____________________________________________   LABS (all labs ordered are listed, but only abnormal results are displayed)  Labs Reviewed - No data to display RADIOLOGY   Official radiology report(s): DG Shoulder Left  Result Date: 09/21/2019 CLINICAL DATA:  Anterior left shoulder pain for 1 week EXAM: LEFT SHOULDER - 2+ VIEW COMPARISON:  None. FINDINGS: There is no evidence of fracture or dislocation. Minimal arthropathy of the [facility] joint. Glenohumeral joint appears within normal limits. Soft tissues are unremarkable. IMPRESSION: No  acute abnormality. Minimal arthropathy of the [facility] joint. Electronically Signed   By: Duanne Guess D.O.   On: 09/21/2019 08:00    ____________________________________________   PROCEDURES  Procedure(s) performed (including Critical Care):  Procedures ____________________________________________   INITIAL IMPRESSION / ASSESSMENT AND PLAN / ED COURSE  As part of my medical decision making, I reviewed the following data within the electronic MEDICAL RECORD NUMBER Notes from prior ED visits and [place] Controlled Substance Database  54 year old male presents to the ED with complaint of left shoulder pain for the last week.  Patient denies any recent injury has not been take any over-the-counter medication.  Patient also stated that he needed a note to return to work as he left due to his shoulder pain.  Physical exam shows diffuse tenderness to light touch both anterior and posterior shoulder.  No crepitus is noted with range of motion.  X-rays were negative for acute bony injury and minimal osteoarthritis changes were noted.  Patient was given prescription for naproxen 500 mg twice daily with food.  He is to follow-up with his PCP if any continued problems.  He was given a note to return to work tomorrow.  ____________________________________________   FINAL CLINICAL IMPRESSION(S) / ED DIAGNOSES  Final diagnoses:  Acute pain of left shoulder     ED Discharge Orders         Ordered    naproxen (NAPROSYN) 500 MG tablet  2 times daily with meals     09/21/19 0810           Note:  This document was prepared using Dragon voice recognition software and may include unintentional dictation errors.    Tommi Rumps, PA-C 09/21/19 1133    Minna Antis, MD 09/21/19 1402

## 2019-09-21 NOTE — ED Notes (Signed)
See triage note   Presents with left shoulder pain  States he has not had an injury  But does a lot of repetitive motion at work no deformity noted

## 2019-09-21 NOTE — Discharge Instructions (Signed)
Call the clinics listed on your discharge papers to see if they are taking new patients.  Also the [facility]-door clinic is available to you and is free.  You may use ice or heat to your shoulder as needed for discomfort.  Begin taking naproxen 500 mg twice daily with food.

## 2019-10-10 ENCOUNTER — Emergency Department
Admission: EM | Admit: 2019-10-10 | Discharge: 2019-10-10 | Disposition: A | Discharge status: Home / Self Care | Payer: Self-pay | Attending: Emergency Medicine | Admitting: Emergency Medicine

## 2019-10-10 ENCOUNTER — Other Ambulatory Visit: Payer: Self-pay

## 2019-10-10 ENCOUNTER — Emergency Department: Payer: Self-pay

## 2019-10-10 ENCOUNTER — Encounter: Payer: Self-pay | Admitting: Emergency Medicine

## 2019-10-10 DIAGNOSIS — I10 Essential (primary) hypertension: Secondary | ICD-10-CM | POA: Insufficient documentation

## 2019-10-10 DIAGNOSIS — F17200 Nicotine dependence, unspecified, uncomplicated: Secondary | ICD-10-CM | POA: Insufficient documentation

## 2019-10-10 DIAGNOSIS — M8700 Idiopathic aseptic necrosis of unspecified bone: Secondary | ICD-10-CM | POA: Insufficient documentation

## 2019-10-10 DIAGNOSIS — M25552 Pain in left hip: Secondary | ICD-10-CM | POA: Insufficient documentation

## 2019-10-10 DIAGNOSIS — F1210 Cannabis abuse, uncomplicated: Secondary | ICD-10-CM | POA: Insufficient documentation

## 2019-10-10 MED ORDER — TRAMADOL HCL 50 MG PO TABS: 50.0000 mg | ORAL_TABLET | Freq: Four times a day (QID) | ORAL | 0 refills | Status: DC | PRN

## 2019-10-10 MED ORDER — NAPROXEN 500 MG PO TABS: 500.0000 mg | ORAL_TABLET | Freq: Two times a day (BID) | ORAL | Status: DC

## 2019-10-10 NOTE — Discharge Instructions (Signed)
Follow discharge care instruction take medication as directed you must follow-up orthopedic for definitive evaluation and treatment of this condition.
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
## 2019-10-10 NOTE — ED Triage Notes (Signed)
Reports pain to left hip radiating down left leg. Denies injury.

## 2019-11-24 ENCOUNTER — Ambulatory Visit: Payer: Self-pay | Admitting: Gerontology

## 2019-11-24 ENCOUNTER — Encounter: Payer: Self-pay | Admitting: Gerontology

## 2019-11-24 ENCOUNTER — Other Ambulatory Visit: Payer: Self-pay

## 2019-11-24 VITALS — BP 129/82 | HR 73 | Ht 67.0 in | Wt 147.0 lb

## 2019-11-24 DIAGNOSIS — Z7689 Persons encountering health services in other specified circumstances: Secondary | ICD-10-CM | POA: Insufficient documentation

## 2019-11-24 DIAGNOSIS — F17200 Nicotine dependence, unspecified, uncomplicated: Secondary | ICD-10-CM

## 2019-11-24 DIAGNOSIS — R200 Anesthesia of skin: Secondary | ICD-10-CM

## 2019-11-24 DIAGNOSIS — M25552 Pain in left hip: Secondary | ICD-10-CM | POA: Insufficient documentation

## 2019-11-24 MED ORDER — IBUPROFEN 800 MG PO TABS: 800.0000 mg | ORAL_TABLET | Freq: Two times a day (BID) | ORAL | 0 refills | Status: DC

## 2019-11-24 MED ORDER — GABAPENTIN 100 MG PO CAPS: 200.0000 mg | ORAL_CAPSULE | Freq: Every day | ORAL | 0 refills | Status: DC

## 2019-11-24 MED ORDER — OMEPRAZOLE 20 MG PO CPDR: 20.0000 mg | DELAYED_RELEASE_CAPSULE | Freq: Every day | ORAL | 0 refills | Status: DC

## 2019-11-24 NOTE — Progress Notes (Signed)
Patient ID: Lucas Yu, male   DOB: December 04, 1965, 54 y.o.   MRN: [id number]  Chief Complaint  Patient presents with  . Establish Care    HPI Lucas Yu is a 54 y.o. male who presents to establish care and evaluation of his chronic conditions. He continues to experience constant radiating 9/10 sharp pain to left hip. He describes pain as sharp with movement, radiating to his leg, and taking Tramadol and Naproxen minimally relieve his symptoms.. He also experiences intermittent numbness and tingling to his leg. He denies any motor weakness. He has a limping gait to left leg and ambulates with a cane. 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 Degenerative changes are also possibilities. 2. Lower lumbar spine degenerative changes.3. No acute abnormality per Dr Darden Dates. He was advised to follow up with Orthopedic. He states that he has not had Colonoscopy done and he smokes 1/2 pack of cigarette daily and admits the desire to quit. Overall, he states that he's doing well and offers no further complaint.   Past Medical History:  Diagnosis Date  . Hypertension     History reviewed. No pertinent surgical history.  History reviewed. No pertinent family history.  Social History Social History   Tobacco Use  . Smoking status: Current Every Day Smoker    Packs/day: 0.50    Types: Cigarettes  . Smokeless tobacco: Never Used  Substance Use Topics  . Alcohol use: Yes    Comment: Occasionally  . Drug use: Yes    Types: Marijuana    Allergies  Allergen Reactions  . Penicillins     Has patient had a PCN reaction causing immediate rash, facial/tongue/throat swelling, SOB or lightheadedness with hypotension: No Has patient had a PCN reaction causing severe rash involving mucus membranes or skin necrosis: No Has patient had a PCN reaction that required hospitalization: No Has  patient had a PCN reaction occurring within the last 10 years: No If all of the above answers are "NO", then may proceed with Cephalosporin use.     Current Outpatient Medications  Medication Sig Dispense Refill  . traMADol (ULTRAM) 50 MG tablet Take 1 tablet (50 mg total) by mouth every 6 (six) hours as needed. 20 tablet 0  . gabapentin (NEURONTIN) 100 MG capsule Take 2 capsules (200 mg total) by mouth at bedtime. 60 capsule 0  . ibuprofen (ADVIL) 800 MG tablet Take 1 tablet (800 mg total) by mouth in the morning and at bedtime. 60 tablet 0  . omeprazole (PRILOSEC) 20 MG capsule Take 1 capsule (20 mg total) by mouth daily. 30 capsule 0   No current facility-administered medications for this visit.    Review of Systems Review of Systems  Constitutional: Negative.   HENT: Negative.   Eyes: Negative.   Respiratory: Negative.   Cardiovascular: Negative.   Gastrointestinal: Negative.   Endocrine: Negative.   Genitourinary: Negative.   Musculoskeletal: Positive for arthralgias (Left hip pain).  Skin: Negative.   Allergic/Immunologic: Negative.   Neurological: Positive for numbness (to leg with hip pain).  Hematological: Negative.   Psychiatric/Behavioral: Negative.     Blood pressure 129/82, pulse 73, height _0  (1.702 m), weight 147 lb (66.7 kg), SpO2 97 %.  Physical Exam Physical Exam Constitutional:      Appearance: Normal appearance.  HENT:     Head: Normocephalic and atraumatic.  Nose:     Comments: Deferred per Covid protocol    Mouth/Throat:     Comments: Deferred per Covid protocol Eyes:     Extraocular Movements: Extraocular movements intact.     Pupils: Pupils are equal, round, and reactive to light.  Cardiovascular:     Rate and Rhythm: Normal rate and regular rhythm.     Pulses: Normal pulses.     Heart sounds: Normal heart sounds.  Pulmonary:     Effort: Pulmonary effort is normal.     Breath sounds: Normal breath sounds.  Abdominal:     General:  Abdomen is flat. Bowel sounds are normal.     Palpations: Abdomen is soft.  Genitourinary:    Comments: Deferred per patient Musculoskeletal:        General: Tenderness (left hip with changing position to sittiing and standing.) present.     Cervical back: Normal range of motion.  Skin:    General: Skin is warm and dry.  Neurological:     General: No focal deficit present.     Mental Status: He is alert and oriented to person, place, and time. Mental status is at baseline.  Psychiatric:        Mood and Affect: Mood normal.        Behavior: Behavior normal.        Thought Content: Thought content normal.        Judgment: Judgment normal.     Data Reviewed Lab and past medical history was reviewed.  Assessment and Plan   1. Encounter to establish care - Routine labs will be checked. - He was encouraged to complete [facility] financial application for  Ambulatory referral to Gastroenterology for Colonoscopy screening. - Comp Met (CMET); Future - Lipid panel; Future - CBC w/Diff; Future - HgB A1c; Future - Urinalysis; Future - TSH; Future - PSA; Future  2. Left hip pain  - He was encouraged to complete [facility] financial application for Ambulatory referral to Orthopedic Surgery - He was educated on medication side effects and advised to notify clinic.ibuprofen (ADVIL) 800 MG tablet; Take 1 tablet (800 mg total) by mouth in the morning and at bedtime.  Dispense: 60 tablet; Refill: 0 - For prophylaxis omeprazole (PRILOSEC) 20 MG capsule; Take 1 capsule (20 mg total) by mouth daily.  Dispense: 30 capsule; Refill: 0  3. Numbness and tingling of left leg - He will continue on gabapentin, was educated on medication side effects and advised to notify clinic. - gabapentin (NEURONTIN) 100 MG capsule; Take 2 capsules (200 mg total) by mouth at bedtime.  Dispense: 60 capsule; Refill: 0  4. Smoking - He was advised on smoking cessation, provided with [place] Quitline information and he will follow  up - Ambulatory referral to Hematology / Oncology for Low Dose Lung CT scan.   Follow up : 12/07/2019 or if symptoms worsen or fail to improve.  Tifani Dack E Georgenia Salim 11/25/2019, 9:50 AM

## 2019-11-28 ENCOUNTER — Telehealth: Payer: Self-pay | Admitting: *Deleted

## 2019-11-28 NOTE — Telephone Encounter (Signed)
Attempted to contact to schedule lung screening scan. However there is no answer or voicemail option at # listed in EMR

## 2019-11-30 ENCOUNTER — Other Ambulatory Visit: Payer: Self-pay

## 2019-11-30 DIAGNOSIS — Z7689 Persons encountering health services in other specified circumstances: Secondary | ICD-10-CM

## 2019-12-01 LAB — CBC WITH DIFFERENTIAL/PLATELET
Basophils Absolute: 0 10*3/uL (ref 0.0–0.2)
Basos: 0 %
EOS (ABSOLUTE): 0 10*3/uL (ref 0.0–0.4)
Eos: 1 %
Hematocrit: 44.1 % (ref 37.5–51.0)
Hemoglobin: 14.9 g/dL (ref 13.0–17.7)
Immature Grans (Abs): 0 10*3/uL (ref 0.0–0.1)
Immature Granulocytes: 0 %
Lymphocytes Absolute: 2.2 10*3/uL (ref 0.7–3.1)
Lymphs: 49 %
MCH: 33 pg (ref 26.6–33.0)
MCHC: 33.8 g/dL (ref 31.5–35.7)
MCV: 98 fL — ABNORMAL HIGH (ref 79–97)
Monocytes Absolute: 0.7 10*3/uL (ref 0.1–0.9)
Monocytes: 16 %
Neutrophils Absolute: 1.5 10*3/uL (ref 1.4–7.0)
Neutrophils: 34 %
Platelets: 231 10*3/uL (ref 150–450)
RBC: 4.52 x10E6/uL (ref 4.14–5.80)
RDW: 13.1 % (ref 11.6–15.4)
WBC: 4.4 10*3/uL (ref 3.4–10.8)

## 2019-12-01 LAB — URINALYSIS
Bilirubin, UA: NEGATIVE
Glucose, UA: NEGATIVE
Leukocytes,UA: NEGATIVE
Nitrite, UA: NEGATIVE
RBC, UA: NEGATIVE
Specific Gravity, UA: >=1.03 — AB (ref 1.005–1.030)
Urobilinogen, Ur: 0.2 mg/dL (ref 0.2–1.0)
pH, UA: 5.5 (ref 5.0–7.5)

## 2019-12-01 LAB — COMPREHENSIVE METABOLIC PANEL
ALT: 77 IU/L — ABNORMAL HIGH (ref 0–44)
AST: 68 IU/L — ABNORMAL HIGH (ref 0–40)
Albumin/Globulin Ratio: 1.2 (ref 1.2–2.2)
Albumin: 4.2 g/dL (ref 3.8–4.9)
Alkaline Phosphatase: 108 IU/L (ref 48–121)
BUN/Creatinine Ratio: 11 (ref 9–20)
BUN: 11 mg/dL (ref 6–24)
Bilirubin Total: 0.5 mg/dL (ref 0.0–1.2)
CO2: 21 mmol/L (ref 20–29)
Calcium: 9.3 mg/dL (ref 8.7–10.2)
Chloride: 105 mmol/L (ref 96–106)
Creatinine, Ser: 1.04 mg/dL (ref 0.76–1.27)
GFR calc Af Amer: 93 mL/min/{1.73_m2} (ref >59)
GFR calc non Af Amer: 80 mL/min/{1.73_m2} (ref >59)
Globulin, Total: 3.6 g/dL (ref 1.5–4.5)
Glucose: 94 mg/dL (ref 65–99)
Potassium: 4.9 mmol/L (ref 3.5–5.2)
Sodium: 140 mmol/L (ref 134–144)
Total Protein: 7.8 g/dL (ref 6.0–8.5)

## 2019-12-01 LAB — HEMOGLOBIN A1C
Est. average glucose Bld gHb Est-mCnc: 111 mg/dL
Hgb A1c MFr Bld: 5.5 % (ref 4.8–5.6)

## 2019-12-01 LAB — LIPID PANEL
Chol/HDL Ratio: 2 ratio (ref 0.0–5.0)
Cholesterol, Total: 145 mg/dL (ref 100–199)
HDL: 74 mg/dL (ref >39)
LDL Chol Calc (NIH): 57 mg/dL (ref 0–99)
Triglycerides: 73 mg/dL (ref 0–149)
VLDL Cholesterol Cal: 14 mg/dL (ref 5–40)

## 2019-12-01 LAB — PSA: Prostate Specific Ag, Serum: 1.8 ng/mL (ref 0.0–4.0)

## 2019-12-01 LAB — TSH: TSH: 0.483 u[IU]/mL (ref 0.450–4.500)

## 2019-12-02 ENCOUNTER — Other Ambulatory Visit: Payer: Self-pay

## 2019-12-05 ENCOUNTER — Other Ambulatory Visit: Payer: Self-pay

## 2019-12-05 ENCOUNTER — Ambulatory Visit: Payer: Self-pay | Admitting: Pharmacy Technician

## 2019-12-05 DIAGNOSIS — Z79899 Other long term (current) drug therapy: Secondary | ICD-10-CM

## 2019-12-05 NOTE — Progress Notes (Signed)
Completed [facility] application and contract.  Patient agreed to all terms of the [facility] contract.    Patient approved to receive medication assistance at [facility] until time for re-certification in 2022, and as long as eligibility criteria continues to be met.    Provided patient with community resource material based on his particular needs.    Marrisa Kimber J. Eliyanah Elgersma Care Manager [facility]  

## 2019-12-06 ENCOUNTER — Telehealth: Payer: Self-pay

## 2019-12-06 ENCOUNTER — Other Ambulatory Visit: Payer: Self-pay

## 2019-12-08 ENCOUNTER — Ambulatory Visit: Payer: Self-pay | Admitting: Gerontology

## 2019-12-09 ENCOUNTER — Telehealth: Payer: Self-pay | Admitting: *Deleted

## 2019-12-09 NOTE — Telephone Encounter (Signed)
Received referral for lung screening. Patient reports smoking history of 1/2 pack per day x 45 years. Explained to patient that he is not eligible with < 30 pack year history under regular, insurance covered guidelines. We anticipate insurance will begin covering lung screening for patients with < 30 pack year history in the future. We will contact him for scheduling at that time. He is in agreement with this plan.

## 2019-12-21 ENCOUNTER — Ambulatory Visit: Payer: Self-pay | Admitting: Gerontology

## 2019-12-30 ENCOUNTER — Other Ambulatory Visit: Payer: Self-pay | Admitting: Family Medicine

## 2019-12-30 ENCOUNTER — Ambulatory Visit
Admission: RE | Admit: 2019-12-30 | Discharge: 2019-12-30 | Disposition: A | Discharge status: Home / Self Care | Payer: Disability Insurance | Source: Ambulatory Visit | Attending: Family Medicine | Admitting: Family Medicine

## 2019-12-30 ENCOUNTER — Ambulatory Visit
Admission: RE | Admit: 2019-12-30 | Discharge: 2019-12-30 | Disposition: A | Discharge status: Home / Self Care | Payer: Disability Insurance | Attending: Family Medicine | Admitting: Family Medicine

## 2019-12-30 DIAGNOSIS — M05741 Rheumatoid arthritis with rheumatoid factor of right hand without organ or systems involvement: Secondary | ICD-10-CM

## 2020-01-03 ENCOUNTER — Ambulatory Visit: Payer: Self-pay | Admitting: Gerontology

## 2020-01-03 ENCOUNTER — Encounter: Payer: Self-pay | Admitting: Gerontology

## 2020-01-03 ENCOUNTER — Other Ambulatory Visit: Payer: Self-pay

## 2020-01-03 VITALS — BP 129/85 | HR 74 | Ht 68.0 in | Wt 142.8 lb

## 2020-01-03 DIAGNOSIS — M25552 Pain in left hip: Secondary | ICD-10-CM

## 2020-01-03 DIAGNOSIS — R748 Abnormal levels of other serum enzymes: Secondary | ICD-10-CM

## 2020-01-03 NOTE — Progress Notes (Signed)
Established Patient Office Visit  Subjective:  Patient ID: Lucas Yu, male    DOB: Jul 20, 1965  Age: 54 y.o. MRN: [id number]  CC:  Chief Complaint  Patient presents with  . lab follow up    HPI TATE ZAGAL presents for lab review. He states that he's compliant with his medications and continues to make healthy lifestyle choices. His lab done on 11/30/2019, Urinalysis showed +1 protein, trace ketones and urine was turbid. His Liver enzymes, AST was 68 and ALT was 77. He states that he drinks 6-8 twelve ounces of beer every weekend, and denies any history of hepatitis or right upper quadrant abdominal pain. He also continues to experience constant sharp non radiating 7/10 pain to left hip, and will schedule appointment with Orthopedic when his Financial application is approved. He also states that taking 200 mg Gabapentin minimally relieve his peripheral neuropathy, had right hand Xray done on 12/30/2019 and it showed Soft tissue swelling around the second, third, and fourth proximal interphalangeal joints. No bony erosion or joint space narrowing per Dr D. Williams 111. He states that imaging was required by his disability Doctor. Overall, he states that he's doing well, and looking forward to the approval of his financial application so to schedule his Orthopedic appointment.   Past Medical History:  Diagnosis Date  . Hypertension     History reviewed. No pertinent surgical history.  History reviewed. No pertinent family history.  Social History   Socioeconomic History  . Marital status: Single    Spouse name: Not on file  . Number of children: Not on file  . Years of education: Not on file  . Highest education level: Not on file  Occupational History  . Not on file  Tobacco Use  . Smoking status: Current Every Day Smoker    Packs/day: 0.50    Types: Cigarettes  . Smokeless tobacco: Never Used  Vaping Use  . Vaping Use: Never used  Substance and Sexual Activity  .  Alcohol use: Yes    Alcohol/week: 6.0 - 8.0 standard drinks    Types: 6 - 8 Cans of beer per week    Comment: Occasionally  . Drug use: Yes    Types: Marijuana  . Sexual activity: Yes  Other Topics Concern  . Not on file  Social History Narrative  . Not on file   Social Determinants of Health   Financial Resource Strain:   . Difficulty of Paying Living Expenses:   Food Insecurity:   . Worried About Programme researcher, broadcasting/film/video in the Last Year:   . Barista in the Last Year:   Transportation Needs:   . Freight forwarder (Medical):   Marland Kitchen Lack of Transportation (Non-Medical):   Physical Activity:   . Days of Exercise per Week:   . Minutes of Exercise per Session:   Stress:   . Feeling of Stress :   Social Connections:   . Frequency of Communication with Friends and Family:   . Frequency of Social Gatherings with Friends and Family:   . Attends Religious Services:   . Active Member of Clubs or Organizations:   . Attends Banker Meetings:   Marland Kitchen Marital Status:   Intimate Partner Violence:   . Fear of Current or Ex-Partner:   . Emotionally Abused:   Marland Kitchen Physically Abused:   . Sexually Abused:     Outpatient Medications Prior to Visit  Medication Sig Dispense Refill  . cyclobenzaprine (FLEXERIL)  10 MG tablet Take by mouth.    . gabapentin (NEURONTIN) 100 MG capsule Take 2 capsules (200 mg total) by mouth at bedtime. 60 capsule 0  . HYDROcodone-acetaminophen (NORCO/VICODIN) 5-325 MG tablet Take by mouth.    Marland Kitchen ibuprofen (ADVIL) 800 MG tablet Take 1 tablet (800 mg total) by mouth in the morning and at bedtime. 60 tablet 0  . naproxen sodium (ALEVE) 220 MG tablet Take by mouth.    Marland Kitchen omeprazole (PRILOSEC) 20 MG capsule Take 1 capsule (20 mg total) by mouth daily. 30 capsule 0  . traMADol (ULTRAM) 50 MG tablet Take 1 tablet (50 mg total) by mouth every 6 (six) hours as needed. 20 tablet 0   No facility-administered medications prior to visit.    Allergies   Allergen Reactions  . Penicillins     Has patient had a PCN reaction causing immediate rash, facial/tongue/throat swelling, SOB or lightheadedness with hypotension: No Has patient had a PCN reaction causing severe rash involving mucus membranes or skin necrosis: No Has patient had a PCN reaction that required hospitalization: No Has patient had a PCN reaction occurring within the last 10 years: No If all of the above answers are "NO", then may proceed with Cephalosporin use.     ROS Review of Systems  Constitutional: Negative.   Respiratory: Negative.   Cardiovascular: Negative.   Musculoskeletal: Positive for arthralgias (chronic left hip pain).  Neurological: Positive for numbness (right hand).      Objective:    Physical Exam Cardiovascular:     Rate and Rhythm: Normal rate and regular rhythm.     Pulses: Normal pulses.     Heart sounds: Normal heart sounds.  Pulmonary:     Effort: Pulmonary effort is normal.     Breath sounds: Normal breath sounds.  Musculoskeletal:        General: Tenderness (to left hip with flexing) present.  Neurological:     General: No focal deficit present.     Mental Status: He is alert and oriented to person, place, and time. Mental status is at baseline.  Psychiatric:        Mood and Affect: Mood normal.        Behavior: Behavior normal.        Thought Content: Thought content normal.        Judgment: Judgment normal.     BP 129/85 (BP Location: Right Arm, Patient Position: Sitting)   Pulse 74   Ht 5\' 8"  (1.727 m)   Wt 142 lb 12.8 oz (64.8 kg)   SpO2 98%   BMI 21.71 kg/m  Wt Readings from Last 3 Encounters:  01/03/20 142 lb 12.8 oz (64.8 kg)  11/24/19 147 lb (66.7 kg)  10/10/19 170 lb (77.1 kg)     Health Maintenance Due  Topic Date Due  . Hepatitis C Screening  Never done  . COVID-19 Vaccine (1) Never done  . HIV Screening  Never done  . TETANUS/TDAP  Never done  . COLONOSCOPY  Never done   Colonoscopy referral after  Orthopedic appointment.   Lab Results  Component Value Date   TSH 0.483 11/30/2019   Lab Results  Component Value Date   WBC 4.4 11/30/2019   HGB 14.9 11/30/2019   HCT 44.1 11/30/2019   MCV 98 (H) 11/30/2019   PLT 231 11/30/2019   Lab Results  Component Value Date   NA 140 11/30/2019   K 4.9 11/30/2019   CO2 21 11/30/2019   GLUCOSE 94  11/30/2019   BUN 11 11/30/2019   CREATININE 1.04 11/30/2019   BILITOT 0.5 11/30/2019   ALKPHOS 108 11/30/2019   AST 68 (H) 11/30/2019   ALT 77 (H) 11/30/2019   PROT 7.8 11/30/2019   ALBUMIN 4.2 11/30/2019   CALCIUM 9.3 11/30/2019   ANIONGAP 9 02/11/2018   Lab Results  Component Value Date   CHOL 145 11/30/2019   Lab Results  Component Value Date   HDL 74 11/30/2019   Lab Results  Component Value Date   LDLCALC 57 11/30/2019   Lab Results  Component Value Date   TRIG 73 11/30/2019   Lab Results  Component Value Date   CHOLHDL 2.0 11/30/2019   Lab Results  Component Value Date   HGBA1C 5.5 11/30/2019      Assessment & Plan:   1. Left hip pain - His [facility] financial application has not been approved, will follow up with Financial Navigator. He was advised to continue on current treatment regimen.  2. Elevated liver enzymes - He was advised on alcohol abstinence and increase water intake. Will recheck Liver enzymes and hepatitis panel. - Hepatic function panel; Future - Urinalysis; Future - Hepatitis, Acute; Future     Follow-up: Return in about 5 weeks (around 02/08/2020), or if symptoms worsen or fail to improve.    Isay Perleberg Trellis Paganini, NP

## 2020-02-01 ENCOUNTER — Other Ambulatory Visit: Payer: Self-pay

## 2020-02-01 VITALS — BP 122/73 | HR 62 | Temp 98.0°F | Ht 68.0 in | Wt 143.5 lb

## 2020-02-01 DIAGNOSIS — R748 Abnormal levels of other serum enzymes: Secondary | ICD-10-CM

## 2020-02-01 DIAGNOSIS — R202 Paresthesia of skin: Secondary | ICD-10-CM

## 2020-02-01 NOTE — Addendum Note (Signed)
Addended by: Benjamin Stain on: 02/01/2020 02:32 PM   Modules accepted: Orders

## 2020-02-02 LAB — URINALYSIS
Bilirubin, UA: NEGATIVE
Glucose, UA: NEGATIVE
Ketones, UA: NEGATIVE
Leukocytes,UA: NEGATIVE
Nitrite, UA: NEGATIVE
Protein,UA: NEGATIVE
RBC, UA: NEGATIVE
Specific Gravity, UA: 1.022 (ref 1.005–1.030)
Urobilinogen, Ur: 1 mg/dL (ref 0.2–1.0)
pH, UA: 5.5 (ref 5.0–7.5)

## 2020-02-02 LAB — HEPATIC FUNCTION PANEL
ALT: 52 IU/L — ABNORMAL HIGH (ref 0–44)
AST: 50 IU/L — ABNORMAL HIGH (ref 0–40)
Albumin: 4.5 g/dL (ref 3.8–4.9)
Alkaline Phosphatase: 93 IU/L (ref 48–121)
Bilirubin Total: 0.6 mg/dL (ref 0.0–1.2)
Bilirubin, Direct: 0.16 mg/dL (ref 0.00–0.40)
Total Protein: 8.1 g/dL (ref 6.0–8.5)

## 2020-02-02 LAB — HEPATITIS PANEL, ACUTE
Hep A IgM: NEGATIVE
Hep B C IgM: NEGATIVE
Hep C Virus Ab: >11 s/co ratio — ABNORMAL HIGH (ref 0.0–0.9)
Hepatitis B Surface Ag: NEGATIVE

## 2020-02-06 ENCOUNTER — Ambulatory Visit: Payer: Disability Insurance | Attending: Internal Medicine

## 2020-02-06 DIAGNOSIS — Z23 Encounter for immunization: Secondary | ICD-10-CM

## 2020-02-06 NOTE — Progress Notes (Signed)
   Covid-19 Vaccination Clinic  Name:  STEEN BISIG    MRN: [id number] DOB: 12/01/1965  02/06/2020  Mr. Pfeifle was observed post Covid-19 immunization for 15 minutes without incident. He was provided with Vaccine Information Sheet and instruction to access the V-Safe system.   Mr. Wint was instructed to call 911 with any severe reactions post vaccine: Marland Kitchen Difficulty breathing  . Swelling of face and throat  . A fast heartbeat  . A bad rash all over body  . Dizziness and weakness   Immunizations Administered    Name Date Dose VIS Date Route   Pfizer COVID-19 Vaccine 02/06/2020  3:23 PM 0.3 mL 08/17/2018 Intramuscular   Manufacturer: [employer], [employer]   Lot: [id number]   NDC: [address]-1

## 2020-02-07 ENCOUNTER — Ambulatory Visit: Payer: Self-pay | Admitting: Gerontology

## 2020-02-07 ENCOUNTER — Other Ambulatory Visit: Payer: Self-pay

## 2020-02-07 VITALS — BP 125/81 | HR 71 | Temp 98.1°F | Wt 143.1 lb

## 2020-02-07 DIAGNOSIS — M25552 Pain in left hip: Secondary | ICD-10-CM

## 2020-02-07 DIAGNOSIS — R748 Abnormal levels of other serum enzymes: Secondary | ICD-10-CM

## 2020-02-07 DIAGNOSIS — R200 Anesthesia of skin: Secondary | ICD-10-CM

## 2020-02-07 MED ORDER — MELOXICAM 7.5 MG PO TABS: 7.5000 mg | ORAL_TABLET | Freq: Every day | ORAL | 0 refills | Status: DC

## 2020-02-07 MED ORDER — GABAPENTIN 100 MG PO CAPS: 200.0000 mg | ORAL_CAPSULE | Freq: Every day | ORAL | 0 refills | Status: DC

## 2020-02-07 NOTE — Progress Notes (Signed)
Established Patient Office Visit  Subjective:  Patient ID: Lucas Yu, male    DOB: August 15, 1965  Age: 54 y.o. MRN: [id number]  CC: No chief complaint on file.   HPI Lucas Yu presents for follow up of elevated liver enzymes , lab review and medication refill. He states that he's compliant with his medications and continues to make healthy lifestyle choices His AST done on 02/01/2020 decreased from 68 to 50 and ALT decreased from 77 to 52. He states that he cut down on his alcohol consumption to 6-12 twelve once beer on weekends. His Hepatitis C antibody was positive, he denies right upper abdominal pain and jaundice.He also continues to experience constant sharp non radiating 7/10 pain to left hip, and will schedule appointment with Orthopedic when his Financial application is approved.  Overall, he states that he's doing well and offers no further complaint.    Past Medical History:  Diagnosis Date  . Hypertension     No past surgical history on file.  No family history on file.  Social History   Socioeconomic History  . Marital status: Single    Spouse name: Not on file  . Number of children: Not on file  . Years of education: Not on file  . Highest education level: Not on file  Occupational History  . Not on file  Tobacco Use  . Smoking status: Current Every Day Smoker    Packs/day: 0.50    Types: Cigarettes  . Smokeless tobacco: Never Used  Vaping Use  . Vaping Use: Never used  Substance and Sexual Activity  . Alcohol use: Yes    Alcohol/week: 6.0 - 8.0 standard drinks    Types: 6 - 8 Cans of beer per week    Comment: Occasionally  . Drug use: Yes    Types: Marijuana  . Sexual activity: Yes  Other Topics Concern  . Not on file  Social History Narrative  . Not on file   Social Determinants of Health   Financial Resource Strain:   . Difficulty of Paying Living Expenses:   Food Insecurity:   . Worried About Programme researcher, broadcasting/film/video in the Last Year:   .  Barista in the Last Year:   Transportation Needs:   . Freight forwarder (Medical):   Marland Kitchen Lack of Transportation (Non-Medical):   Physical Activity:   . Days of Exercise per Week:   . Minutes of Exercise per Session:   Stress:   . Feeling of Stress :   Social Connections:   . Frequency of Communication with Friends and Family:   . Frequency of Social Gatherings with Friends and Family:   . Attends Religious Services:   . Active Member of Clubs or Organizations:   . Attends Banker Meetings:   Marland Kitchen Marital Status:   Intimate Partner Violence:   . Fear of Current or Ex-Partner:   . Emotionally Abused:   Marland Kitchen Physically Abused:   . Sexually Abused:     Outpatient Medications Prior to Visit  Medication Sig Dispense Refill  . cyclobenzaprine (FLEXERIL) 10 MG tablet Take by mouth.    . naproxen sodium (ALEVE) 220 MG tablet Take by mouth.    . gabapentin (NEURONTIN) 100 MG capsule Take 2 capsules (200 mg total) by mouth at bedtime. 60 capsule 0  . omeprazole (PRILOSEC) 20 MG capsule Take 1 capsule (20 mg total) by mouth daily. 30 capsule 0  . HYDROcodone-acetaminophen (NORCO/VICODIN) 5-325 MG tablet  Take by mouth.    . traMADol (ULTRAM) 50 MG tablet Take 1 tablet (50 mg total) by mouth every 6 (six) hours as needed. (Patient not taking: Reported on 02/07/2020) 20 tablet 0  . ibuprofen (ADVIL) 800 MG tablet Take 1 tablet (800 mg total) by mouth in the morning and at bedtime. (Patient not taking: Reported on 02/07/2020) 60 tablet 0   No facility-administered medications prior to visit.    Allergies  Allergen Reactions  . Penicillins     Has patient had a PCN reaction causing immediate rash, facial/tongue/throat swelling, SOB or lightheadedness with hypotension: No Has patient had a PCN reaction causing severe rash involving mucus membranes or skin necrosis: No Has patient had a PCN reaction that required hospitalization: No Has patient had a PCN reaction occurring  within the last 10 years: No If all of the above answers are "NO", then may proceed with Cephalosporin use.     ROS Review of Systems  Constitutional: Negative.   Respiratory: Negative.   Cardiovascular: Negative.   Gastrointestinal: Negative.   Musculoskeletal: Positive for arthralgias (left hip pain).  Skin: Negative.   Neurological: Negative.   Psychiatric/Behavioral: Negative.       Objective:    Physical Exam HENT:     Head: Normocephalic and atraumatic.  Cardiovascular:     Rate and Rhythm: Normal rate.     Pulses: Normal pulses.     Heart sounds: Normal heart sounds.  Pulmonary:     Effort: Pulmonary effort is normal.     Breath sounds: Normal breath sounds.  Abdominal:     General: Abdomen is flat. Bowel sounds are normal.     Palpations: Abdomen is soft.  Skin:    General: Skin is warm and dry.  Neurological:     General: No focal deficit present.     Mental Status: He is alert and oriented to person, place, and time. Mental status is at baseline.  Psychiatric:        Mood and Affect: Mood normal.        Behavior: Behavior normal.        Thought Content: Thought content normal.        Judgment: Judgment normal.     BP 125/81 (BP Location: Left Arm, Patient Position: Sitting, Cuff Size: Normal)   Pulse 71   Temp 98.1 F (36.7 C)   Wt 143 lb 1.6 oz (64.9 kg)   BMI 21.76 kg/m  Wt Readings from Last 3 Encounters:  02/07/20 143 lb 1.6 oz (64.9 kg)  02/01/20 143 lb 8 oz (65.1 kg)  01/03/20 142 lb 12.8 oz (64.8 kg)     Health Maintenance Due  Topic Date Due  . HIV Screening  Never done  . TETANUS/TDAP  Never done  . COLONOSCOPY  Never done  . INFLUENZA VACCINE  01/22/2020    There are no preventive care reminders to display for this patient.  Lab Results  Component Value Date   TSH 0.483 11/30/2019   Lab Results  Component Value Date   WBC 4.4 11/30/2019   HGB 14.9 11/30/2019   HCT 44.1 11/30/2019   MCV 98 (H) 11/30/2019   PLT 231  11/30/2019   Lab Results  Component Value Date   NA 140 11/30/2019   K 4.9 11/30/2019   CO2 21 11/30/2019   GLUCOSE 94 11/30/2019   BUN 11 11/30/2019   CREATININE 1.04 11/30/2019   BILITOT 0.6 02/01/2020   ALKPHOS 93 02/01/2020   AST  50 (H) 02/01/2020   ALT 52 (H) 02/01/2020   PROT 8.1 02/01/2020   ALBUMIN 4.5 02/01/2020   CALCIUM 9.3 11/30/2019   ANIONGAP 9 02/11/2018   Lab Results  Component Value Date   CHOL 145 11/30/2019   Lab Results  Component Value Date   HDL 74 11/30/2019   Lab Results  Component Value Date   LDLCALC 57 11/30/2019   Lab Results  Component Value Date   TRIG 73 11/30/2019   Lab Results  Component Value Date   CHOLHDL 2.0 11/30/2019   Lab Results  Component Value Date   HGBA1C 5.5 11/30/2019      Assessment & Plan:    1. Numbness and tingling of left leg - His Paresthesia is improving , will continues on current treatment regimen pending Orthopedic appointment. - gabapentin (NEURONTIN) 100 MG capsule; Take 2 capsules (200 mg total) by mouth at bedtime.  Dispense: 60 capsule; Refill: 0  2. Left hip pain - He will continue on medication, educated on side effects and was advised to notify clinic. - meloxicam (MOBIC) 7.5 MG tablet; Take 1 tablet (7.5 mg total) by mouth daily.  Dispense: 30 tablet; Refill: 0  3. Elevated liver enzymes - He was encouraged on alcohol abstinence and will follow up with - Ambulatory referral to Gastroenterology for positive Hep C.    Follow-up: Return in about 29 days (around 03/07/2020), or if symptoms worsen or fail to improve.    Pernella Ackerley Trellis Paganini, NP

## 2020-02-08 ENCOUNTER — Telehealth: Payer: Self-pay | Admitting: *Deleted

## 2020-02-08 NOTE — Telephone Encounter (Signed)
Given new contact info from PCP for arranging lung screening scan. Attempted to contact patient at both new and old phone numbers. There is no answer or voicemail option at either number. Will attempt at a later date.

## 2020-02-09 NOTE — Telephone Encounter (Signed)
No answer or voicemail option in attempt to contact patient.

## 2020-02-16 ENCOUNTER — Telehealth: Payer: Self-pay

## 2020-02-16 NOTE — Telephone Encounter (Signed)
Attempted to reach patient again for lung CT screening program after receiving referral from [facility]  Phone number listed is not in service.

## 2020-03-05 ENCOUNTER — Ambulatory Visit: Payer: Disability Insurance

## 2020-03-13 ENCOUNTER — Encounter: Payer: Self-pay | Admitting: Gerontology

## 2020-03-13 ENCOUNTER — Other Ambulatory Visit: Payer: Self-pay

## 2020-03-13 ENCOUNTER — Telehealth: Payer: Self-pay | Admitting: *Deleted

## 2020-03-13 ENCOUNTER — Ambulatory Visit: Payer: Self-pay | Admitting: Gerontology

## 2020-03-13 VITALS — BP 126/87 | HR 64 | Resp 17 | Ht 67.0 in | Wt 148.2 lb

## 2020-03-13 DIAGNOSIS — F17200 Nicotine dependence, unspecified, uncomplicated: Secondary | ICD-10-CM | POA: Insufficient documentation

## 2020-03-13 DIAGNOSIS — Z122 Encounter for screening for malignant neoplasm of respiratory organs: Secondary | ICD-10-CM

## 2020-03-13 DIAGNOSIS — Z87891 Personal history of nicotine dependence: Secondary | ICD-10-CM | POA: Insufficient documentation

## 2020-03-13 DIAGNOSIS — M25552 Pain in left hip: Secondary | ICD-10-CM

## 2020-03-13 DIAGNOSIS — R748 Abnormal levels of other serum enzymes: Secondary | ICD-10-CM

## 2020-03-13 NOTE — Telephone Encounter (Signed)
Received referral for initial lung cancer screening scan. Contacted patient and obtained smoking history,(current, 44 pack year) as well as answering questions related to screening process. Patient denies signs of lung cancer such as weight loss or hemoptysis. Patient denies comorbidity that would prevent curative treatment if lung cancer were found. Patient is scheduled for shared decision making visit and CT scan on 04/04/20 at 1030am.

## 2020-03-13 NOTE — Patient Instructions (Signed)

## 2020-03-13 NOTE — Progress Notes (Signed)
Established Patient Office Visit  Subjective:  Patient ID: Lucas Yu, male    DOB: February 25, 1966  Age: 54 y.o. MRN: [id number]  CC:  Chief Complaint  Patient presents with   Elevated Hepatic Enzymes    pt. also complains of bilateral hip pain    HPI [name] presents for follow up of elevated liver enzymes , lab review and medication refill. He states that he's compliant with his medications and continues to make healthy lifestyle changes. He states that he smokes 5-6 cigarette daily and admits the desire to quit.  His Hepatitis C antibody was positive, he denies right upper abdominal pain and jaundice and will reschedule his Gastroenterology appointment with Dr. Sharlet Salina. He also continues to experience constant sharp non radiating 7/10 pain to left hip, and will schedule appointment with Orthopedic since his Financial application is active. He reports that he is yet to pick up Meloxicam from Pharmacy. Overall, he states that he's doing well and offers no further complaint.  Past Medical History:  Diagnosis Date   Hypertension     History reviewed. No pertinent surgical history.  History reviewed. No pertinent family history.  Social History   Socioeconomic History   Marital status: Single    Spouse name: Not on file   Number of children: Not on file   Years of education: Not on file   Highest education level: Not on file  Occupational History   Not on file  Tobacco Use   Smoking status: Current Every Day Smoker    Packs/day: 0.25    Types: Cigarettes   Smokeless tobacco: Never Used   Tobacco comment: 4-5 cigarettes per day   Vaping Use   Vaping Use: Never used  Substance and Sexual Activity   Alcohol use: Yes    Alcohol/week: 4.0 - 5.0 standard drinks    Types: 4 - 5 Cans of beer per week    Comment: "on a weekend"    Drug use: Yes    Types: Marijuana   Sexual activity: Yes  Other Topics Concern   Not on file  Social History Narrative   Not  on file   Social Determinants of Health   Financial Resource Strain:    Difficulty of Paying Living Expenses: Not on file  Food Insecurity:    Worried About Running Out of Food in the Last Year: Not on file   Ran Out of Food in the Last Year: Not on file  Transportation Needs:    Lack of Transportation (Medical): Not on file   Lack of Transportation (Non-Medical): Not on file  Physical Activity:    Days of Exercise per Week: Not on file   Minutes of Exercise per Session: Not on file  Stress:    Feeling of Stress : Not on file  Social Connections:    Frequency of Communication with Friends and Family: Not on file   Frequency of Social Gatherings with Friends and Family: Not on file   Attends Religious Services: Not on file   Active Member of Clubs or Organizations: Not on file   Attends Banker Meetings: Not on file   Marital Status: Not on file  Intimate Partner Violence:    Fear of Current or Ex-Partner: Not on file   Emotionally Abused: Not on file   Physically Abused: Not on file   Sexually Abused: Not on file    Outpatient Medications Prior to Visit  Medication Sig Dispense Refill   cyclobenzaprine (FLEXERIL)  10 MG tablet Take by mouth.     gabapentin (NEURONTIN) 100 MG capsule Take 2 capsules (200 mg total) by mouth at bedtime. 60 capsule 0   meloxicam (MOBIC) 7.5 MG tablet Take 1 tablet (7.5 mg total) by mouth daily. 30 tablet 0   traMADol (ULTRAM) 50 MG tablet Take 1 tablet (50 mg total) by mouth every 6 (six) hours as needed. 20 tablet 0   HYDROcodone-acetaminophen (NORCO/VICODIN) 5-325 MG tablet Take by mouth.      naproxen sodium (ALEVE) 220 MG tablet Take by mouth as needed ("hurts my stomach" ; "only take when I have nothing else for pain").      No facility-administered medications prior to visit.    Allergies  Allergen Reactions   Penicillins     Has patient had a PCN reaction causing immediate rash,  facial/tongue/throat swelling, SOB or lightheadedness with hypotension: No Has patient had a PCN reaction causing severe rash involving mucus membranes or skin necrosis: No Has patient had a PCN reaction that required hospitalization: No Has patient had a PCN reaction occurring within the last 10 years: No If all of the above answers are "NO", then may proceed with Cephalosporin use.     ROS Review of Systems  Constitutional: Negative.   Respiratory: Negative.   Cardiovascular: Negative.   Musculoskeletal: Positive for arthralgias (left hip pain).  Neurological: Negative.   Psychiatric/Behavioral: Negative.       Objective:    Physical Exam HENT:     Head: Normocephalic and atraumatic.  Cardiovascular:     Rate and Rhythm: Normal rate and regular rhythm.     Pulses: Normal pulses.     Heart sounds: Normal heart sounds.  Pulmonary:     Effort: Pulmonary effort is normal.     Breath sounds: Normal breath sounds.  Musculoskeletal:        General: No tenderness.     Comments: Ambulates with a cane   Skin:    General: Skin is warm.  Neurological:     General: No focal deficit present.     Mental Status: He is alert and oriented to person, place, and time. Mental status is at baseline.  Psychiatric:        Mood and Affect: Mood normal.        Behavior: Behavior normal.        Thought Content: Thought content normal.        Judgment: Judgment normal.     BP 126/87 (BP Location: Right Arm, Patient Position: Sitting)    Pulse 64    Resp 17    Ht 5\' 7"  (1.702 m)    Wt 148 lb 3.2 oz (67.2 kg)    SpO2 98%    BMI 23.21 kg/m  Wt Readings from Last 3 Encounters:  03/13/20 148 lb 3.2 oz (67.2 kg)  02/07/20 143 lb 1.6 oz (64.9 kg)  02/01/20 143 lb 8 oz (65.1 kg)     Health Maintenance Due  Topic Date Due   HIV Screening  Never done   TETANUS/TDAP  Never done   COLONOSCOPY  Never done   INFLUENZA VACCINE  Never done   COVID-19 Vaccine (2 - Pfizer 2-dose series)  02/27/2020    There are no preventive care reminders to display for this patient.  Lab Results  Component Value Date   TSH 0.483 11/30/2019   Lab Results  Component Value Date   WBC 4.4 11/30/2019   HGB 14.9 11/30/2019   HCT 44.1  11/30/2019   MCV 98 (H) 11/30/2019   PLT 231 11/30/2019   Lab Results  Component Value Date   NA 140 11/30/2019   K 4.9 11/30/2019   CO2 21 11/30/2019   GLUCOSE 94 11/30/2019   BUN 11 11/30/2019   CREATININE 1.04 11/30/2019   BILITOT 0.6 02/01/2020   ALKPHOS 93 02/01/2020   AST 50 (H) 02/01/2020   ALT 52 (H) 02/01/2020   PROT 8.1 02/01/2020   ALBUMIN 4.5 02/01/2020   CALCIUM 9.3 11/30/2019   ANIONGAP 9 02/11/2018   Lab Results  Component Value Date   CHOL 145 11/30/2019   Lab Results  Component Value Date   HDL 74 11/30/2019   Lab Results  Component Value Date   LDLCALC 57 11/30/2019   Lab Results  Component Value Date   TRIG 73 11/30/2019   Lab Results  Component Value Date   CHOLHDL 2.0 11/30/2019   Lab Results  Component Value Date   HGBA1C 5.5 11/30/2019      Assessment & Plan:     1. Elevated liver enzymes - He was advised to reschedule appointment with Gastroenterology Dr Sharlet Salina for Hep C treatment.  2. Left hip pain - He was advised to pick up Meloxicam from Pharmacy, and schedule appointment with Cyndia Skeeters since his Financial application was approved.  3. Smoking - He was provided with [place] Quitline information and advised on smoking cessation.   Follow-up: Return in about 9 weeks (around 05/15/2020), or if symptoms worsen or fail to improve.    Flor Whitacre Trellis Paganini, NP

## 2020-03-14 ENCOUNTER — Other Ambulatory Visit: Payer: Self-pay

## 2020-03-14 DIAGNOSIS — M25552 Pain in left hip: Secondary | ICD-10-CM

## 2020-03-14 MED ORDER — MELOXICAM 7.5 MG PO TABS: 7.5000 mg | ORAL_TABLET | Freq: Every day | ORAL | 0 refills | Status: DC

## 2020-04-04 ENCOUNTER — Encounter: Payer: Self-pay | Admitting: *Deleted

## 2020-04-04 ENCOUNTER — Inpatient Hospital Stay: Payer: Disability Insurance | Attending: Hospice and Palliative Medicine | Admitting: Hospice and Palliative Medicine

## 2020-04-04 ENCOUNTER — Ambulatory Visit
Admission: RE | Admit: 2020-04-04 | Discharge: 2020-04-04 | Disposition: A | Discharge status: Home / Self Care | Payer: Self-pay | Source: Ambulatory Visit | Attending: Oncology | Admitting: Oncology

## 2020-04-04 ENCOUNTER — Other Ambulatory Visit: Payer: Self-pay

## 2020-04-04 DIAGNOSIS — Z87891 Personal history of nicotine dependence: Secondary | ICD-10-CM

## 2020-04-04 DIAGNOSIS — Z122 Encounter for screening for malignant neoplasm of respiratory organs: Secondary | ICD-10-CM | POA: Insufficient documentation

## 2020-04-04 NOTE — Progress Notes (Signed)
Virtual Visit via Video Note  I connected with@ on 04/04/20 at@ by a video enabled telemedicine application and verified that I am speaking with the correct person using two identifiers.   I discussed the limitations of evaluation and management by telemedicine and the availability of in person appointments. The patient expressed understanding and agreed to proceed.  In accordance with CMS guidelines, patient has met eligibility criteria including age, absence of signs or symptoms of lung cancer.  Social History   Tobacco Use  . Smoking status: Current Every Day Smoker    Packs/day: 1.00    Years: 44.00    Pack years: 44.00    Types: Cigarettes  . Smokeless tobacco: Never Used  . Tobacco comment: 4-5 cigarettes per day currently  Vaping Use  . Vaping Use: Never used  Substance Use Topics  . Alcohol use: Yes    Alcohol/week: 4.0 - 5.0 standard drinks    Types: 4 - 5 Cans of beer per week    Comment: "on a weekend"   . Drug use: Yes    Types: Marijuana      A shared decision-making session was conducted prior to the performance of CT scan. This includes one or more decision aids, includes benefits and harms of screening, follow-up diagnostic testing, over-diagnosis, false positive rate, and total radiation exposure.   Counseling on the importance of adherence to annual lung cancer LDCT screening, impact of co-morbidities, and ability or willingness to undergo diagnosis and treatment is imperative for compliance of the program.   Counseling on the importance of continued smoking cessation for former smokers; the importance of smoking cessation for current smokers, and information about tobacco cessation interventions have been given to patient including [facility] and [facility] quit [place] programs.   Written order for lung cancer screening with LDCT has been given to the patient and any and all questions have been answered to the best of my abilities.    Yearly follow up will be  coordinated by Burgess Estelle, Thoracic Navigator.  Time Total: 15 minutes  Visit consisted of counseling and education dealing with complex health screening. Greater than 50%  of this time was spent counseling and coordinating care related to the above assessment and plan.  Signed by: Altha Harm, PhD, NP-C

## 2020-04-04 NOTE — Progress Notes (Signed)
Rn called pt to remind him of my chart visit with Josh Borders at 1000 am today. Pt verbalized understanding. Pt rates pain in hips a 10, states is out of hydrocodone and has not picked up meloxicam yet.

## 2020-04-08 ENCOUNTER — Encounter: Payer: Self-pay | Admitting: *Deleted

## 2020-05-09 ENCOUNTER — Encounter: Payer: Self-pay | Admitting: Gastroenterology

## 2020-05-09 ENCOUNTER — Ambulatory Visit: Payer: Disability Insurance | Admitting: Gastroenterology

## 2020-05-15 ENCOUNTER — Other Ambulatory Visit: Payer: Self-pay

## 2020-05-15 ENCOUNTER — Ambulatory Visit: Payer: Medicaid Other | Admitting: Gerontology

## 2020-05-15 VITALS — BP 121/78 | HR 80 | Resp 16 | Wt 148.1 lb

## 2020-05-15 DIAGNOSIS — R200 Anesthesia of skin: Secondary | ICD-10-CM

## 2020-05-15 DIAGNOSIS — M25552 Pain in left hip: Secondary | ICD-10-CM

## 2020-05-15 MED ORDER — MELOXICAM 15 MG PO TABS: 15.0000 mg | ORAL_TABLET | Freq: Every day | ORAL | 0 refills | Status: DC

## 2020-05-15 MED ORDER — GABAPENTIN 400 MG PO CAPS: 400.0000 mg | ORAL_CAPSULE | Freq: Every day | ORAL | 0 refills | Status: DC

## 2020-05-15 NOTE — Progress Notes (Signed)
Established Patient Office Visit  Subjective:  Patient ID: Lucas Yu, male    DOB: [birth year]  Age: 54 y.o. MRN: [id number]  CC: No chief complaint on file.   HPI Lucas Yu presents for follow up of worsening left hip pain. Currently, he states that he experiences constant sharp 10/10 non radiating pain to his left hip. He states that walking and changing position from a sitting to standing aggravates symptom. He reports that taking Meloxicam minimally relieve his symptoms. 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 He states that he has an appointment with the Orthopedic Surgeon on 05/22/2020. He had Low Dose Lung CT scan done on 04/04/2020 and it showed benign appearance or behavior. Continue annual screening with low-dose chest CT without contrast in 12 months. 2.  Emphysema. He states that he has active Medicaid and this will be his last visit. Overall, he states that he's doing well and offers no further complaint.  Past Medical History:  Diagnosis Date   Hypertension     No past surgical history on file.  No family history on file.  Social History   Socioeconomic History   Marital status: Single    Spouse name: Not on file   Number of children: Not on file   Years of education: Not on file   Highest education level: Not on file  Occupational History   Not on file  Tobacco Use   Smoking status: Current Every Day Smoker    Packs/day: 1.00    Years: 44.00    Pack years: 44.00    Types: Cigarettes   Smokeless tobacco: Never Used   Tobacco comment: 4-5 cigarettes per day currently  Vaping Use   Vaping Use: Never used  Substance and Sexual Activity   Alcohol use: Yes    Alcohol/week: 4.0 - 5.0 standard drinks    Types: 4 - 5 Cans of beer per week    Comment: "on a weekend"    Drug use: Yes    Types: Marijuana   Sexual activity: Yes  Other Topics Concern   Not on file   Social History Narrative   Not on file   Social Determinants of Health   Financial Resource Strain:    Difficulty of Paying Living Expenses: Not on file  Food Insecurity:    Worried About Running Out of Food in the Last Year: Not on file   Ran Out of Food in the Last Year: Not on file  Transportation Needs:    Lack of Transportation (Medical): Not on file   Lack of Transportation (Non-Medical): Not on file  Physical Activity:    Days of Exercise per Week: Not on file   Minutes of Exercise per Session: Not on file  Stress:    Feeling of Stress : Not on file  Social Connections:    Frequency of Communication with Friends and Family: Not on file   Frequency of Social Gatherings with Friends and Family: Not on file   Attends Religious Services: Not on file   Active Member of Clubs or Organizations: Not on file   Attends Banker Meetings: Not on file   Marital Status: Not on file  Intimate Partner Violence:    Fear of Current or Ex-Partner: Not on file   Emotionally Abused: Not on file   Physically Abused: Not on file   Sexually Abused: Not on file    Outpatient Medications Prior  to Visit  Medication Sig Dispense Refill   cyclobenzaprine (FLEXERIL) 10 MG tablet Take by mouth.     gabapentin (NEURONTIN) 100 MG capsule Take 2 capsules (200 mg total) by mouth at bedtime. 60 capsule 0   HYDROcodone-acetaminophen (NORCO/VICODIN) 5-325 MG tablet Take by mouth.  (Patient not taking: Reported on 04/04/2020)     meloxicam (MOBIC) 7.5 MG tablet Take 1 tablet (7.5 mg total) by mouth daily. (Patient not taking: Reported on 04/04/2020) 30 tablet 0   naproxen sodium (ALEVE) 220 MG tablet Take by mouth as needed ("hurts my stomach" ; "only take when I have nothing else for pain").  (Patient not taking: Reported on 05/15/2020)     No facility-administered medications prior to visit.    Allergies  Allergen Reactions   Penicillins     Has patient had a  PCN reaction causing immediate rash, facial/tongue/throat swelling, SOB or lightheadedness with hypotension: No Has patient had a PCN reaction causing severe rash involving mucus membranes or skin necrosis: No Has patient had a PCN reaction that required hospitalization: No Has patient had a PCN reaction occurring within the last 10 years: No If all of the above answers are "NO", then may proceed with Cephalosporin use.     ROS Review of Systems  Constitutional: Negative.   Respiratory: Negative.   Cardiovascular: Negative.   Musculoskeletal: Positive for arthralgias (left hip).  Neurological: Positive for numbness (left leg).  Psychiatric/Behavioral: Negative.       Objective:    Physical Exam Constitutional:      Appearance: Normal appearance.  HENT:     Head: Normocephalic and atraumatic.  Cardiovascular:     Rate and Rhythm: Normal rate and regular rhythm.     Pulses: Normal pulses.     Heart sounds: Normal heart sounds.  Pulmonary:     Effort: Pulmonary effort is normal.     Breath sounds: Normal breath sounds.  Musculoskeletal:        General: Tenderness (to left hip with palpation) present.  Skin:    General: Skin is warm.  Neurological:     General: No focal deficit present.     Mental Status: He is alert and oriented to person, place, and time. Mental status is at baseline.     Gait: Gait abnormal (ambulates with a cane, limping gait.).  Psychiatric:        Mood and Affect: Mood normal.        Behavior: Behavior normal.        Thought Content: Thought content normal.        Judgment: Judgment normal.     BP 121/78 (BP Location: Left Arm, Patient Position: Sitting, Cuff Size: Large)    Pulse 80    Resp 16    Wt 148 lb 1.6 oz (67.2 kg)    SpO2 98%    BMI 23.20 kg/m  Wt Readings from Last 3 Encounters:  05/15/20 148 lb 1.6 oz (67.2 kg)  04/04/20 148 lb (67.1 kg)  03/13/20 148 lb 3.2 oz (67.2 kg)     Health Maintenance Due  Topic Date Due   HIV  Screening  Never done   TETANUS/TDAP  Never done   COLONOSCOPY  Never done   INFLUENZA VACCINE  Never done   COVID-19 Vaccine (2 - Pfizer 2-dose series) 02/27/2020    There are no preventive care reminders to display for this patient.  Lab Results  Component Value Date   TSH 0.483 11/30/2019   Lab Results  Component Value Date   WBC 4.4 11/30/2019   HGB 14.9 11/30/2019   HCT 44.1 11/30/2019   MCV 98 (H) 11/30/2019   PLT 231 11/30/2019   Lab Results  Component Value Date   NA 140 11/30/2019   K 4.9 11/30/2019   CO2 21 11/30/2019   GLUCOSE 94 11/30/2019   BUN 11 11/30/2019   CREATININE 1.04 11/30/2019   BILITOT 0.6 02/01/2020   ALKPHOS 93 02/01/2020   AST 50 (H) 02/01/2020   ALT 52 (H) 02/01/2020   PROT 8.1 02/01/2020   ALBUMIN 4.5 02/01/2020   CALCIUM 9.3 11/30/2019   ANIONGAP 9 02/11/2018   Lab Results  Component Value Date   CHOL 145 11/30/2019   Lab Results  Component Value Date   HDL 74 11/30/2019   Lab Results  Component Value Date   LDLCALC 57 11/30/2019   Lab Results  Component Value Date   TRIG 73 11/30/2019   Lab Results  Component Value Date   CHOLHDL 2.0 11/30/2019   Lab Results  Component Value Date   HGBA1C 5.5 11/30/2019      Assessment & Plan:     1. Numbness and tingling of left leg - His peripheral neuropathy is not controlled and his gabapentin will be increased to 400 mg at bedtime. - gabapentin (NEURONTIN) 400 MG capsule; Take 1 capsule (400 mg total) by mouth at bedtime.  Dispense: 30 capsule; Refill: 0  2. Left hip pain - He will continue on Meloxicam and was advised to follow up with Orthopedic Surgeon on 05/22/2020 and to go to the ED with worsening symptoms. - meloxicam (MOBIC) 15 MG tablet; Take 1 tablet (15 mg total) by mouth daily.  Dispense: 30 tablet; Refill: 0   Follow-up:  Lucas Yu has no follow up appointment because he has Medicaid. [facility] wishes him well with his care.He was advised to find and schedule  an appointment with another Provider.   Dorthey Depace Trellis Paganini, NP

## 2020-05-22 ENCOUNTER — Ambulatory Visit (INDEPENDENT_AMBULATORY_CARE_PROVIDER_SITE_OTHER): Payer: Medicaid Other | Admitting: Orthopaedic Surgery

## 2020-05-22 ENCOUNTER — Ambulatory Visit: Payer: Self-pay

## 2020-05-22 ENCOUNTER — Encounter: Payer: Self-pay | Admitting: Orthopaedic Surgery

## 2020-05-22 VITALS — Ht 67.0 in | Wt 148.0 lb

## 2020-05-22 DIAGNOSIS — M25552 Pain in left hip: Secondary | ICD-10-CM | POA: Diagnosis not present

## 2020-05-22 DIAGNOSIS — G8929 Other chronic pain: Secondary | ICD-10-CM

## 2020-05-22 DIAGNOSIS — M25551 Pain in right hip: Secondary | ICD-10-CM | POA: Diagnosis not present

## 2020-05-22 MED ORDER — TRAMADOL HCL 50 MG PO TABS
50.0000 mg | ORAL_TABLET | Freq: Every day | ORAL | 0 refills | Status: DC | PRN
Start: 2020-05-22 — End: 2020-07-20

## 2020-05-22 NOTE — Progress Notes (Signed)
Office Visit Note   Patient: Lucas Yu           Date of Birth: 1965-10-22           MRN: [id number] Visit Date: 05/22/2020              Requested by: Rolm Gala, NP [address] [place],  [place] [address] PCP: Rolm Gala, NP   Assessment & Plan: Visit Diagnoses:  1. Chronic hip pain, bilateral     Plan: Overall the pain seems to be out of proportion with x-ray and clinical findings.  He has reported severe pain despite conservative treatment since April.  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  Follow-up after the MRI.  Tramadol prescribed to use sparingly.  Follow-Up Instructions: Return if symptoms worsen or fail to improve.   Orders:  No orders of the defined types were placed in this encounter.  Meds ordered this encounter  Medications  . traMADol (ULTRAM) 50 MG tablet    Sig: Take 1-2 tablets (50-100 mg total) by mouth daily as needed.    Dispense:  20 tablet    Refill:  0      Procedures: No procedures performed   Clinical Data: No additional findings.   Subjective: Chief Complaint  Patient presents with  . Left Hip - Pain  . Right Hip - Pain    Lucas Yu is a 54 year old gentleman comes in for evaluation of chronic bilateral hip pain worse on the left.  He states that he has radiation of pain with numbness and tingling down his left leg and gets weak and gives way.  Denies any bowel or bladder dysfunction.  Denies history of sickle cell disease or trait.  He is an active smoker and he drinks about 12-24 beers every weekend.  Currently not working.  [facility]  He was also evaluated once in the ER.  X-rays of his pelvis showed findings concerning for avascular  necrosis.  He does endorse groin pain as well.   Review of Systems  Constitutional: Negative.   All other systems reviewed and are negative.    Objective: Vital Signs: Ht 5\' 7"  (1.702 m)   Wt 148 lb (67.1 kg)   BMI 23.18 kg/m   Physical Exam Vitals and nursing note reviewed.  Constitutional:      Appearance: He is well-developed.  HENT:     Head: Normocephalic and atraumatic.  Eyes:     Pupils: Pupils are equal, round, and reactive to light.  Pulmonary:     Effort: Pulmonary effort is normal.  Abdominal:     Palpations: Abdomen is soft.  Musculoskeletal:        General: Normal range of motion.     Cervical back: Neck supple.  Skin:    General: Skin is warm.  Neurological:     Mental Status: He is alert and oriented to person, place, and time.  Psychiatric:        Behavior: Behavior normal.        Thought Content: Thought content normal.        Judgment: Judgment normal.     Ortho Exam Patient with antalgic gait.  He is currently sitting in a wheelchair.  He is tender throughout his hip and lumbar spine.  He is demonstrating a lot of guarding to hip range of motion. Specialty Comments:  No specialty comments available.  Imaging: No results found.   PMFS History: Patient Active Problem List   Diagnosis Date Noted  . Smoking 03/13/2020  . Elevated liver enzymes 01/03/2020  . Encounter to establish care 11/24/2019  . Left hip pain 11/24/2019  . Numbness and tingling of left leg 11/24/2019   Past Medical History:  Diagnosis Date  . Hypertension     History reviewed. No pertinent family history.  History reviewed. No pertinent surgical history. Social History   Occupational History  . Not on file  Tobacco Use  . Smoking status: Current Every Day Smoker    Packs/day: 1.00    Years: 44.00    Pack years: 44.00    Types: Cigarettes  . Smokeless tobacco: Never Used  . Tobacco comment: 4-5 cigarettes per day currently  Vaping Use  . Vaping Use: Never  used  Substance and Sexual Activity  . Alcohol use: Yes    Alcohol/week: 4.0 - 5.0 standard drinks    Types: 4 - 5 Cans of beer per week    Comment: "on a weekend"   . Drug use: Yes    Types: Marijuana  . Sexual activity: Yes

## 2020-05-23 ENCOUNTER — Other Ambulatory Visit: Payer: Self-pay

## 2020-05-23 DIAGNOSIS — G8929 Other chronic pain: Secondary | ICD-10-CM

## 2020-05-23 DIAGNOSIS — M545 Low back pain, unspecified: Secondary | ICD-10-CM

## 2020-06-21 ENCOUNTER — Ambulatory Visit
Admission: RE | Admit: 2020-06-21 | Discharge: 2020-06-21 | Disposition: A | Discharge status: Home / Self Care | Payer: Medicaid Other | Source: Ambulatory Visit | Attending: Orthopaedic Surgery | Admitting: Orthopaedic Surgery

## 2020-06-21 ENCOUNTER — Other Ambulatory Visit: Payer: Self-pay

## 2020-06-21 DIAGNOSIS — M545 Low back pain, unspecified: Secondary | ICD-10-CM

## 2020-06-21 DIAGNOSIS — M25552 Pain in left hip: Secondary | ICD-10-CM

## 2020-06-26 ENCOUNTER — Telehealth: Payer: Self-pay

## 2020-06-26 ENCOUNTER — Telehealth: Payer: Self-pay | Admitting: Orthopaedic Surgery

## 2020-06-26 ENCOUNTER — Ambulatory Visit: Payer: Medicaid Other | Admitting: Orthopaedic Surgery

## 2020-06-26 MED ORDER — PREDNISONE 10 MG (21) PO TBPK: ORAL_TABLET | ORAL | 0 refills | Status: DC

## 2020-06-26 NOTE — Telephone Encounter (Signed)
Yes i'll call him

## 2020-06-26 NOTE — Telephone Encounter (Signed)
Please advise 

## 2020-06-26 NOTE — Telephone Encounter (Signed)
noted 

## 2020-06-26 NOTE — Telephone Encounter (Signed)
Spoke to patient . Sent in prednisone.

## 2020-06-26 NOTE — Telephone Encounter (Signed)
Patient called he would like to go over MRI results over the phone if possible. CB:[phone]

## 2020-06-26 NOTE — Telephone Encounter (Signed)
Pt called asking if his MRI results could be given over the phone since he can't find anyone to bring him to his appt? If so pt would like to be reached at the following number:  [phone]

## 2020-06-26 NOTE — Telephone Encounter (Signed)
Ok thank you 

## 2020-07-20 ENCOUNTER — Other Ambulatory Visit: Payer: Self-pay

## 2020-07-20 ENCOUNTER — Emergency Department
Admission: EM | Admit: 2020-07-20 | Discharge: 2020-07-20 | Disposition: A | Discharge status: Home / Self Care | Payer: Medicaid Other | Attending: Emergency Medicine | Admitting: Emergency Medicine

## 2020-07-20 ENCOUNTER — Emergency Department: Payer: Medicaid Other

## 2020-07-20 ENCOUNTER — Encounter: Payer: Self-pay | Admitting: Emergency Medicine

## 2020-07-20 DIAGNOSIS — Y9389 Activity, other specified: Secondary | ICD-10-CM | POA: Insufficient documentation

## 2020-07-20 DIAGNOSIS — S63501A Unspecified sprain of right wrist, initial encounter: Secondary | ICD-10-CM | POA: Insufficient documentation

## 2020-07-20 DIAGNOSIS — F17210 Nicotine dependence, cigarettes, uncomplicated: Secondary | ICD-10-CM | POA: Insufficient documentation

## 2020-07-20 DIAGNOSIS — I10 Essential (primary) hypertension: Secondary | ICD-10-CM | POA: Insufficient documentation

## 2020-07-20 DIAGNOSIS — W01198A Fall on same level from slipping, tripping and stumbling with subsequent striking against other object, initial encounter: Secondary | ICD-10-CM | POA: Insufficient documentation

## 2020-07-20 DIAGNOSIS — S6991XA Unspecified injury of right wrist, hand and finger(s), initial encounter: Secondary | ICD-10-CM | POA: Diagnosis present

## 2020-07-20 MED ORDER — OXYCODONE-ACETAMINOPHEN 5-325 MG PO TABS
1.0000 | ORAL_TABLET | ORAL | Status: DC | PRN
  Administered 2020-07-20: 1 via ORAL
  Filled 2020-07-20: qty 1

## 2020-07-20 MED ORDER — IBUPROFEN 600 MG PO TABS
600.0000 mg | ORAL_TABLET | Freq: Three times a day (TID) | ORAL | 0 refills | Status: DC | PRN
Start: 2020-07-20 — End: 2024-05-03

## 2020-07-20 MED ORDER — TRAMADOL HCL 50 MG PO TABS: 50.0000 mg | ORAL_TABLET | Freq: Four times a day (QID) | ORAL | 0 refills | Status: AC | PRN

## 2020-07-20 NOTE — ED Triage Notes (Signed)
Pt comes into the ED via POV c/o right wrist pain after falling trying to catch his dog.

## 2020-07-20 NOTE — ED Provider Notes (Signed)
[facility] Emergency Department Provider Note   ____________________________________________   Event Date/Time   First MD Initiated Contact with Patient 07/20/20 [phone]     (approximate)  I have reviewed the triage vital signs and the nursing notes.   HISTORY  Chief Complaint Wrist Pain    HPI Lucas Yu is a 55 y.o. male patient complaint of right wrist pain secondary to a fall.  Patient state he was trying to catch his dog when he slipped and fell landing on his right wrist.  Patient denies loss of sensation.  Patient decreased range of motion at the distal wrist with extension or flexion.  Patient is right-hand dominant.  Patient rates pain as a 10/10.  Patient described pain as "achy".  No palliative measure prior to arrival.         Past Medical History:  Diagnosis Date  . Hypertension     Patient Active Problem List   Diagnosis Date Noted  . Smoking 03/13/2020  . Elevated liver enzymes 01/03/2020  . Encounter to establish care 11/24/2019  . Left hip pain 11/24/2019  . Numbness and tingling of left leg 11/24/2019    History reviewed. No pertinent surgical history.  Prior to Admission medications   Medication Sig Start Date End Date Taking? Authorizing Provider  ibuprofen (ADVIL) 600 MG tablet Take 1 tablet (600 mg total) by mouth every 8 (eight) hours as needed. 07/20/20  Yes Joni Reining, PA-C  traMADol (ULTRAM) 50 MG tablet Take 1 tablet (50 mg total) by mouth every 6 (six) hours as needed. 07/20/20 07/20/21 Yes Joni Reining, PA-C  gabapentin (NEURONTIN) 400 MG capsule Take 1 capsule (400 mg total) by mouth at bedtime. 05/15/20   Iloabachie, Chioma E, NP    Allergies Penicillins  History reviewed. No pertinent family history.  Social History Social History   Tobacco Use  . Smoking status: Current Every Day Smoker    Packs/day: 1.00    Years: 44.00    Pack years: 44.00    Types: Cigarettes  . Smokeless tobacco: Never  Used  . Tobacco comment: 4-5 cigarettes per day currently  Vaping Use  . Vaping Use: Never used  Substance Use Topics  . Alcohol use: Yes    Alcohol/week: 4.0 - 5.0 standard drinks    Types: 4 - 5 Cans of beer per week    Comment: "on a weekend"   . Drug use: Yes    Types: Marijuana    Review of Systems Constitutional: No fever/chills Eyes: No visual changes. ENT: No sore throat. Cardiovascular: Denies chest pain. Respiratory: Denies shortness of breath. Gastrointestinal: No abdominal pain.  No nausea, no vomiting.  No diarrhea.  No constipation. Genitourinary: Negative for dysuria. Musculoskeletal: Right wrist pain. Skin: Negative for rash. Neurological: Negative for headaches, focal weakness or numbness. Endocrine:  Hypertension Hematological/Lymphatic:  Allergic/Immunilogical: Penicillin  ____________________________________________   PHYSICAL EXAM:  VITAL SIGNS: ED Triage Vitals [07/20/20 0750]  Enc Vitals Group     BP      Pulse      Resp      Temp      Temp src      SpO2      Weight 170 lb (77.1 kg)     Height 5\' 8"  (1.727 m)     Head Circumference      Peak Flow      Pain Score 10     Pain Loc      Pain Edu?  Excl. in GC?    Constitutional: Alert and oriented. Well appearing and in no acute distress. Cardiovascular: Normal rate, regular rhythm. Grossly normal heart sounds.  Good peripheral circulation. Respiratory: Normal respiratory effort.  No retractions. Lungs CTAB. Musculoskeletal: No obvious deformity to the right wrist.  Patient is moderate guarding palpation distal radius.  Patient has decreased range of motion with by complaint of pain. Neurologic:  Normal speech and language. No gross focal neurologic deficits are appreciated. No gait instability. Skin:  Skin is warm, dry and intact. No rash noted. Psychiatric: Mood and affect are normal. Speech and behavior are normal.  ____________________________________________   LABS (all labs  ordered are listed, but only abnormal results are displayed)  Labs Reviewed - No data to display ____________________________________________  EKG   ____________________________________________  RADIOLOGY I, Joni Reining, personally viewed and evaluated these images (plain radiographs) as part of my medical decision making, as well as reviewing the written report by the radiologist.  ED MD interpretation: No acute findings x-ray of the right wrist.  Official radiology report(s): DG Wrist Complete Right  Result Date: 07/20/2020 CLINICAL DATA:  Fall, wrist pain EXAM: RIGHT WRIST - COMPLETE 3+ VIEW COMPARISON:  Right hand radiographs, 12/30/2019 FINDINGS: No acute fracture or dislocation of the right wrist. There is a cortical irregularity at the thenar aspect of the distal scaphoid pole, which may reflect sequelae of prior trauma, but this appearance is unchanged compared to prior examination dated 12/30/2019. The carpus is normally aligned and joint spaces are preserved. Soft tissues are unremarkable. IMPRESSION: 1. No acute fracture or dislocation of the right wrist. 2. Cortical irregularity at the thenar aspect of the distal scaphoid pole, which may reflect sequelae of prior trauma, but this appearance is unchanged compared to prior examination dated 12/30/2019. Electronically Signed   By: Lauralyn Primes M.D.   On: 07/20/2020 08:13    ____________________________________________   PROCEDURES  Procedure(s) performed (including Critical Care):  Procedures   ____________________________________________   INITIAL IMPRESSION / ASSESSMENT AND PLAN / ED COURSE  As part of my medical decision making, I reviewed the following data within the electronic MEDICAL RECORD NUMBER         Patient complain of right wrist pain secondary to a fall.  Discussed no acute findings on x-ray of the right wrist.  Patient placed in a wrist splint and given discharge care instruction.  Advised take  medication as directed.      ____________________________________________   FINAL CLINICAL IMPRESSION(S) / ED DIAGNOSES  Final diagnoses:  Sprain of right wrist, initial encounter     ED Discharge Orders         Ordered    traMADol (ULTRAM) 50 MG tablet  Every 6 hours PRN        07/20/20 0837    ibuprofen (ADVIL) 600 MG tablet  Every 8 hours PRN        07/20/20 4196          *Please note:  Lucas NAVARRETTE was evaluated in Emergency Department on 07/20/2020 for the symptoms described in the history of present illness. He was evaluated in the context of the global COVID-19 pandemic, which necessitated consideration that the patient might be at risk for infection with the SARS-CoV-2 virus that causes COVID-19. Institutional protocols and algorithms that pertain to the evaluation of patients at risk for COVID-19 are in a state of rapid change based on information released by regulatory bodies including the CDC and federal and state organizations.  These policies and algorithms were followed during the patient's care in the ED.  Some ED evaluations and interventions may be delayed as a result of limited staffing during and the pandemic.*   Note:  This document was prepared using Dragon voice recognition software and may include unintentional dictation errors.    Joni Reining, PA-C 07/20/20 6834    Shaune Pollack, MD 07/22/20 2040

## 2020-07-20 NOTE — Discharge Instructions (Addendum)
No fracture or dislocation noticed on x-ray of your right wrist today.  Follow discharge care instructions and wear wrist splint for 3 to 5 days as needed.  Be advised withdraws effects of pain medication.

## 2020-11-09 ENCOUNTER — Telehealth: Payer: Self-pay | Admitting: Pharmacy Technician

## 2020-11-09 NOTE — Telephone Encounter (Signed)
Patient has [employer] with pharmacy coverage.  No longer meets [facility]'s eligibility criteria.  Sherilyn Dacosta Care Manager [facility]   [address] [place], [place]  [address]  Nov 09, 2020    Melburn Treiber [address] [place], [place]  [address]  Dear Simona Huh:  This is to inform you that you are no longer eligible to receive medication assistance at [facility].  The reason(s) are:    _____Your total gross monthly household income exceeds 250% of the Federal Poverty Level.   _____Tangible assets (savings, checking, stocks/bonds, pension, retirement, etc.) exceeds our limit  _____You are eligible to receive benefits from [facility], [facility] or HIV Medication            Assistance program _____You are eligible to receive benefits from a Medicare Part "D" plan __X__You have prescription insurance with [employer] _____You are not an [employer] resident _____Failure to provide all requested proof of income information for 2022.    We regret that we are unable to help you at this time.  If your prescription coverage is terminated, please contact [facility], so that we may reassess your eligibility for our program.  If you have questions, we may be contacted at [phone].  Thank you,  [facility]

## 2020-11-12 ENCOUNTER — Other Ambulatory Visit: Payer: Self-pay

## 2021-05-02 ENCOUNTER — Other Ambulatory Visit: Payer: Self-pay | Admitting: Physician Assistant

## 2021-05-02 ENCOUNTER — Other Ambulatory Visit (HOSPITAL_BASED_OUTPATIENT_CLINIC_OR_DEPARTMENT_OTHER): Payer: Self-pay | Admitting: Physician Assistant

## 2021-05-02 DIAGNOSIS — B182 Chronic viral hepatitis C: Secondary | ICD-10-CM

## 2021-05-02 DIAGNOSIS — F1020 Alcohol dependence, uncomplicated: Secondary | ICD-10-CM

## 2021-05-08 ENCOUNTER — Other Ambulatory Visit (HOSPITAL_COMMUNITY): Payer: Self-pay | Admitting: Physician Assistant

## 2021-05-08 ENCOUNTER — Other Ambulatory Visit: Payer: Self-pay | Admitting: Physician Assistant

## 2021-05-08 DIAGNOSIS — M4696 Unspecified inflammatory spondylopathy, lumbar region: Secondary | ICD-10-CM

## 2021-05-09 ENCOUNTER — Ambulatory Visit: Payer: Medicaid Other

## 2021-05-14 ENCOUNTER — Ambulatory Visit: Payer: Medicaid Other

## 2021-05-15 ENCOUNTER — Ambulatory Visit
Admission: RE | Admit: 2021-05-15 | Discharge: 2021-05-15 | Disposition: A | Discharge status: Home / Self Care | Payer: Medicaid Other | Source: Ambulatory Visit | Attending: Physician Assistant | Admitting: Physician Assistant

## 2021-05-15 DIAGNOSIS — F1020 Alcohol dependence, uncomplicated: Secondary | ICD-10-CM | POA: Insufficient documentation

## 2021-05-15 DIAGNOSIS — B182 Chronic viral hepatitis C: Secondary | ICD-10-CM | POA: Insufficient documentation

## 2021-05-22 ENCOUNTER — Ambulatory Visit: Payer: Medicaid Other

## 2021-05-28 ENCOUNTER — Ambulatory Visit: Payer: Medicaid Other

## 2021-05-28 ENCOUNTER — Other Ambulatory Visit: Payer: Medicaid Other

## 2021-06-11 ENCOUNTER — Other Ambulatory Visit: Payer: Medicaid Other

## 2021-06-18 ENCOUNTER — Other Ambulatory Visit: Payer: Self-pay | Admitting: Family Medicine

## 2021-06-18 DIAGNOSIS — N50812 Left testicular pain: Secondary | ICD-10-CM

## 2021-06-19 ENCOUNTER — Ambulatory Visit
Admission: RE | Admit: 2021-06-19 | Discharge: 2021-06-19 | Disposition: A | Discharge status: Home / Self Care | Payer: Medicaid Other | Source: Ambulatory Visit | Attending: Physician Assistant | Admitting: Physician Assistant

## 2021-06-19 ENCOUNTER — Other Ambulatory Visit: Payer: Self-pay

## 2021-06-19 ENCOUNTER — Ambulatory Visit
Admission: RE | Admit: 2021-06-19 | Discharge: 2021-06-19 | Disposition: A | Discharge status: Home / Self Care | Payer: Medicaid Other | Source: Ambulatory Visit | Attending: Family Medicine | Admitting: Family Medicine

## 2021-06-19 DIAGNOSIS — M4696 Unspecified inflammatory spondylopathy, lumbar region: Secondary | ICD-10-CM | POA: Insufficient documentation

## 2021-06-19 DIAGNOSIS — N50812 Left testicular pain: Secondary | ICD-10-CM

## 2021-09-10 ENCOUNTER — Telehealth: Payer: Self-pay | Admitting: Acute Care

## 2021-09-10 NOTE — Telephone Encounter (Signed)
Attempted to reach pt to schedule annual LDCT. Call went to VM which was not set up-unable to leave message ? ?

## 2022-09-29 ENCOUNTER — Encounter: Payer: Self-pay | Admitting: *Deleted

## 2022-12-31 IMAGING — MR MR PELVIS W/O CM
4 of 5 series · 27 of 48 positions shown · non-contrast
Comparison: 06/21/2020

CLINICAL DATA: Low back pain, bilateral hip pain. Ongoing for 3-4
years.

EXAM:
MRI PELVIS WITHOUT CONTRAST
TECHNIQUE: Multiplanar multisequence MR imaging of the pelvis was performed. No
intravenous contrast was administered.

[Series 8: STIR · coronal · 4.0mm · 1.12mm/px · 7 of 40 slices shown]
[im 1/40]
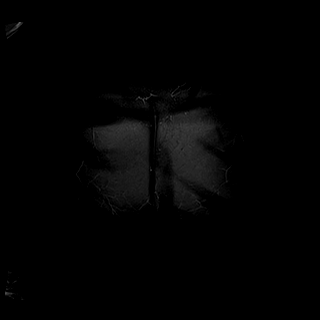
[im 7/40]
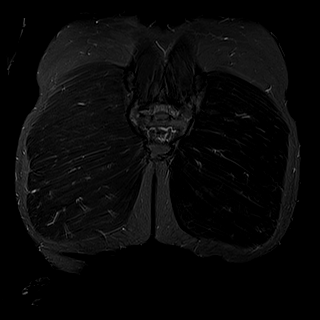
[im 14/40]
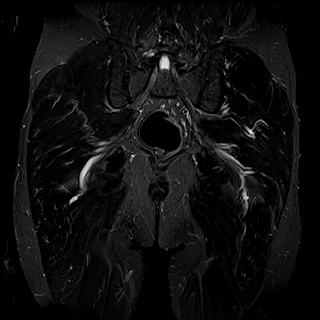
[im 20/40]
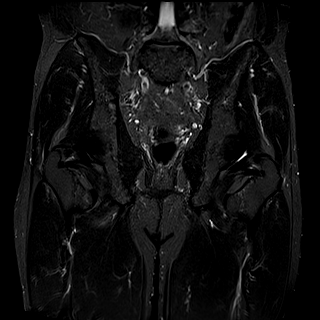
[im 27/40]
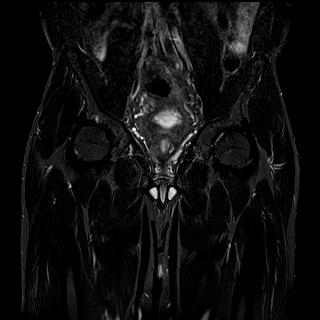
[im 33/40]
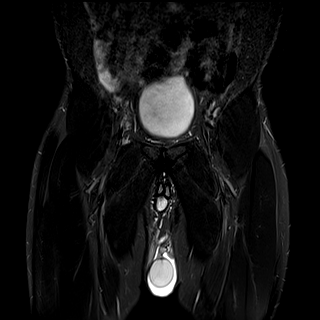
[im 40/40]
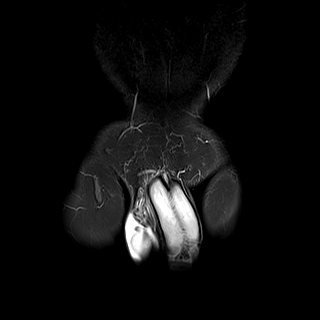

[Series 9: T1 · coronal · 4.0mm · 1.12mm/px · 7 of 40 slices shown (1 of 2)]
[im 1/40]
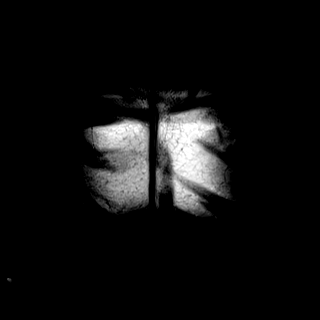
[im 7/40]
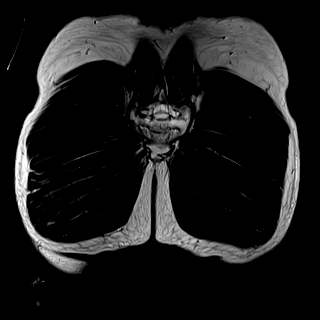
[im 14/40]
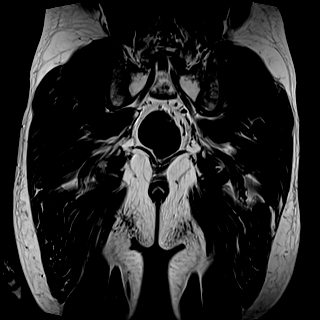
[im 20/40]
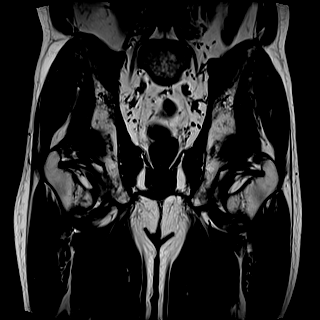
[im 27/40]
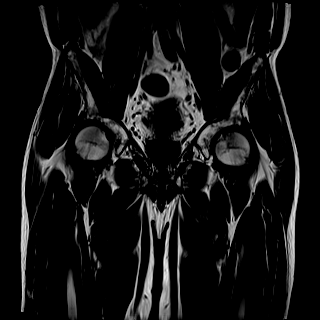
[im 33/40]
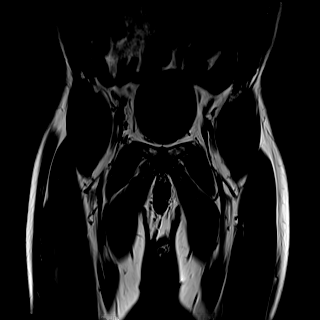
[im 40/40]
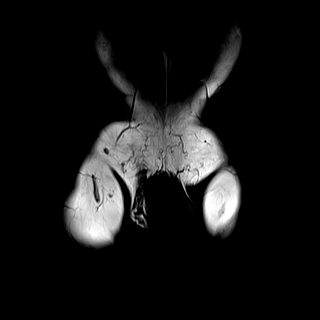

[Series 10: T1 · axial · 4.0mm · 0.74mm/px · z∈[-1,+254]mm · 8 of 52 slices shown (2 of 2)]
[im 1/52]
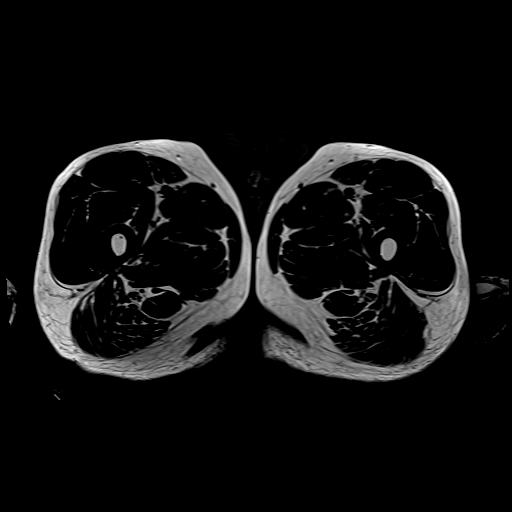
[im 6/52]
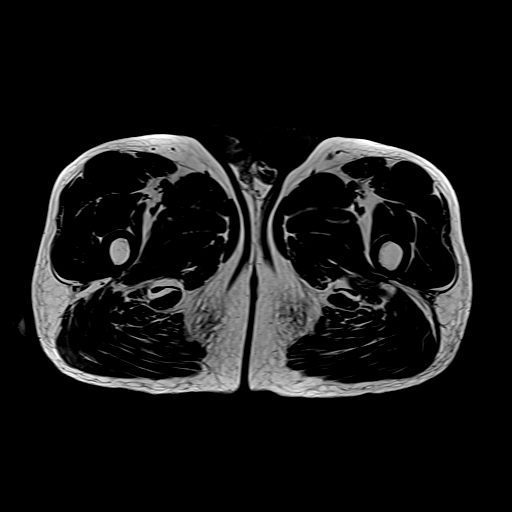
[im 18/52]
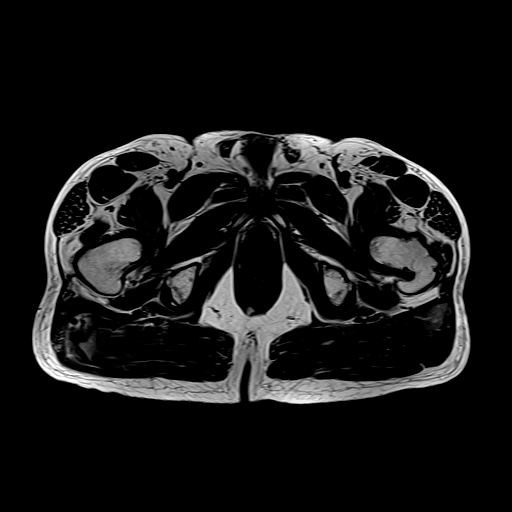
[im 23/52]
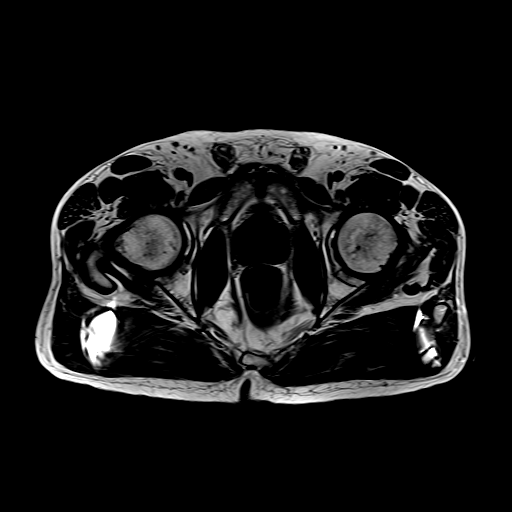
[im 29/52]
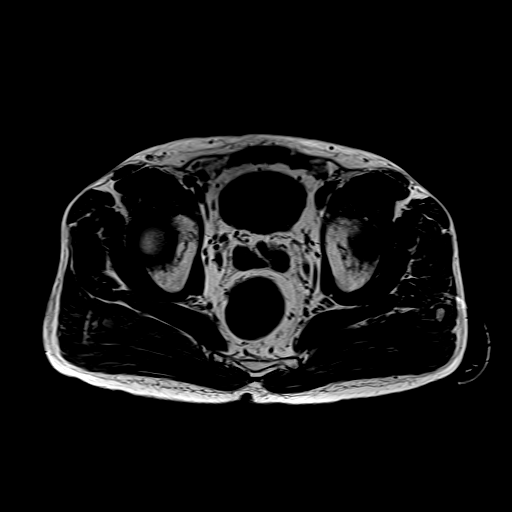
[im 35/52]
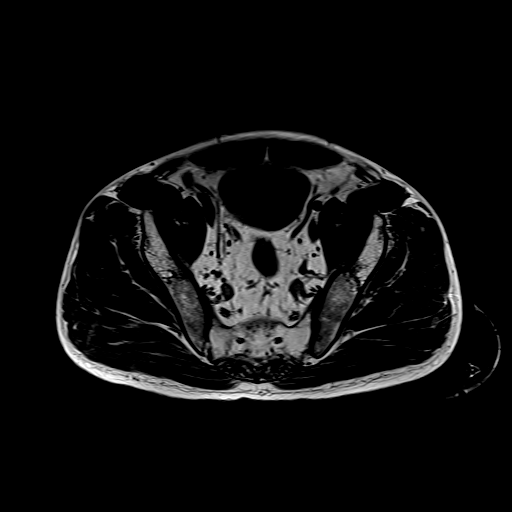
[im 46/52]
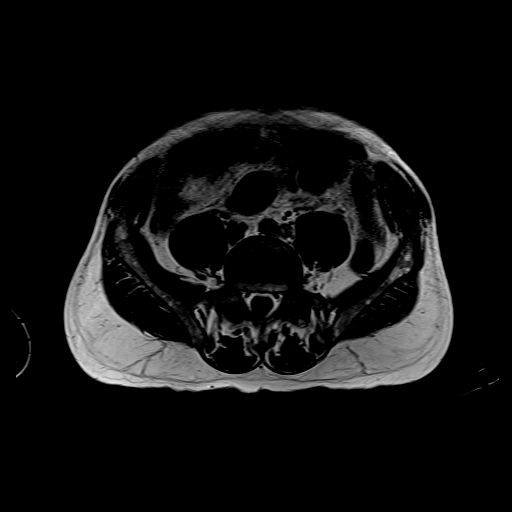
[im 52/52]
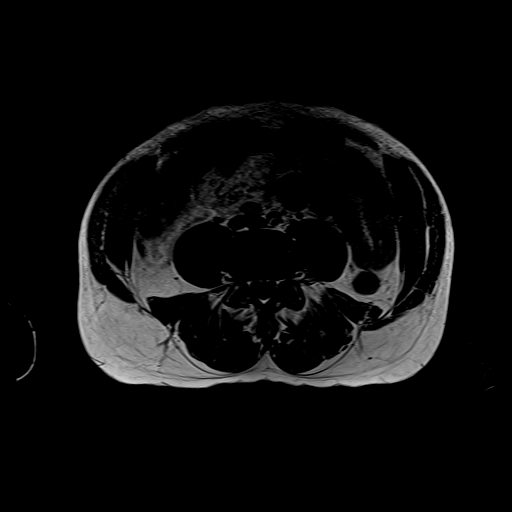

[Series 11: T2 fat-sat · axial · 4.0mm · 0.74mm/px · z∈[-1,+224]mm · 5 of 52 slices shown]
[im 1/52]
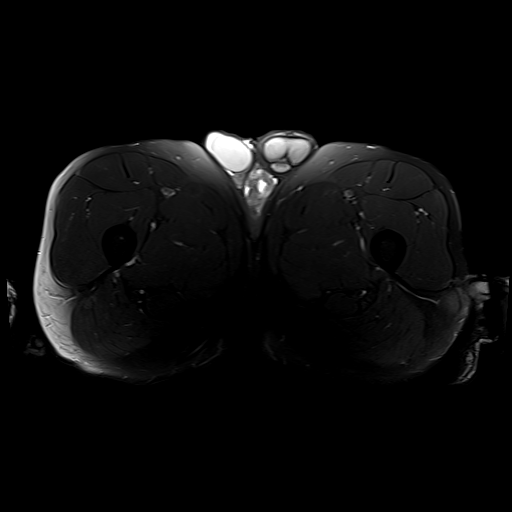
[im 6/52]
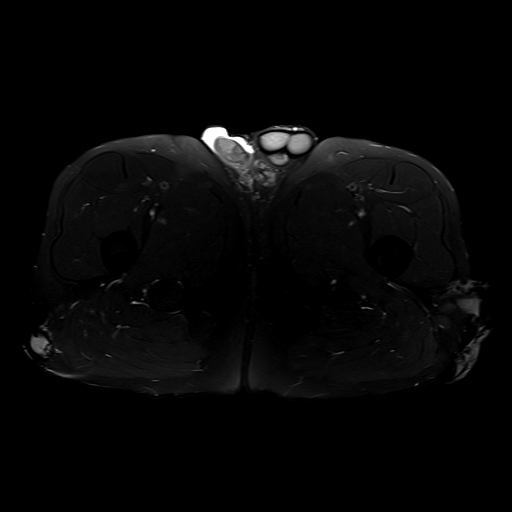
[im 18/52]
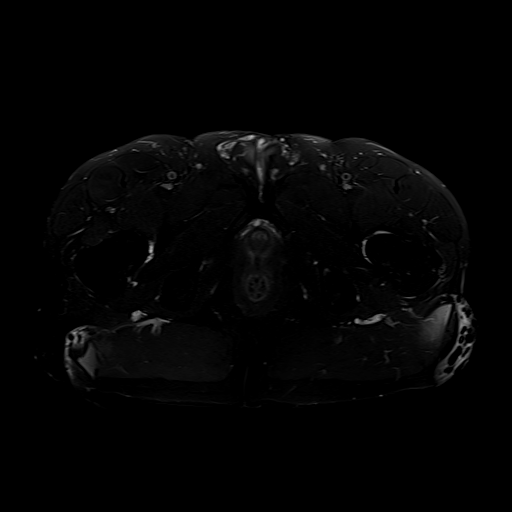
[im 29/52]
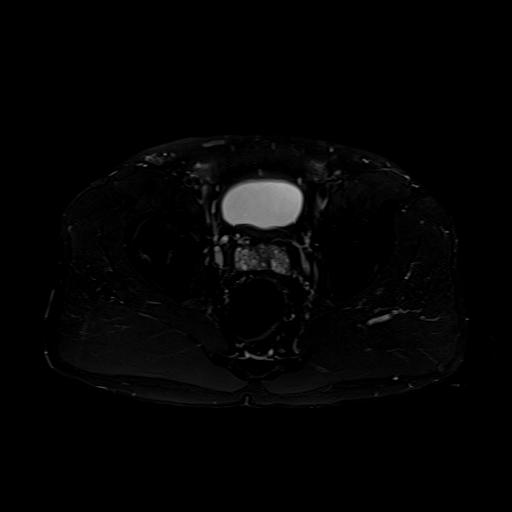
[im 46/52]
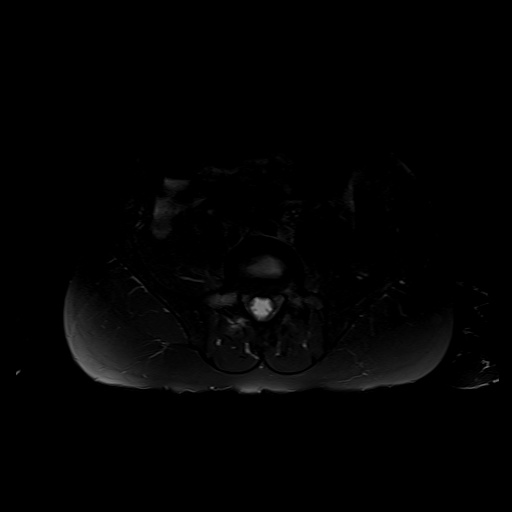

[27 of 48 positions shown; findings below may reference images not displayed]

FINDINGS: Bones:

No acute fracture or dislocation. Persistent stable area of
subchondral serpiginous signal abnormality in the superior posterior
right femoral head with mild surrounding bone marrow edema most
consistent with a small area of avascular necrosis without articular
surface collapse. Similar subtle area of subchondral signal
abnormality in the superior left femoral head likely reflecting an
area of avascular necrosis without articular surface collapse.

No periosteal reaction or bone destruction. No aggressive osseous
lesion.

Normal sacrum and sacroiliac joints. No SI joint widening or erosive
changes.

Moderate bilateral facet arthropathy at L4-5 with persistent
reactive bone marrow edema in the adjacent facets and pedicles most
severe at L5.

Articular cartilage and labrum

Articular cartilage: Mild partial-thickness cartilage loss of
bilateral hips.

Labrum: Grossly intact, but evaluation is limited by lack of
intraarticular fluid.

Joint or bursal effusion

Joint effusion:  No hip joint effusion.  No SI joint effusion.

Bursae:  No bursa formation.

Muscles and tendons

Flexors: Normal.

Extensors: Normal.

Abductors: Normal.

Adductors: Normal.

Gluteals: Normal.

Hamstrings: Normal.

Other findings

No pelvic free fluid. No fluid collection or hematoma. No inguinal
lymphadenopathy. No inguinal hernia.
IMPRESSION: 1. No acute injury of the pelvis.
2. Stable small areas of avascular necrosis involving bilateral
femoral heads without interval change compared with 06/21/2020.
3. Moderate bilateral facet arthropathy at L4-5 with persistent
reactive bone marrow edema in the adjacent facets and pedicles most
severe at L5.

## 2022-12-31 IMAGING — MR MR LUMBAR SPINE W/O CM
5 series · 31 of 48 positions shown · non-contrast
Comparison: 06/21/2020

CLINICAL DATA: Low back pain radiating into both hips and legs

EXAM:
MRI LUMBAR SPINE WITHOUT CONTRAST
TECHNIQUE: Multiplanar, multisequence MR imaging of the lumbar spine was
performed. No intravenous contrast was administered.

[Series 5: T2 · sagittal · 4.0mm · 0.88mm/px · 6 of 17 slices shown (1 of 2)]
[im 1/17]
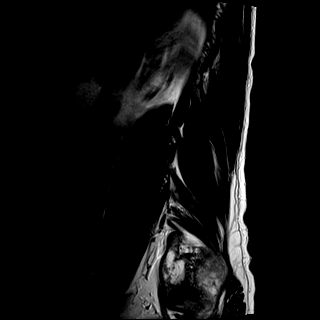
[im 4/17]
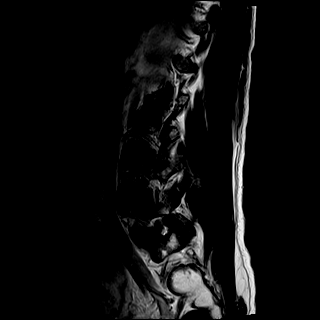
[im 7/17]
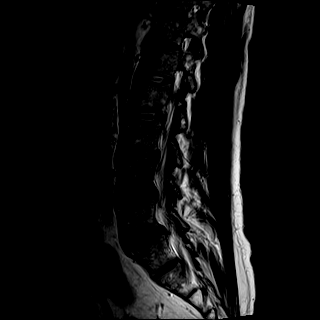
[im 10/17]
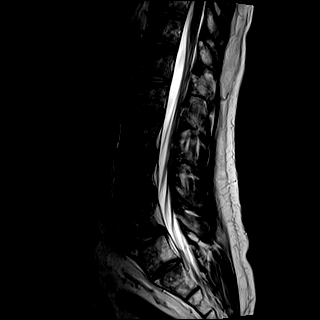
[im 13/17]
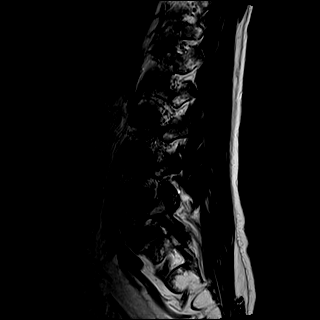
[im 17/17]
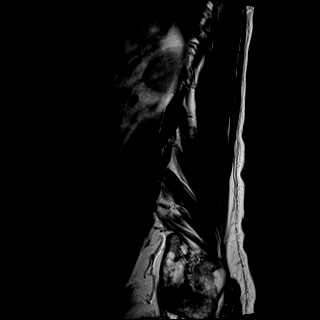

[Series 6: T1 · sagittal · 4.0mm · 0.88mm/px · 6 of 17 slices shown (1 of 2)]
[im 1/17]
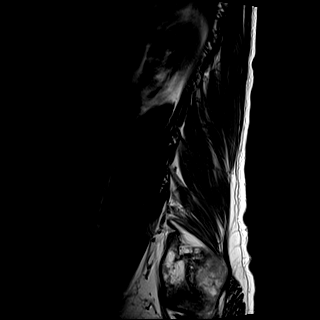
[im 4/17]
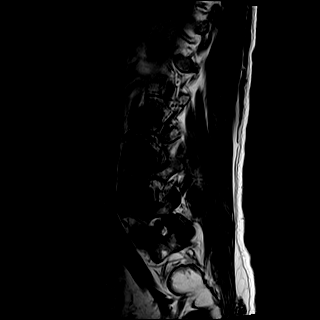
[im 7/17]
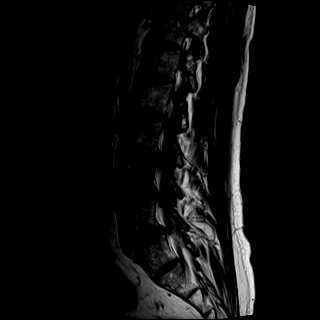
[im 10/17]
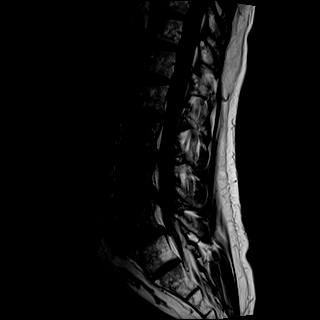
[im 13/17]
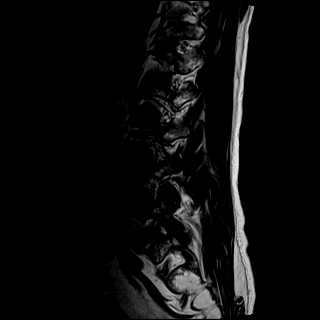
[im 17/17]
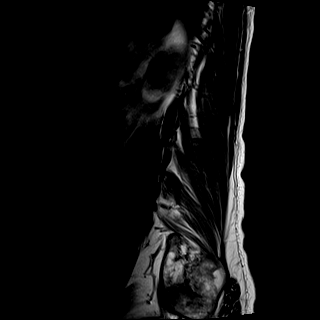

[Series 7: STIR · sagittal · 4.0mm · 0.44mm/px · 1 of 17 slices shown]
[im 1/17]
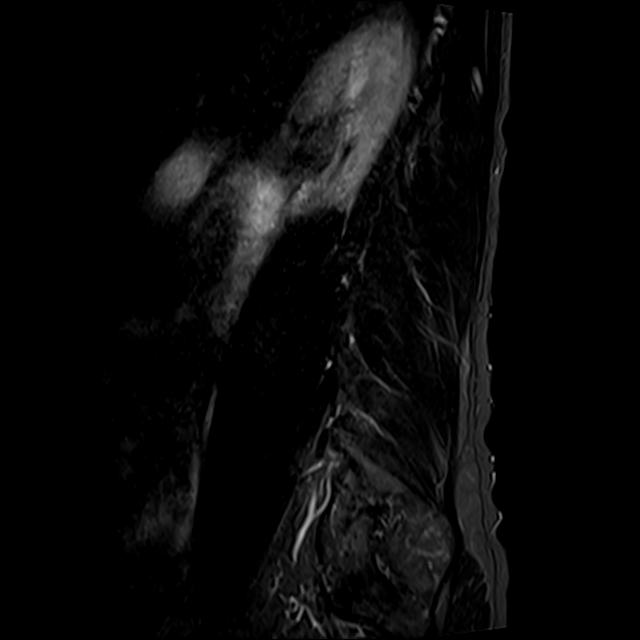

[Series 8: T2 · axial · 4.0mm · 0.78mm/px · z∈[-111,+130]mm · 9 of 40 slices shown (2 of 2)]
[im 1/40]
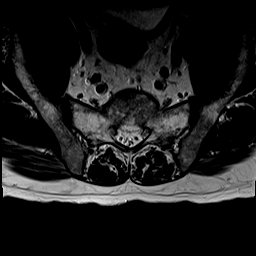
[im 6/40]
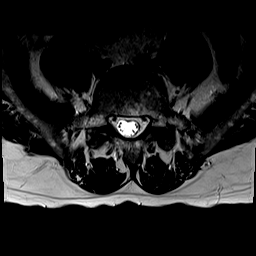
[im 12/40]
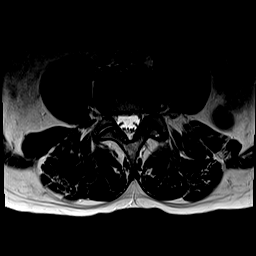
[im 17/40]
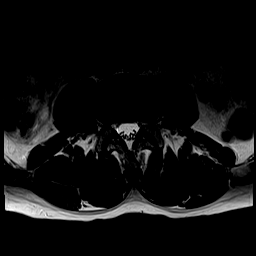
[im 20/40]
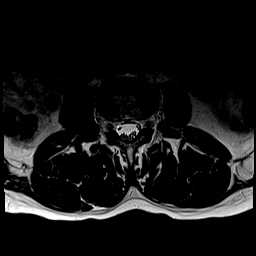
[im 23/40]
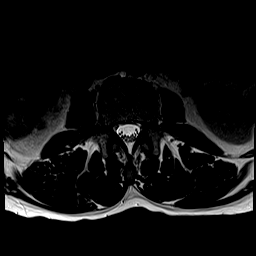
[im 28/40]
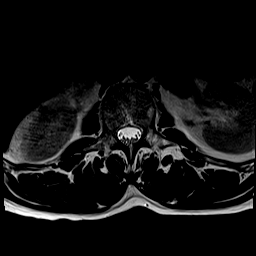
[im 34/40]
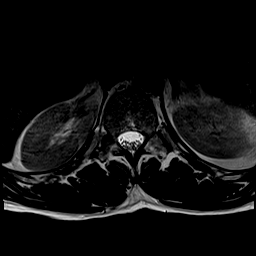
[im 40/40]
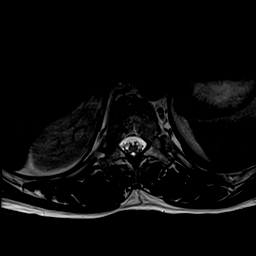

[Series 9: T1 · axial · 4.0mm · 0.39mm/px · z∈[-111,+130]mm · 9 of 40 slices shown (2 of 2)]
[im 1/40]
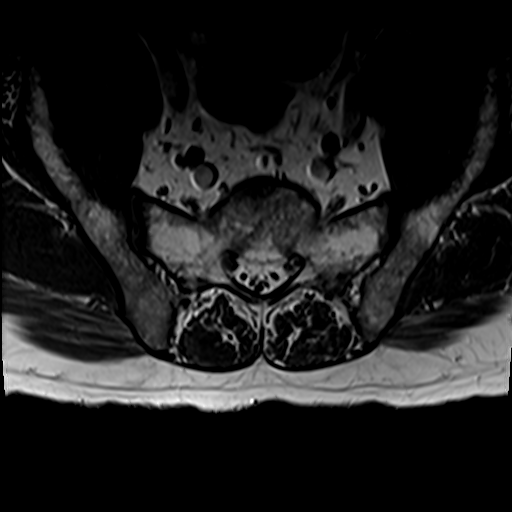
[im 6/40]
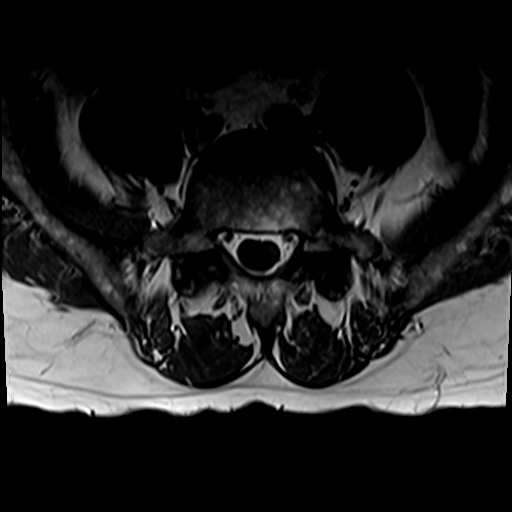
[im 12/40]
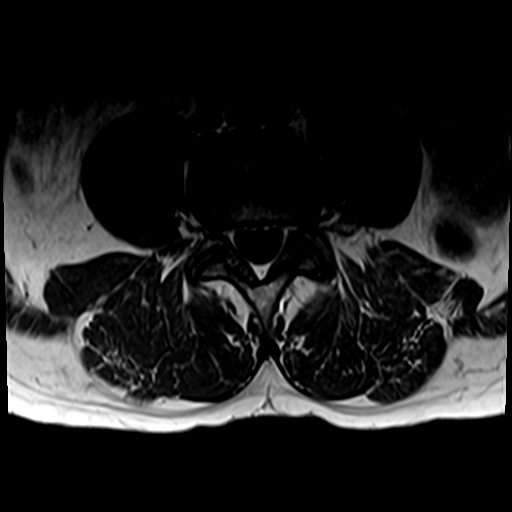
[im 17/40]
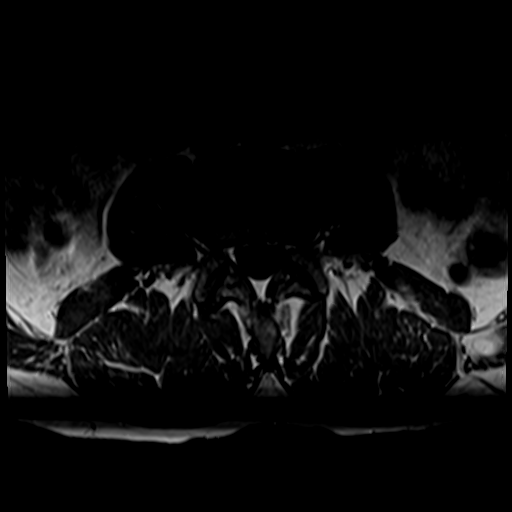
[im 20/40]
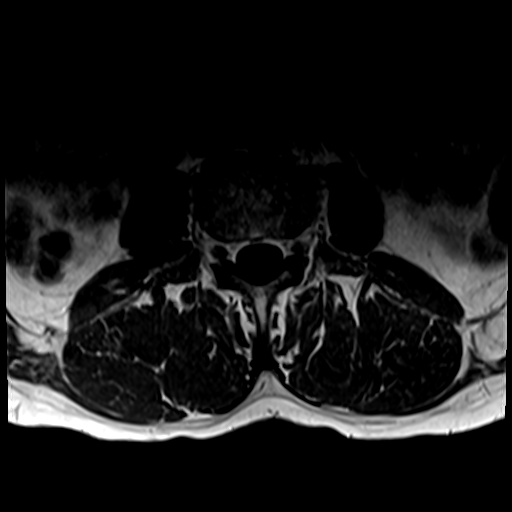
[im 23/40]
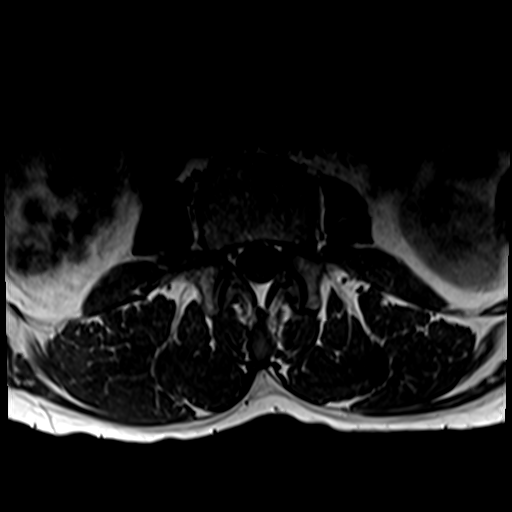
[im 28/40]
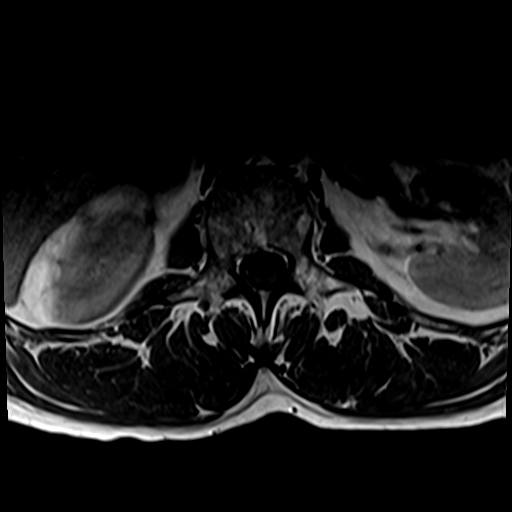
[im 34/40]
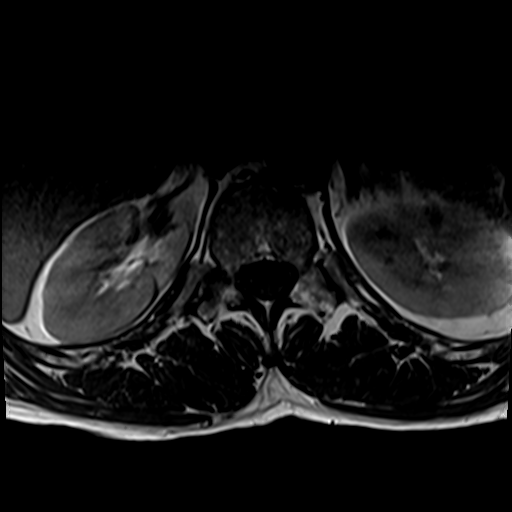
[im 40/40]
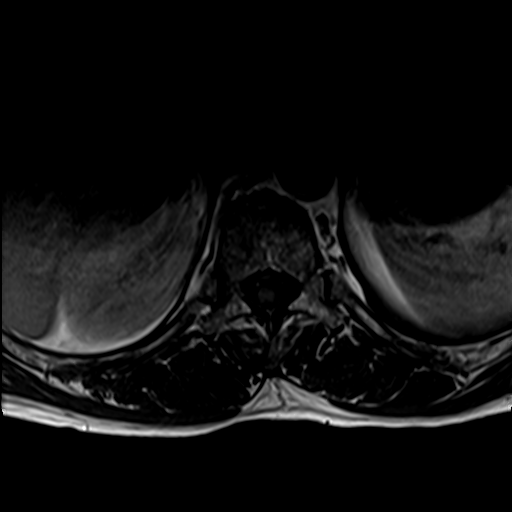

[31 of 48 positions shown; findings below may reference images not displayed]

FINDINGS: Segmentation: Transitional lumbosacral anatomy. In keeping with the
prior exam, the lowest visualized pair of ribs is at T12, and there
is sacralization of the L5 vertebral body, with a rudimentary disc
at L5-S1.

Alignment:  Physiologic.

Vertebrae:  No acute fracture or suspicious osseous lesion.

Conus medullaris and cauda equina: Conus extends to the T12 level.
Conus and cauda equina appear normal.

Paraspinal and other soft tissues: None.

Disc levels:

T12-L1: No significant disc bulge. No spinal canal stenosis or
neural foraminal narrowing.

L1-L2: No significant disc bulge. No spinal canal stenosis or neural
foraminal narrowing.

L2-L3: No significant disc bulge. No spinal canal stenosis or neural
foraminal narrowing.

L3-L4: No significant disc bulge. No spinal canal stenosis or neural
foraminal narrowing.

L4-L5: Moderate to severe facet arthropathy, unchanged. Mild disc
bulge. No spinal canal stenosis. Mild bilateral neural foraminal
narrowing, left-greater-than-right, similar to the prior exam.

L5-S1: No significant disc bulge. No spinal canal stenosis or neural
foraminal narrowing.
IMPRESSION: 1. L4-L5 mild bilateral neural foraminal narrowing, secondary to
moderate to severe facet arthropathy, which are overall unchanged.
2. No spinal canal stenosis.
3. Transitional lumbosacral anatomy. Please correlate with imaging
if any intervention is planned.

## 2023-04-21 DIAGNOSIS — H9201 Otalgia, right ear: Secondary | ICD-10-CM | POA: Diagnosis not present

## 2023-04-21 DIAGNOSIS — Z1389 Encounter for screening for other disorder: Secondary | ICD-10-CM | POA: Diagnosis not present

## 2023-04-29 DIAGNOSIS — K047 Periapical abscess without sinus: Secondary | ICD-10-CM | POA: Diagnosis not present

## 2023-04-29 DIAGNOSIS — Z1389 Encounter for screening for other disorder: Secondary | ICD-10-CM | POA: Diagnosis not present

## 2023-07-20 DIAGNOSIS — M160 Bilateral primary osteoarthritis of hip: Secondary | ICD-10-CM | POA: Diagnosis not present

## 2023-07-20 DIAGNOSIS — Z1331 Encounter for screening for depression: Secondary | ICD-10-CM | POA: Diagnosis not present

## 2023-07-20 DIAGNOSIS — Z1389 Encounter for screening for other disorder: Secondary | ICD-10-CM | POA: Diagnosis not present

## 2023-07-20 DIAGNOSIS — F5221 Male erectile disorder: Secondary | ICD-10-CM | POA: Diagnosis not present

## 2023-08-27 DIAGNOSIS — M25519 Pain in unspecified shoulder: Secondary | ICD-10-CM | POA: Diagnosis not present

## 2023-08-27 DIAGNOSIS — G894 Chronic pain syndrome: Secondary | ICD-10-CM | POA: Diagnosis not present

## 2023-08-27 DIAGNOSIS — M79673 Pain in unspecified foot: Secondary | ICD-10-CM | POA: Diagnosis not present

## 2023-08-27 DIAGNOSIS — M545 Low back pain, unspecified: Secondary | ICD-10-CM | POA: Diagnosis not present

## 2023-08-27 DIAGNOSIS — Z79891 Long term (current) use of opiate analgesic: Secondary | ICD-10-CM | POA: Diagnosis not present

## 2023-09-24 DIAGNOSIS — M545 Low back pain, unspecified: Secondary | ICD-10-CM | POA: Diagnosis not present

## 2023-09-24 DIAGNOSIS — Z79891 Long term (current) use of opiate analgesic: Secondary | ICD-10-CM | POA: Diagnosis not present

## 2023-09-24 DIAGNOSIS — M25512 Pain in left shoulder: Secondary | ICD-10-CM | POA: Diagnosis not present

## 2023-09-24 DIAGNOSIS — G894 Chronic pain syndrome: Secondary | ICD-10-CM | POA: Diagnosis not present

## 2023-09-24 DIAGNOSIS — M961 Postlaminectomy syndrome, not elsewhere classified: Secondary | ICD-10-CM | POA: Diagnosis not present

## 2023-11-06 DIAGNOSIS — Z79891 Long term (current) use of opiate analgesic: Secondary | ICD-10-CM | POA: Diagnosis not present

## 2023-11-06 DIAGNOSIS — G894 Chronic pain syndrome: Secondary | ICD-10-CM | POA: Diagnosis not present

## 2023-11-19 DIAGNOSIS — F4320 Adjustment disorder, unspecified: Secondary | ICD-10-CM | POA: Diagnosis not present

## 2023-11-19 DIAGNOSIS — Z1331 Encounter for screening for depression: Secondary | ICD-10-CM | POA: Diagnosis not present

## 2023-12-04 DIAGNOSIS — M79673 Pain in unspecified foot: Secondary | ICD-10-CM | POA: Diagnosis not present

## 2023-12-04 DIAGNOSIS — M5432 Sciatica, left side: Secondary | ICD-10-CM | POA: Diagnosis not present

## 2023-12-04 DIAGNOSIS — M25519 Pain in unspecified shoulder: Secondary | ICD-10-CM | POA: Diagnosis not present

## 2023-12-04 DIAGNOSIS — M5431 Sciatica, right side: Secondary | ICD-10-CM | POA: Diagnosis not present

## 2023-12-04 DIAGNOSIS — Z79891 Long term (current) use of opiate analgesic: Secondary | ICD-10-CM | POA: Diagnosis not present

## 2023-12-04 DIAGNOSIS — G894 Chronic pain syndrome: Secondary | ICD-10-CM | POA: Diagnosis not present

## 2023-12-15 DIAGNOSIS — M542 Cervicalgia: Secondary | ICD-10-CM | POA: Diagnosis not present

## 2023-12-15 DIAGNOSIS — F4320 Adjustment disorder, unspecified: Secondary | ICD-10-CM | POA: Diagnosis not present

## 2023-12-15 DIAGNOSIS — R945 Abnormal results of liver function studies: Secondary | ICD-10-CM | POA: Diagnosis not present

## 2023-12-15 DIAGNOSIS — Z131 Encounter for screening for diabetes mellitus: Secondary | ICD-10-CM | POA: Diagnosis not present

## 2023-12-15 DIAGNOSIS — Z1389 Encounter for screening for other disorder: Secondary | ICD-10-CM | POA: Diagnosis not present

## 2024-01-01 DIAGNOSIS — G894 Chronic pain syndrome: Secondary | ICD-10-CM | POA: Diagnosis not present

## 2024-01-01 DIAGNOSIS — M5432 Sciatica, left side: Secondary | ICD-10-CM | POA: Diagnosis not present

## 2024-01-01 DIAGNOSIS — M5431 Sciatica, right side: Secondary | ICD-10-CM | POA: Diagnosis not present

## 2024-01-01 DIAGNOSIS — M545 Low back pain, unspecified: Secondary | ICD-10-CM | POA: Diagnosis not present

## 2024-01-01 DIAGNOSIS — Z79891 Long term (current) use of opiate analgesic: Secondary | ICD-10-CM | POA: Diagnosis not present

## 2024-01-29 DIAGNOSIS — G894 Chronic pain syndrome: Secondary | ICD-10-CM | POA: Diagnosis not present

## 2024-01-29 DIAGNOSIS — M5431 Sciatica, right side: Secondary | ICD-10-CM | POA: Diagnosis not present

## 2024-01-29 DIAGNOSIS — M5432 Sciatica, left side: Secondary | ICD-10-CM | POA: Diagnosis not present

## 2024-01-29 DIAGNOSIS — M79673 Pain in unspecified foot: Secondary | ICD-10-CM | POA: Diagnosis not present

## 2024-01-29 DIAGNOSIS — Z79891 Long term (current) use of opiate analgesic: Secondary | ICD-10-CM | POA: Diagnosis not present

## 2024-01-29 DIAGNOSIS — M25519 Pain in unspecified shoulder: Secondary | ICD-10-CM | POA: Diagnosis not present

## 2024-02-09 DIAGNOSIS — M109 Gout, unspecified: Secondary | ICD-10-CM | POA: Diagnosis not present

## 2024-02-09 DIAGNOSIS — F4320 Adjustment disorder, unspecified: Secondary | ICD-10-CM | POA: Diagnosis not present

## 2024-02-09 DIAGNOSIS — M79671 Pain in right foot: Secondary | ICD-10-CM | POA: Diagnosis not present

## 2024-02-09 DIAGNOSIS — Z1389 Encounter for screening for other disorder: Secondary | ICD-10-CM | POA: Diagnosis not present

## 2024-04-13 ENCOUNTER — Emergency Department
Admission: EM | Admit: 2024-04-13 | Discharge: 2024-04-13 | Discharge status: Against Medical Advice | Attending: Emergency Medicine | Admitting: Emergency Medicine

## 2024-04-13 ENCOUNTER — Encounter: Payer: Self-pay | Admitting: Intensive Care

## 2024-04-13 ENCOUNTER — Other Ambulatory Visit: Payer: Self-pay

## 2024-04-13 DIAGNOSIS — R109 Unspecified abdominal pain: Secondary | ICD-10-CM | POA: Diagnosis present

## 2024-04-13 DIAGNOSIS — R197 Diarrhea, unspecified: Secondary | ICD-10-CM | POA: Diagnosis not present

## 2024-04-13 DIAGNOSIS — R112 Nausea with vomiting, unspecified: Secondary | ICD-10-CM | POA: Insufficient documentation

## 2024-04-13 DIAGNOSIS — K5900 Constipation, unspecified: Secondary | ICD-10-CM | POA: Insufficient documentation

## 2024-04-13 DIAGNOSIS — Z5321 Procedure and treatment not carried out due to patient leaving prior to being seen by health care provider: Secondary | ICD-10-CM | POA: Insufficient documentation

## 2024-04-13 LAB — CBC
HCT: 44.8 % (ref 39.0–52.0)
Hemoglobin: 14.7 g/dL (ref 13.0–17.0)
MCH: 30.6 pg (ref 26.0–34.0)
MCHC: 32.8 g/dL (ref 30.0–36.0)
MCV: 93.3 fL (ref 80.0–100.0)
Platelets: 469 K/uL — ABNORMAL HIGH (ref 150–400)
RBC: 4.8 MIL/uL (ref 4.22–5.81)
RDW: 13.5 % (ref 11.5–15.5)
WBC: 7.9 K/uL (ref 4.0–10.5)
nRBC: 0 % (ref 0.0–0.2)

## 2024-04-13 LAB — COMPREHENSIVE METABOLIC PANEL WITH GFR
ALT: 10 U/L (ref 0–44)
AST: 24 U/L (ref 15–41)
Albumin: 4.1 g/dL (ref 3.5–5.0)
Alkaline Phosphatase: 101 U/L (ref 38–126)
Anion gap: 17 — ABNORMAL HIGH (ref 5–15)
BUN: 7 mg/dL (ref 6–20)
CO2: 22 mmol/L (ref 22–32)
Calcium: 10 mg/dL (ref 8.9–10.3)
Chloride: 99 mmol/L (ref 98–111)
Creatinine, Ser: 0.96 mg/dL (ref 0.61–1.24)
GFR, Estimated: >60 mL/min (ref >60)
Glucose, Bld: 129 mg/dL — ABNORMAL HIGH (ref 70–99)
Potassium: 3.6 mmol/L (ref 3.5–5.1)
Sodium: 138 mmol/L (ref 135–145)
Total Bilirubin: 0.8 mg/dL (ref 0.0–1.2)
Total Protein: 9.2 g/dL — ABNORMAL HIGH (ref 6.5–8.1)

## 2024-04-13 LAB — LIPASE, BLOOD: Lipase: 26 U/L (ref 11–51)

## 2024-04-13 MED ORDER — ONDANSETRON 4 MG PO TBDP
4.0000 mg | ORAL_TABLET | Freq: Once | ORAL | Status: AC
  Administered 2024-04-13: 4 mg via ORAL
  Filled 2024-04-13: qty 1

## 2024-04-13 NOTE — ED Triage Notes (Signed)
 Patient reports he was constipated X2 days. Took laxative after lunch and reports he is now having abdominal pain with N/V/D.  Last smoked marijuana this morning

## 2024-04-14 DIAGNOSIS — N529 Male erectile dysfunction, unspecified: Secondary | ICD-10-CM | POA: Insufficient documentation

## 2024-04-14 NOTE — Progress Notes (Deleted)
   04/14/24 12:02 PM   Lucas Yu August 28, 1965 [id number]   HPI: 58 y.o. male here for initial evaluation of ED  Symptoms began: {bglistEDtime:33346} Primary issue: {bglistEDtypes:33347} Libido: {bglistLibido:33348} Relationship w/ partner: *** Rigidity: {bglistEDrigidity:33349} SHIM: ***  Prior therapies:  ***  Smokes marijuana  Tobacco Use: High Risk (04/13/2024)   Patient History    Smoking Tobacco Use: Every Day    Smokeless Tobacco Use: Never    Passive Exposure: Not on file        PMH: Past Medical History:  Diagnosis Date   Hypertension     Surgical History: No past surgical history on file.  Family History: No family history on file.  Social History:  reports that he has been smoking cigarettes. He has a 44 pack-year smoking history. He has never used smokeless tobacco. He reports current alcohol use of about 7.0 standard drinks of alcohol per week. He reports current drug use. Drug: Marijuana.      Physical Exam: There were no vitals taken for this visit.   Constitutional:  Alert and oriented, No acute distress. Cardiovascular: No clubbing, cyanosis, or edema. Respiratory: Normal respiratory effort, no increased work of breathing. GI: Nondistended Skin: No rashes, bruises or suspicious lesions. Neurologic: Grossly intact, no focal deficits, moving all 4 extremities. Psychiatric: Normal mood and affect.  Laboratory Data: N/A   Pertinent Imaging: I have personally viewed and interpreted the ***.    Assessment & Plan:    ED (erectile dysfunction) of organic origin Assessment & Plan: We reviewed the basic tenets of erectile dysfunction management, including normal erectile physiology, common contributing factors, and the spectrum of therapeutic options. Conservative measures such as lifestyle modification, optimization of comorbid conditions, and avoidance of exacerbating medications were discussed. Pharmacologic options including PDE5  inhibitors, intracavernosal or intraurethral therapies, and vacuum erection devices were reviewed, as well as surgical approaches such as penile prosthesis implantation. All questions were answered.        Lucas Skye, MD 04/14/2024  [facility] [address] [place], [place] [address] [phone]

## 2024-04-14 NOTE — Assessment & Plan Note (Deleted)
 We reviewed the basic tenets of erectile dysfunction management, including normal erectile physiology, common contributing factors, and the spectrum of therapeutic options. Conservative measures such as lifestyle modification, optimization of comorbid conditions, and avoidance of exacerbating medications were discussed. Pharmacologic options including PDE5 inhibitors, intracavernosal or intraurethral therapies, and vacuum erection devices were reviewed, as well as surgical approaches such as penile prosthesis implantation. All questions were answered.

## 2024-04-21 ENCOUNTER — Ambulatory Visit: Admitting: Urology

## 2024-04-21 ENCOUNTER — Encounter: Payer: Self-pay | Admitting: Emergency Medicine

## 2024-04-21 ENCOUNTER — Emergency Department

## 2024-04-21 ENCOUNTER — Emergency Department
Admission: EM | Admit: 2024-04-21 | Discharge: 2024-04-21 | Disposition: A | Discharge status: Home / Self Care | Attending: Emergency Medicine | Admitting: Emergency Medicine

## 2024-04-21 ENCOUNTER — Other Ambulatory Visit: Payer: Self-pay

## 2024-04-21 DIAGNOSIS — R1010 Upper abdominal pain, unspecified: Secondary | ICD-10-CM | POA: Diagnosis present

## 2024-04-21 DIAGNOSIS — N529 Male erectile dysfunction, unspecified: Secondary | ICD-10-CM

## 2024-04-21 DIAGNOSIS — R1012 Left upper quadrant pain: Secondary | ICD-10-CM | POA: Insufficient documentation

## 2024-04-21 DIAGNOSIS — K2970 Gastritis, unspecified, without bleeding: Secondary | ICD-10-CM

## 2024-04-21 DIAGNOSIS — R1013 Epigastric pain: Secondary | ICD-10-CM | POA: Diagnosis not present

## 2024-04-21 DIAGNOSIS — R7989 Other specified abnormal findings of blood chemistry: Secondary | ICD-10-CM | POA: Insufficient documentation

## 2024-04-21 DIAGNOSIS — I10 Essential (primary) hypertension: Secondary | ICD-10-CM | POA: Diagnosis not present

## 2024-04-21 DIAGNOSIS — R112 Nausea with vomiting, unspecified: Secondary | ICD-10-CM | POA: Insufficient documentation

## 2024-04-21 LAB — CBC
HCT: 40.9 % (ref 39.0–52.0)
Hemoglobin: 13.1 g/dL (ref 13.0–17.0)
MCH: 30 pg (ref 26.0–34.0)
MCHC: 32 g/dL (ref 30.0–36.0)
MCV: 93.8 fL (ref 80.0–100.0)
Platelets: 201 K/uL (ref 150–400)
RBC: 4.36 MIL/uL (ref 4.22–5.81)
RDW: 13.7 % (ref 11.5–15.5)
WBC: 7.4 K/uL (ref 4.0–10.5)
nRBC: 0 % (ref 0.0–0.2)

## 2024-04-21 LAB — URINALYSIS, ROUTINE W REFLEX MICROSCOPIC
Bilirubin Urine: NEGATIVE
Glucose, UA: NEGATIVE mg/dL
Hgb urine dipstick: NEGATIVE
Ketones, ur: NEGATIVE mg/dL
Leukocytes,Ua: NEGATIVE
Nitrite: NEGATIVE
Protein, ur: NEGATIVE mg/dL
Specific Gravity, Urine: >1.046 — ABNORMAL HIGH (ref 1.005–1.030)
pH: 6 (ref 5.0–8.0)

## 2024-04-21 LAB — COMPREHENSIVE METABOLIC PANEL WITH GFR
ALT: 9 U/L (ref 0–44)
AST: 20 U/L (ref 15–41)
Albumin: 4 g/dL (ref 3.5–5.0)
Alkaline Phosphatase: 87 U/L (ref 38–126)
Anion gap: 15 (ref 5–15)
BUN: 8 mg/dL (ref 6–20)
CO2: 21 mmol/L — ABNORMAL LOW (ref 22–32)
Calcium: 9.5 mg/dL (ref 8.9–10.3)
Chloride: 100 mmol/L (ref 98–111)
Creatinine, Ser: 0.88 mg/dL (ref 0.61–1.24)
GFR, Estimated: >60 mL/min (ref >60)
Glucose, Bld: 120 mg/dL — ABNORMAL HIGH (ref 70–99)
Potassium: 4.2 mmol/L (ref 3.5–5.1)
Sodium: 136 mmol/L (ref 135–145)
Total Bilirubin: 0.6 mg/dL (ref 0.0–1.2)
Total Protein: 9.3 g/dL — ABNORMAL HIGH (ref 6.5–8.1)

## 2024-04-21 LAB — TROPONIN I (HIGH SENSITIVITY): Troponin I (High Sensitivity): 23 ng/L — ABNORMAL HIGH (ref <18)

## 2024-04-21 LAB — LIPASE, BLOOD: Lipase: 26 U/L (ref 11–51)

## 2024-04-21 MED ORDER — ONDANSETRON HCL 4 MG/2ML IJ SOLN
4.0000 mg | Freq: Once | INTRAMUSCULAR | Status: AC
  Administered 2024-04-21: 4 mg via INTRAVENOUS
  Filled 2024-04-21: qty 2

## 2024-04-21 MED ORDER — ALUM & MAG HYDROXIDE-SIMETH 200-200-20 MG/5ML PO SUSP
30.0000 mL | Freq: Once | ORAL | Status: AC
  Administered 2024-04-21: 30 mL via ORAL
  Filled 2024-04-21: qty 30

## 2024-04-21 MED ORDER — SUCRALFATE 1 G PO TABS: 1.0000 g | ORAL_TABLET | Freq: Four times a day (QID) | ORAL | 0 refills | Status: AC

## 2024-04-21 MED ORDER — SODIUM CHLORIDE 0.9 % IV BOLUS
1000.0000 mL | Freq: Once | INTRAVENOUS | Status: AC
  Administered 2024-04-21: 1000 mL via INTRAVENOUS

## 2024-04-21 MED ORDER — LIDOCAINE VISCOUS HCL 2 % MT SOLN
15.0000 mL | Freq: Once | OROMUCOSAL | Status: AC
  Administered 2024-04-21: 15 mL via ORAL
  Filled 2024-04-21: qty 15

## 2024-04-21 MED ORDER — MORPHINE SULFATE (PF) 4 MG/ML IV SOLN
4.0000 mg | Freq: Once | INTRAVENOUS | Status: AC
  Administered 2024-04-21: 4 mg via INTRAVENOUS
  Filled 2024-04-21: qty 1

## 2024-04-21 MED ORDER — PANTOPRAZOLE SODIUM 40 MG PO TBEC: 40.0000 mg | DELAYED_RELEASE_TABLET | Freq: Every day | ORAL | 1 refills | Status: DC

## 2024-04-21 MED ORDER — IOHEXOL 300 MG/ML  SOLN
100.0000 mL | Freq: Once | INTRAMUSCULAR | Status: AC | PRN
  Administered 2024-04-21: 100 mL via INTRAVENOUS

## 2024-04-21 NOTE — ED Provider Notes (Signed)
 [facility] Provider Note    Event Date/Time   First MD Initiated Contact with Patient 04/21/24 0932     (approximate)  History   Chief Complaint: Abdominal Pain  HPI  Lucas Yu is a 58 y.o. male with a past medical history of hypertension who presents to the emergency department for upper abdominal pain nausea and vomiting.  According to the patient of the past 1 week he has been experiencing aching or burning pain in the upper abdomen along with nausea and vomiting.  Patient states frequent episodes of gastric reflux as well.  Patient does drink alcohol but not on a daily basis per patient.  No bloody or black vomitus or stool.  No fever.  Physical Exam   Triage Vital Signs: ED Triage Vitals  Encounter Vitals Group     BP 04/21/24 0922 (!) 149/94     Girls Systolic BP Percentile --      Girls Diastolic BP Percentile --      Boys Systolic BP Percentile --      Boys Diastolic BP Percentile --      Pulse Rate 04/21/24 0922 77     Resp 04/21/24 0922 18     Temp 04/21/24 0922 99 F (37.2 C)     Temp Source 04/21/24 0922 Oral     SpO2 04/21/24 0922 99 %     Weight 04/21/24 0922 160 lb (72.6 kg)     Height 04/21/24 0922 5' 7 (1.702 m)     Head Circumference --      Peak Flow --      Pain Score 04/21/24 0925 10     Pain Loc --      Pain Education --      Exclude from Growth Chart --     Most recent vital signs: Vitals:   04/21/24 0922  BP: (!) 149/94  Pulse: 77  Resp: 18  Temp: 99 F (37.2 C)  SpO2: 99%    General: Awake, no distress.  CV:  Good peripheral perfusion.  Regular rate and rhythm  Resp:  Normal effort.  Equal breath sounds bilaterally.  Abd:  No distention.  Soft, moderate epigastric tenderness.  Mild left upper quadrant tenderness.  No right upper quadrant tenderness.  No rebound or guarding.   ED Results / Procedures / Treatments   EKG  EKG viewed and interpreted by myself shows a normal sinus rhythm at 79 bpm with a  narrow QRS, normal axis, normal intervals, nonspecific ST changes.  RADIOLOGY  I have reviewed interpreted CT images.  No obvious obstruction or significant abnormality seen on my evaluation. Radiology has read the CT is negative for acute finding.  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  I discussed these with the patient he will follow-up with his doctor for MRI imaging.   MEDICATIONS ORDERED IN ED: Medications  alum & mag hydroxide-simeth (MAALOX/MYLANTA) 200-200-20 MG/5ML suspension 30 mL (has no administration in time range)    And  lidocaine  (XYLOCAINE ) 2 % viscous mouth solution 15 mL (has no administration in time range)  ondansetron  (ZOFRAN ) injection 4 mg (has no administration in time range)  morphine (PF) 4 MG/ML injection 4 mg (has no administration in time range)  sodium chloride 0.9 % bolus 1,000 mL (has no administration in time range)     IMPRESSION / MDM / ASSESSMENT AND PLAN / ED COURSE  I reviewed the triage vital signs  and the nursing notes.  Patient's presentation is most consistent with acute presentation with potential threat to life or bodily function.  Patient presents emergency department for upper abdominal pain nausea and vomiting.  Symptoms have been ongoing x 1 week.  Differential include pancreatitis, gastritis, esophagitis, peptic ulcer disease.  We will check labs including a CBC chemistry lipase will also add on a troponin and EKG given proximity to the chest.  Will dose a GI cocktail pain nausea medication IV fluids and reassess after labs are known.  Patient agreeable to plan of care.  Patient's workup shows a reassuring CBC reassuring chemistry very minimal troponin elevation, but no chest pain.  Patient states he is ready to go home.  His CT scan has resulted showing a couple incidental findings of an adrenal mass as well as finding in the liver I discussed this with the patient and he will follow-up with his PCP for  further imaging.  Patient states immediate resolution of pain following GI cocktail highly suspect gastritis to be the cause of his symptoms.  We will prescribe Protonix and sucralfate we will have the patient follow-up with his doctor.  Discussed very strict return precautions for any chest pain any worsening discomfort.    FINAL CLINICAL IMPRESSION(S) / ED DIAGNOSES   Epigastric pain Nausea vomiting   Note:  This document was prepared using Dragon voice recognition software and may include unintentional dictation errors.   Dorothyann Drivers, MD 04/21/24 [phone]

## 2024-04-21 NOTE — Discharge Instructions (Signed)
 As we discussed please take your medication as prescribed.  Return to the emergency department for any chest pain, trouble breathing any worsening abdominal pain, or any other symptom personally concerning to yourself.  Otherwise please follow-up with your primary care doctor within the next 2 to 3 days for recheck/reevaluation.  As we discussed you did have a small adrenal mass on your right adrenal gland as well as a small finding in the liver.  These need to be further evaluated with MRIs by your primary care doctor.  I have attached your CT report below for you to bring to your primary care doctor.   IMPRESSION: No acute findings.   Colonic diverticulosis, without radiographic evidence of diverticulitis.   Indeterminate right adrenal mass. Recommend abdomen MRI without and with contrast for further characterization.   Focal area of hyperenhancement in left hepatic lobe, which is suggestive of a vascular shunt, with hepatic mass considered less likely. This can also be further characterized by MRI.

## 2024-04-21 NOTE — ED Triage Notes (Signed)
 Patient to ED via POV for upper abd pain. States worse when laying down and feels like a knot. Also having N/V.

## 2024-04-29 ENCOUNTER — Other Ambulatory Visit: Payer: Self-pay | Admitting: Nurse Practitioner

## 2024-04-29 DIAGNOSIS — E278 Other specified disorders of adrenal gland: Secondary | ICD-10-CM

## 2024-05-03 ENCOUNTER — Inpatient Hospital Stay (HOSPITAL_COMMUNITY)

## 2024-05-03 ENCOUNTER — Emergency Department
Admission: EM | Admit: 2024-05-03 | Discharge: 2024-05-03 | Disposition: A | Discharge status: Other Acute Inpatient Hospital | Attending: Emergency Medicine | Admitting: Emergency Medicine

## 2024-05-03 ENCOUNTER — Encounter: Payer: Self-pay | Admitting: Emergency Medicine

## 2024-05-03 ENCOUNTER — Emergency Department

## 2024-05-03 ENCOUNTER — Inpatient Hospital Stay (HOSPITAL_COMMUNITY)
Admission: EM | Admit: 2024-05-03 | Discharge: 2024-05-05 | DRG: 166 | Disposition: A | Discharge status: Home Health | Attending: Internal Medicine | Admitting: Internal Medicine

## 2024-05-03 ENCOUNTER — Other Ambulatory Visit: Payer: Self-pay

## 2024-05-03 ENCOUNTER — Encounter (HOSPITAL_COMMUNITY): Payer: Self-pay

## 2024-05-03 DIAGNOSIS — F17200 Nicotine dependence, unspecified, uncomplicated: Secondary | ICD-10-CM | POA: Diagnosis not present

## 2024-05-03 DIAGNOSIS — C3411 Malignant neoplasm of upper lobe, right bronchus or lung: Secondary | ICD-10-CM | POA: Diagnosis present

## 2024-05-03 DIAGNOSIS — R29810 Facial weakness: Secondary | ICD-10-CM | POA: Diagnosis not present

## 2024-05-03 DIAGNOSIS — R531 Weakness: Secondary | ICD-10-CM | POA: Insufficient documentation

## 2024-05-03 DIAGNOSIS — C7931 Secondary malignant neoplasm of brain: Secondary | ICD-10-CM | POA: Diagnosis present

## 2024-05-03 DIAGNOSIS — R1310 Dysphagia, unspecified: Secondary | ICD-10-CM | POA: Insufficient documentation

## 2024-05-03 DIAGNOSIS — R900 Intracranial space-occupying lesion found on diagnostic imaging of central nervous system: Secondary | ICD-10-CM

## 2024-05-03 DIAGNOSIS — C7971 Secondary malignant neoplasm of right adrenal gland: Secondary | ICD-10-CM | POA: Diagnosis present

## 2024-05-03 DIAGNOSIS — G936 Cerebral edema: Secondary | ICD-10-CM | POA: Diagnosis present

## 2024-05-03 DIAGNOSIS — F17210 Nicotine dependence, cigarettes, uncomplicated: Secondary | ICD-10-CM | POA: Diagnosis present

## 2024-05-03 DIAGNOSIS — Z79899 Other long term (current) drug therapy: Secondary | ICD-10-CM

## 2024-05-03 DIAGNOSIS — R471 Dysarthria and anarthria: Secondary | ICD-10-CM | POA: Diagnosis not present

## 2024-05-03 DIAGNOSIS — Z880 Allergy status to penicillin: Secondary | ICD-10-CM | POA: Diagnosis not present

## 2024-05-03 DIAGNOSIS — G9389 Other specified disorders of brain: Secondary | ICD-10-CM | POA: Diagnosis not present

## 2024-05-03 DIAGNOSIS — Z87891 Personal history of nicotine dependence: Secondary | ICD-10-CM

## 2024-05-03 DIAGNOSIS — I10 Essential (primary) hypertension: Secondary | ICD-10-CM | POA: Diagnosis present

## 2024-05-03 DIAGNOSIS — R918 Other nonspecific abnormal finding of lung field: Secondary | ICD-10-CM | POA: Diagnosis not present

## 2024-05-03 DIAGNOSIS — C781 Secondary malignant neoplasm of mediastinum: Secondary | ICD-10-CM | POA: Diagnosis present

## 2024-05-03 DIAGNOSIS — C7972 Secondary malignant neoplasm of left adrenal gland: Secondary | ICD-10-CM | POA: Diagnosis present

## 2024-05-03 DIAGNOSIS — R29898 Other symptoms and signs involving the musculoskeletal system: Secondary | ICD-10-CM

## 2024-05-03 DIAGNOSIS — R791 Abnormal coagulation profile: Secondary | ICD-10-CM | POA: Diagnosis not present

## 2024-05-03 DIAGNOSIS — R200 Anesthesia of skin: Secondary | ICD-10-CM

## 2024-05-03 DIAGNOSIS — D496 Neoplasm of unspecified behavior of brain: Secondary | ICD-10-CM | POA: Diagnosis present

## 2024-05-03 DIAGNOSIS — R4781 Slurred speech: Secondary | ICD-10-CM | POA: Diagnosis present

## 2024-05-03 DIAGNOSIS — C3490 Malignant neoplasm of unspecified part of unspecified bronchus or lung: Secondary | ICD-10-CM | POA: Diagnosis not present

## 2024-05-03 DIAGNOSIS — J439 Emphysema, unspecified: Secondary | ICD-10-CM | POA: Diagnosis present

## 2024-05-03 LAB — COMPREHENSIVE METABOLIC PANEL WITH GFR
ALT: <5 U/L (ref 0–44)
AST: 21 U/L (ref 15–41)
Albumin: 4.1 g/dL (ref 3.5–5.0)
Alkaline Phosphatase: 107 U/L (ref 38–126)
Anion gap: 13 (ref 5–15)
BUN: 7 mg/dL (ref 6–20)
CO2: 26 mmol/L (ref 22–32)
Calcium: 9.9 mg/dL (ref 8.9–10.3)
Chloride: 100 mmol/L (ref 98–111)
Creatinine, Ser: 0.87 mg/dL (ref 0.61–1.24)
GFR, Estimated: >60 mL/min (ref >60)
Glucose, Bld: 115 mg/dL — ABNORMAL HIGH (ref 70–99)
Potassium: 3.9 mmol/L (ref 3.5–5.1)
Sodium: 138 mmol/L (ref 135–145)
Total Bilirubin: 0.4 mg/dL (ref 0.0–1.2)
Total Protein: 8.3 g/dL — ABNORMAL HIGH (ref 6.5–8.1)

## 2024-05-03 LAB — CBC
HCT: 40.4 % (ref 39.0–52.0)
Hemoglobin: 13.2 g/dL (ref 13.0–17.0)
MCH: 30.3 pg (ref 26.0–34.0)
MCHC: 32.7 g/dL (ref 30.0–36.0)
MCV: 92.9 fL (ref 80.0–100.0)
Platelets: 488 K/uL — ABNORMAL HIGH (ref 150–400)
RBC: 4.35 MIL/uL (ref 4.22–5.81)
RDW: 13.2 % (ref 11.5–15.5)
WBC: 7.2 K/uL (ref 4.0–10.5)
nRBC: 0 % (ref 0.0–0.2)

## 2024-05-03 LAB — DIFFERENTIAL
Abs Immature Granulocytes: 0.02 K/uL (ref 0.00–0.07)
Basophils Absolute: 0 K/uL (ref 0.0–0.1)
Basophils Relative: 0 %
Eosinophils Absolute: 0 K/uL (ref 0.0–0.5)
Eosinophils Relative: 1 %
Immature Granulocytes: 0 %
Lymphocytes Relative: 31 %
Lymphs Abs: 2.2 K/uL (ref 0.7–4.0)
Monocytes Absolute: 0.9 K/uL (ref 0.1–1.0)
Monocytes Relative: 13 %
Neutro Abs: 4 K/uL (ref 1.7–7.7)
Neutrophils Relative %: 55 %

## 2024-05-03 LAB — PROTIME-INR
INR: 1.1 (ref 0.8–1.2)
Prothrombin Time: 15.2 s (ref 11.4–15.2)

## 2024-05-03 LAB — APTT: aPTT: 33 s (ref 24–36)

## 2024-05-03 LAB — ETHANOL: Alcohol, Ethyl (B): <15 mg/dL (ref <15)

## 2024-05-03 LAB — GLUCOSE, CAPILLARY: Glucose-Capillary: 177 mg/dL — ABNORMAL HIGH (ref 70–99)

## 2024-05-03 MED ORDER — DEXAMETHASONE SOD PHOSPHATE PF 10 MG/ML IJ SOLN
10.0000 mg | Freq: Once | INTRAMUSCULAR | Status: AC
  Administered 2024-05-03: 10 mg via INTRAVENOUS

## 2024-05-03 MED ORDER — OXYCODONE HCL 5 MG PO TABS
10.0000 mg | ORAL_TABLET | Freq: Once | ORAL | Status: AC
Start: 2024-05-03 — End: 2024-05-03
  Administered 2024-05-03: 10 mg via ORAL
  Filled 2024-05-03: qty 2

## 2024-05-03 MED ORDER — IOHEXOL 350 MG/ML SOLN
50.0000 mL | Freq: Once | INTRAVENOUS | Status: AC | PRN
  Administered 2024-05-03: 50 mL via INTRAVENOUS

## 2024-05-03 MED ORDER — CYCLOBENZAPRINE HCL 10 MG PO TABS
10.0000 mg | ORAL_TABLET | Freq: Once | ORAL | Status: AC
  Administered 2024-05-03: 10 mg via ORAL
  Filled 2024-05-03: qty 1

## 2024-05-03 MED ORDER — OXYCODONE HCL 5 MG PO TABS
5.0000 mg | ORAL_TABLET | Freq: Once | ORAL | Status: DC | PRN
  Filled 2024-05-03 (×2): qty 1

## 2024-05-03 MED ORDER — GADOBUTROL 1 MMOL/ML IV SOLN
7.0000 mL | Freq: Once | INTRAVENOUS | Status: AC | PRN
  Administered 2024-05-03: 7 mL via INTRAVENOUS

## 2024-05-03 MED ORDER — DEXAMETHASONE SODIUM PHOSPHATE 4 MG/ML IJ SOLN
4.0000 mg | Freq: Four times a day (QID) | INTRAMUSCULAR | Status: DC
  Administered 2024-05-03 – 2024-05-05 (×7): 4 mg via INTRAVENOUS
  Filled 2024-05-03 (×7): qty 1

## 2024-05-03 MED ORDER — ACETAMINOPHEN 325 MG PO TABS: 650.0000 mg | ORAL_TABLET | Freq: Four times a day (QID) | ORAL | Status: DC | PRN

## 2024-05-03 MED ORDER — OXYCODONE HCL 5 MG PO TABS
10.0000 mg | ORAL_TABLET | Freq: Once | ORAL | Status: AC
  Administered 2024-05-03: 10 mg via ORAL
  Filled 2024-05-03: qty 2

## 2024-05-03 MED ORDER — NICOTINE 7 MG/24HR TD PT24
7.0000 mg | MEDICATED_PATCH | Freq: Every day | TRANSDERMAL | Status: DC
  Administered 2024-05-03 – 2024-05-05 (×3): 7 mg via TRANSDERMAL
  Filled 2024-05-03 (×4): qty 1

## 2024-05-03 MED ORDER — NALOXONE HCL 0.4 MG/ML IJ SOLN: 0.4000 mg | INTRAMUSCULAR | Status: DC | PRN

## 2024-05-03 MED ORDER — LORAZEPAM 2 MG/ML IJ SOLN
0.5000 mg | Freq: Once | INTRAMUSCULAR | Status: AC
  Administered 2024-05-03: 0.5 mg via INTRAVENOUS
  Filled 2024-05-03: qty 1

## 2024-05-03 MED ORDER — ACETAMINOPHEN 650 MG RE SUPP: 650.0000 mg | Freq: Four times a day (QID) | RECTAL | Status: DC | PRN

## 2024-05-03 NOTE — ED Triage Notes (Signed)
 Patient to ED via POV for stroke like symptoms. PT reports [facility] 10/31. Pt reports slurred speech, facial droop, dysphagia and left sided numbness/weakness. Denies vision changed. Aox4, ambulatory to triage.

## 2024-05-03 NOTE — Progress Notes (Signed)
   05/03/24 1900  Vitals  Temp 97.7 F (36.5 C)  Temp Source Oral  BP (!) 138/98  MAP (mmHg) 105  BP Location Left Arm  BP Method Automatic  Patient Position (if appropriate) Lying  Pulse Rate 76  Pulse Rate Source Monitor  ECG Heart Rate 76  Resp 18  Level of Consciousness  Level of Consciousness Alert  MEWS COLOR  MEWS Score Color Green  Oxygen Therapy  SpO2 93 %  O2 Device Room Air  MEWS Score  MEWS Temp 0  MEWS Systolic 0  MEWS Pulse 0  MEWS RR 0  MEWS LOC 0  MEWS Score 0   Pt admitted to unit from [facility] ED. Pt A&Ox4, MAEx4. Skin assesed with second RN per hospital policy with no pressure injuires noted. PIV clean and dry. Pt oriented to unit, room, and call light system. Admitting MD notified of pt's arrival to unit. Report given to oncoming shift.

## 2024-05-03 NOTE — ED Provider Notes (Signed)
 [facility] Provider Note   Event Date/Time   First MD Initiated Contact with Patient 05/03/24 1229     (approximate) History  Aphasia  HPI Lucas Yu is a 58 y.o. male with a stated past medical history of hypertension and tobacco abuse who presents from via private vehicle due to multiple neurologic complaints including facial droop, slurred speech, difficulty swallowing, and left-sided numbness/weakness in the upper and lower extremity.  Patient states that these symptoms began on 04/22/2024 and have been intermittent however have been worsening since onset.  Patient now has persistent left lower extremity weakness, left-sided facial droop, dysarthria, and mild dysphagia. ROS: Patient currently denies any vision changes, tinnitus, difficulty speaking, facial droop, sore throat, chest pain, shortness of breath, abdominal pain, nausea/vomiting/diarrhea, dysuria, or numbness/paresthesias in any extremity   Physical Exam  Triage Vital Signs: ED Triage Vitals  Encounter Vitals Group     BP 05/03/24 1145 (!) 150/94     Girls Systolic BP Percentile --      Girls Diastolic BP Percentile --      Boys Systolic BP Percentile --      Boys Diastolic BP Percentile --      Pulse Rate 05/03/24 1145 83     Resp 05/03/24 1145 18     Temp 05/03/24 1145 97.8 F (36.6 C)     Temp Source 05/03/24 1145 Oral     SpO2 05/03/24 1145 100 %     Weight 05/03/24 1150 158 lb (71.7 kg)     Height 05/03/24 1150 5' 7 (1.702 m)     Head Circumference --      Peak Flow --      Pain Score 05/03/24 1150 0     Pain Loc --      Pain Education --      Exclude from Growth Chart --    Most recent vital signs: Vitals:   05/03/24 1243 05/03/24 1300  BP: (!) 151/95 (!) 139/98  Pulse: 84 71  Resp: 17 17  Temp:    SpO2: 100% 100%   General: Awake, oriented x4. CV:  Good peripheral perfusion. Resp:  Normal effort. Abd:  No distention. Other:  Middle-age well-developed, well-nourished  African-American male resting comfortably in no acute distress.  NIHSS 5 for facial droop, dysarthria, lower extremity weakness ED Results / Procedures / Treatments  Labs (all labs ordered are listed, but only abnormal results are displayed) Labs Reviewed  CBC - Abnormal; Notable for the following components:      Result Value   Platelets 488 (*)    All other components within normal limits  COMPREHENSIVE METABOLIC PANEL WITH GFR - Abnormal; Notable for the following components:   Glucose, Bld 115 (*)    Total Protein 8.3 (*)    All other components within normal limits  PROTIME-INR  APTT  DIFFERENTIAL  ETHANOL  I-STAT CREATININE, ED  CBG MONITORING, ED   EKG ED ECG REPORT I, Artist MARLA Kerns, the attending physician, personally viewed and interpreted this ECG. Date: 05/03/2024 EKG Time: 1152 Rate: 80 Rhythm: normal sinus rhythm QRS Axis: normal Intervals: normal ST/T Wave abnormalities: normal Narrative Interpretation: no evidence of acute ischemia RADIOLOGY ED MD interpretation: CT of the head without contrast shows right frontal and left parietal masses with moderate vasogenic edema - All radiology independently interpreted and agree with radiology assessment Official radiology report(s): CT HEAD WO CONTRAST Result Date: 05/03/2024 EXAM: CT HEAD WITHOUT CONTRAST 05/03/2024 12:06:07 PM TECHNIQUE: CT  of the head was performed without the administration of intravenous contrast. Automated exposure control, iterative reconstruction, and/or weight based adjustment of the mA/kV was utilized to reduce the radiation dose to as low as reasonably achievable. COMPARISON: None available. CLINICAL HISTORY: Neuro deficit, acute, stroke suspected. FINDINGS: BRAIN AND VENTRICLES: Hyperdense masses in the posterior right frontal lobe and left parietal lobe measure 1.3 cm and 1.0 cm, respectively. Associated moderate vasogenic edema is greater in the right frontal lobe. There is mild regional mass  effect without significant midline shift. Elsewhere, no acute infarct, intracranial hemorrhage, hydrocephalus, or extra-axial fluid collection is identified. Cerebral volume is normal. ORBITS: No acute abnormality. SINUSES: No acute abnormality. SOFT TISSUES AND SKULL: No acute soft tissue abnormality. No skull fracture. IMPRESSION: 1. Right frontal and left parietal masses with moderate vasogenic edema. A brain MRI without and with IV contrast is recommended for further evaluation. Electronically signed by: Dasie Hamburg MD 05/03/2024 12:21 PM EST RP Workstation: [id number]   PROCEDURES: Critical Care performed: Yes, see critical care procedure note(s) .1-3 Lead EKG Interpretation  Performed by: Jossie Artist POUR, MD Authorized by: Jossie Artist POUR, MD     Interpretation: normal     ECG rate:  71   ECG rate assessment: normal     Rhythm: sinus rhythm     Ectopy: none     Conduction: normal   CRITICAL CARE Performed by: Allan Minotti K Jacquis Paxton  Total critical care time: 39 minutes  Critical care time was exclusive of separately billable procedures and treating other patients.  Critical care was necessary to treat or prevent imminent or life-threatening deterioration.  Critical care was time spent personally by me on the following activities: development of treatment plan with patient and/or surrogate as well as nursing, discussions with consultants, evaluation of patient's response to treatment, examination of patient, obtaining history from patient or surrogate, ordering and performing treatments and interventions, ordering and review of laboratory studies, ordering and review of radiographic studies, pulse oximetry and re-evaluation of patient's condition.  MEDICATIONS ORDERED IN ED: Medications  LORazepam (ATIVAN) injection 0.5 mg (0.5 mg Intravenous Given 05/03/24 1323)  gadobutrol (GADAVIST) 1 MMOL/ML injection 7 mL (7 mLs Intravenous Contrast Given 05/03/24 1354)  oxyCODONE  (Oxy IR/ROXICODONE )  immediate release tablet 10 mg (10 mg Oral Given 05/03/24 1427)  cyclobenzaprine  (FLEXERIL ) tablet 10 mg (10 mg Oral Given 05/03/24 1427)   IMPRESSION / MDM / ASSESSMENT AND PLAN / ED COURSE  I reviewed the triage vital signs and the nursing notes.                             The patient is on the cardiac monitor to evaluate for evidence of arrhythmia and/or significant heart rate changes. Patient's presentation is most consistent with acute presentation with potential threat to life or bodily function. Patient is a 58 year old male who presents for almost 2 weeks of intermittent neurologic symptoms including dysarthria, left-sided facial droop, left upper and lower extremity weakness, and difficulty swallowing. DDx: CVA, TIAs, intracranial mass, intracranial hemorrhage, MS Plan: CBC, CMP, INR, PTT, ethanol head CT  Head CT significant for masses in the right frontal and left parietal areas of the brain and recommends an MRI with and without contrast.  MRI ordered  MRI concerning for metastasis with unknown primary.  I spoke to Dr. Penne Sharps in neurosurgery who has also consulted Dr. Dino Sable, with expertise on intracranial masses, who believes that this is likely metastasis  as well and request the patient be transferred to [facility] to the hospitalist service for further evaluation and management.   FINAL CLINICAL IMPRESSION(S) / ED DIAGNOSES   Final diagnoses:  Intracranial mass  Facial droop  Slurred speech  Weakness of left lower extremity   Rx / DC Orders   ED Discharge Orders     None      Note:  This document was prepared using Dragon voice recognition software and may include unintentional dictation errors.   Daris Aristizabal K, MD 05/03/24 1430

## 2024-05-03 NOTE — ED Notes (Signed)
 Patient transported to MRI

## 2024-05-03 NOTE — ED Notes (Signed)
EMTALA reviewed by Charge RN 

## 2024-05-03 NOTE — Progress Notes (Signed)
 I have reviewed the images and discussed his condition with the referring team.  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 Unless a primary is found and tissue is easier obtained, I think he may need a tissue diagnosis from one of the lesions.  I plan on seeing him tomorrow morning. I will communicate with the primary as well as the Oncology teams about the plan.

## 2024-05-03 NOTE — H&P (Signed)
 History and Physical    Lucas Yu FMW:[id number] DOB: August 27, 1965 DOA: 05/03/2024  PCP: [employer]  Patient coming from: Home  Chief Complaint: Multiple neurologic symptoms  HPI: Lucas Yu is a 58 y.o. male with medical history significant of hypertension, GERD, marijuana and tobacco abuse presented to [facility] ED due to multiple neurologic complaints including left-sided facial droop, dysarthria, dysphagia, and left-sided numbness/weakness.  Symptom onset was on 10/31.  CBC notable for platelet count 488k.  CMP notable for glucose 115 and total protein 8.3.  Ethanol level <15.  Brain MRI showing enhancing lesions with edema in the right frontal and left parietal lobes as well as a punctate lesion in the left cerebellum most consistent with metastatic disease.  Chest x-ray showing a right upper lobe pulmonary mass measuring 3.2 cm suspicious for primary lung neoplasm.  EDP discussed the case with neurosurgeon at [facility] who then discussed the case with neurosurgeon Dr. Janjua at [facility] who recommended transfer to [facility] to the patient can be evaluated by him.  Patient was given a dose of IV Decadron 10 mg in the ED.  Patient states on Halloween he had an episode of slurred speech that lasted about 15 minutes.  Since then, he has continued to have difficulty with his speech, difficulty swallowing, and also weakness of his left arm and leg along with pain.  He has been a cigarette smoker since he was a teenager and has been gradually trying to cut down.  At present, a pack of cigarettes lasts about 2 weeks.  Denies weight loss.  Denies cough, shortness of breath, chest pain, or abdominal pain.  Review of Systems:  Review of Systems  All other systems reviewed and are negative.   Past Medical History:  Diagnosis Date   Hypertension     No past surgical history on file.   reports that he has been smoking cigarettes. He has a 44 pack-year smoking history. He  has never used smokeless tobacco. He reports current alcohol use of about 7.0 standard drinks of alcohol per week. He reports current drug use. Drug: Marijuana.  Allergies  Allergen Reactions   Penicillins     Has patient had a PCN reaction causing immediate rash, facial/tongue/throat swelling, SOB or lightheadedness with hypotension: No Has patient had a PCN reaction causing severe rash involving mucus membranes or skin necrosis: No Has patient had a PCN reaction that required hospitalization: No Has patient had a PCN reaction occurring within the last 10 years: No If all of the above answers are NO, then may proceed with Cephalosporin use.     No family history on file.  Prior to Admission medications   Medication Sig Start Date End Date Taking? Authorizing Provider  cetirizine (ZYRTEC) 10 MG tablet Take 10 mg by mouth daily as needed. 03/08/24  Yes [provider]  cyclobenzaprine  (FLEXERIL ) 10 MG tablet Take 10 mg by mouth 3 (three) times daily as needed for muscle spasms.   Yes [provider]  gabapentin  (NEURONTIN ) 400 MG capsule Take 1 capsule (400 mg total) by mouth at bedtime. 05/15/20  Yes Iloabachie, Chioma E, NP  pantoprazole (PROTONIX) 40 MG tablet Take 1 tablet (40 mg total) by mouth daily. 04/21/24 04/21/25 Yes Paduchowski, Franky, MD  pregabalin (LYRICA) 25 MG capsule Take 25 mg by mouth every 8 (eight) hours. 03/01/24  Yes [provider]  sucralfate (CARAFATE) 1 g tablet Take 1 tablet (1 g total) by mouth 4 (four)  times daily for 15 days. 04/21/24 05/06/24 Yes Dorothyann Drivers, MD  tadalafil (CIALIS) 20 MG tablet Take 20 mg by mouth once as needed for erectile dysfunction. 03/28/24  Yes [provider]    Physical Exam: Vitals:   05/03/24 1900  BP: (!) 138/98  Pulse: 76  Resp: 18  Temp: 97.7 F (36.5 C)  TempSrc: Oral  SpO2: 93%    Physical Exam Vitals reviewed.  Constitutional:      General: He is not in acute  distress. HENT:     Head: Normocephalic and atraumatic.  Eyes:     Extraocular Movements: Extraocular movements intact.  Cardiovascular:     Rate and Rhythm: Normal rate and regular rhythm.     Heart sounds: Normal heart sounds.  Pulmonary:     Effort: Pulmonary effort is normal. No respiratory distress.     Breath sounds: Normal breath sounds.  Abdominal:     General: Bowel sounds are normal.     Palpations: Abdomen is soft.     Tenderness: There is no abdominal tenderness. There is no guarding.  Musculoskeletal:     Cervical back: Normal range of motion.     Right lower leg: No edema.     Left lower leg: No edema.  Skin:    General: Skin is warm and dry.  Neurological:     Mental Status: He is alert and oriented to person, place, and time.     Comments: Facial droop Dysarthria Left upper and lower extremity weakness     Labs on Admission: I have personally reviewed following labs and imaging studies  CBC: Recent Labs  Lab 05/03/24 1153  WBC 7.2  NEUTROABS 4.0  HGB 13.2  HCT 40.4  MCV 92.9  PLT 488*   Basic Metabolic Panel: Recent Labs  Lab 05/03/24 1153  NA 138  K 3.9  CL 100  CO2 26  GLUCOSE 115*  BUN 7  CREATININE 0.87  CALCIUM 9.9   GFR: Estimated Creatinine Clearance: 84.4 mL/min (by C-G formula based on SCr of 0.87 mg/dL). Liver Function Tests: Recent Labs  Lab 05/03/24 1153  AST 21  ALT <5  ALKPHOS 107  BILITOT 0.4  PROT 8.3*  ALBUMIN 4.1   No results for input(s): LIPASE, AMYLASE in the last 168 hours. No results for input(s): AMMONIA in the last 168 hours. Coagulation Profile: Recent Labs  Lab 05/03/24 1153  INR 1.1   Cardiac Enzymes: No results for input(s): CKTOTAL, CKMB, CKMBINDEX, TROPONINI in the last 168 hours. BNP (last 3 results) No results for input(s): PROBNP in the last 8760 hours. HbA1C: No results for input(s): HGBA1C in the last 72 hours. CBG: No results for input(s): GLUCAP in the last 168  hours. Lipid Profile: No results for input(s): CHOL, HDL, LDLCALC, TRIG, CHOLHDL, LDLDIRECT in the last 72 hours. Thyroid Function Tests: No results for input(s): TSH, T4TOTAL, FREET4, T3FREE, THYROIDAB in the last 72 hours. Anemia Panel: No results for input(s): VITAMINB12, FOLATE, FERRITIN, TIBC, IRON, RETICCTPCT in the last 72 hours. Urine analysis:    Component Value Date/Time   COLORURINE YELLOW (A) 04/21/2024 0927   APPEARANCEUR CLEAR (A) 04/21/2024 0927   APPEARANCEUR Clear 02/01/2020 1037   LABSPEC >1.046 (H) 04/21/2024 0927   LABSPEC 1.018 01/16/2014 0717   PHURINE 6.0 04/21/2024 0927   GLUCOSEU NEGATIVE 04/21/2024 0927   GLUCOSEU Negative 01/16/2014 0717   HGBUR NEGATIVE 04/21/2024 0927   BILIRUBINUR NEGATIVE 04/21/2024 0927   BILIRUBINUR Negative 02/01/2020 1037   BILIRUBINUR  Negative 01/16/2014 0717   KETONESUR NEGATIVE 04/21/2024 0927   PROTEINUR NEGATIVE 04/21/2024 0927   NITRITE NEGATIVE 04/21/2024 0927   LEUKOCYTESUR NEGATIVE 04/21/2024 0927   LEUKOCYTESUR Negative 01/16/2014 0717    Radiological Exams on Admission: MR Brain W and Wo Contrast Result Date: 05/03/2024 EXAM: MRI BRAIN WITH AND WITHOUT CONTRAST 05/03/2024 02:05:09 PM TECHNIQUE: Multiplanar multisequence MRI of the head/brain was performed with and without the administration of intravenous contrast. 7 mL (gadobutrol (GADAVIST) 1 MMOL/ML injection 7 mL GADOBUTROL 1 MMOL/ML IV SOLN). COMPARISON: CT head 05/03/2024. CLINICAL HISTORY: Brain/CNS neoplasm, staging; Neuro deficit, acute, stroke suspected. Slurred speech, facial droop, dysphagia, and left sided numbness/weakness. FINDINGS: LIMITATIONS: The examination is intermittently up to moderately motion degraded. BRAIN AND VENTRICLES: Peripherally enhancing lesions measure 1.7 x 1.3 cm in the posterior right frontal lobe and 1.0 x 0.8 x 1.0 cm in the left parietal lobe. There is moderate associated vasogenic edema, greater in  the posterior right frontal lobe, with associated sulcal effacement and slight mass effect on the right greater than left lateral ventricles. There is no midline shift. A punctate focus of susceptibility associated with the right frontal lesion may reflect minimal blood products or mineralization. There is also a punctate 1.5-2 mm enhancing lesion in the superior left cerebellar hemisphere (series 18 image 48 and series 20 image 15) without edema. There is no restricted diffusion associated with these lesions. No acute infarct, hydrocephalus, or extra axial fluid collection is evident. Cerebral volume is normal. Major intracranial vascular flow voids are preserved. ORBITS: No acute abnormality. SINUSES: No acute abnormality. BONES AND SOFT TISSUES: Normal bone marrow signal and enhancement. Small right frontal scalp lipoma. IMPRESSION: 1. Enhancing lesions with edema in the right frontal and left parietal lobes as well as a punctate lesion in the left cerebellum, most consistent with metastatic disease. Electronically signed by: Dasie Hamburg MD 05/03/2024 03:24 PM EST RP Workstation: [id number]   DG Chest Port 1 View Result Date: 05/03/2024 EXAM: 1 VIEW(S) XRAY OF THE CHEST 05/03/2024 02:15:00 PM COMPARISON: Chest CT 04/04/2020. CLINICAL HISTORY: Brain lesions, slurred speech, weakness, and dysphasia. Possible metastases in brain. FINDINGS: LUNGS AND PLEURA: 3.2 cm right upper lobe mass. No pulmonary edema. No pleural effusion. No pneumothorax. HEART AND MEDIASTINUM: No acute abnormality of the cardiac and mediastinal silhouettes. BONES AND SOFT TISSUES: No acute osseous abnormality. IMPRESSION: 1. Right upper lobe pulmonary mass measuring 3.2 cm, suspicious for primary lung neoplasm; recommend dedicated chest CT for further characterization and staging evaluation, and oncologic referral. Electronically signed by: Maude Stammer MD 05/03/2024 02:29 PM EST RP Workstation: [id number]   CT HEAD WO  CONTRAST Result Date: 05/03/2024 EXAM: CT HEAD WITHOUT CONTRAST 05/03/2024 12:06:07 PM TECHNIQUE: CT of the head was performed without the administration of intravenous contrast. Automated exposure control, iterative reconstruction, and/or weight based adjustment of the mA/kV was utilized to reduce the radiation dose to as low as reasonably achievable. COMPARISON: None available. CLINICAL HISTORY: Neuro deficit, acute, stroke suspected. FINDINGS: BRAIN AND VENTRICLES: Hyperdense masses in the posterior right frontal lobe and left parietal lobe measure 1.3 cm and 1.0 cm, respectively. Associated moderate vasogenic edema is greater in the right frontal lobe. There is mild regional mass effect without significant midline shift. Elsewhere, no acute infarct, intracranial hemorrhage, hydrocephalus, or extra-axial fluid collection is identified. Cerebral volume is normal. ORBITS: No acute abnormality. SINUSES: No acute abnormality. SOFT TISSUES AND SKULL: No acute soft tissue abnormality. No skull fracture. IMPRESSION: 1. Right frontal and left parietal masses with  moderate vasogenic edema. A brain MRI without and with IV contrast is recommended for further evaluation. Electronically signed by: Dasie Hamburg MD 05/03/2024 12:21 PM EST RP Workstation: [id number]    EKG: Independently reviewed.  Sinus rhythm, no acute changes.  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  Brain MRI showing enhancing lesions with edema in the right frontal and left parietal lobes as well as a punctate lesion in the left cerebellum most consistent with metastatic disease.  Chest x-ray showing a right upper lobe pulmonary mass measuring 3.2 cm suspicious for primary lung neoplasm.  Patient is reporting longstanding history of cigarette smoking.  CT chest with contrast  has been ordered for further characterization of lung mass and staging evaluation.  Patient had recent CT abdomen pelvis on October 30th which was showing an indeterminant right adrenal mass and a focal area of hypoenhancement in the left hepatic lobe.  MRI of abdomen with and without contrast has been ordered for further evaluation.  Patient will need biopsy of lung mass.  He will need evaluation by pulmonology and oncology.  Regarding brain lesions with edema concerning for metastatic disease, patient was given a dose of IV Decadron 10 mg in the ED.  Continue IV Decadron 4 mg every 6 hours.  Continue neurochecks every 4 hours, fall precautions.  Neurosurgery consulted.  Regarding dysphagia, patient was able to eat a [employer] meal on arrival to [facility] without any difficulty.  SLP eval in the morning.  Hypertension He is not on antihypertensives.  SBP currently in 130s.  Cigarette smoker Nicotine patch and continue to counsel him to completely stop smoking especially given findings above.  DVT prophylaxis: SCDs Code Status: Full Code (discussed with the patient) Level of care: Progressive Care Unit Admission status: It is my clinical opinion that admission to INPATIENT is reasonable and necessary because of the expectation that this patient will require hospital care that crosses at least 2 midnights to treat this condition based on the medical complexity of the problems presented.  Given the aforementioned information, the predictability of an adverse outcome is felt to be significant.  Editha Ram MD [facility]  If 7PM-7AM, please contact night-coverage [email or web address]  05/03/2024, 7:25 PM

## 2024-05-03 NOTE — ED Notes (Signed)
 CALLED CARELINK SPOKE WITH KIM

## 2024-05-03 NOTE — Progress Notes (Deleted)
   05/03/24 12:35 PM   Lucas Yu 1965-11-05 [id number]   HPI: 58 y.o. male here for initial evaluation of ED  Symptoms began: {bglistEDtime:33346} Primary issue: {bglistEDtypes:33347} Libido: {bglistLibido:33348} Relationship w/ partner: *** Rigidity: {bglistEDrigidity:33349} SHIM: ***  Prior therapies:  ***  Smokes marijuana  Tobacco Use: High Risk (05/03/2024)   Patient History    Smoking Tobacco Use: Every Day    Smokeless Tobacco Use: Never    Passive Exposure: Not on file        PMH: Past Medical History:  Diagnosis Date   Hypertension     Surgical History: No past surgical history on file.  Family History: No family history on file.  Social History:  reports that he has been smoking cigarettes. He has a 44 pack-year smoking history. He has never used smokeless tobacco. He reports current alcohol use of about 7.0 standard drinks of alcohol per week. He reports current drug use. Drug: Marijuana.      Physical Exam: There were no vitals taken for this visit.  Constitutional:  Alert and oriented, No acute distress. Cardiovascular: No clubbing, cyanosis, or edema. Respiratory: Normal respiratory effort, no increased work of breathing. GI: Nondistended Skin: No rashes, bruises or suspicious lesions. Neurologic: Grossly intact, no focal deficits, moving all 4 extremities. Psychiatric: Normal mood and affect.  Laboratory Data: N/A   Pertinent Imaging: I have personally viewed and interpreted the ***.    Assessment & Plan:    There are no diagnoses linked to this encounter.     [name], MD 05/03/2024  [facility] [address] [place], [place] [address] [phone]

## 2024-05-03 NOTE — Progress Notes (Signed)
 Pt out to CT scan

## 2024-05-04 ENCOUNTER — Inpatient Hospital Stay (HOSPITAL_COMMUNITY)

## 2024-05-04 ENCOUNTER — Encounter (HOSPITAL_COMMUNITY): Payer: Self-pay | Admitting: Anesthesiology

## 2024-05-04 ENCOUNTER — Encounter (HOSPITAL_COMMUNITY): Payer: Self-pay | Admitting: Internal Medicine

## 2024-05-04 ENCOUNTER — Other Ambulatory Visit: Payer: Self-pay | Admitting: Radiation Therapy

## 2024-05-04 DIAGNOSIS — R918 Other nonspecific abnormal finding of lung field: Secondary | ICD-10-CM | POA: Diagnosis not present

## 2024-05-04 DIAGNOSIS — C3490 Malignant neoplasm of unspecified part of unspecified bronchus or lung: Secondary | ICD-10-CM

## 2024-05-04 DIAGNOSIS — C7931 Secondary malignant neoplasm of brain: Secondary | ICD-10-CM

## 2024-05-04 DIAGNOSIS — F17210 Nicotine dependence, cigarettes, uncomplicated: Secondary | ICD-10-CM

## 2024-05-04 LAB — TYPE AND SCREEN
ABO/RH(D): B POS
Antibody Screen: NEGATIVE

## 2024-05-04 LAB — GLUCOSE, CAPILLARY
Glucose-Capillary: 130 mg/dL — ABNORMAL HIGH (ref 70–99)
Glucose-Capillary: 133 mg/dL — ABNORMAL HIGH (ref 70–99)
Glucose-Capillary: 141 mg/dL — ABNORMAL HIGH (ref 70–99)

## 2024-05-04 LAB — HIV ANTIBODY (ROUTINE TESTING W REFLEX): HIV Screen 4th Generation wRfx: NONREACTIVE

## 2024-05-04 MED ORDER — GADOBUTROL 1 MMOL/ML IV SOLN
7.0000 mL | Freq: Once | INTRAVENOUS | Status: AC | PRN
  Administered 2024-05-04: 7 mL via INTRAVENOUS

## 2024-05-04 MED ORDER — POLYETHYLENE GLYCOL 3350 17 G PO PACK
17.0000 g | PACK | Freq: Two times a day (BID) | ORAL | Status: DC
  Administered 2024-05-04 – 2024-05-05 (×3): 17 g via ORAL
  Filled 2024-05-04 (×3): qty 1

## 2024-05-04 MED ORDER — PANTOPRAZOLE SODIUM 40 MG PO TBEC
40.0000 mg | DELAYED_RELEASE_TABLET | Freq: Every day | ORAL | Status: DC
  Administered 2024-05-04 – 2024-05-05 (×2): 40 mg via ORAL
  Filled 2024-05-04 (×2): qty 1

## 2024-05-04 MED ORDER — SUCRALFATE 1 G PO TABS
1.0000 g | ORAL_TABLET | Freq: Four times a day (QID) | ORAL | Status: DC
  Administered 2024-05-04 – 2024-05-05 (×5): 1 g via ORAL
  Filled 2024-05-04 (×8): qty 1

## 2024-05-04 MED ORDER — MORPHINE SULFATE (PF) 2 MG/ML IV SOLN
1.0000 mg | INTRAVENOUS | Status: DC | PRN
  Administered 2024-05-04 (×3): 1 mg via INTRAVENOUS
  Filled 2024-05-04 (×3): qty 1

## 2024-05-04 MED ORDER — CYCLOBENZAPRINE HCL 10 MG PO TABS
10.0000 mg | ORAL_TABLET | Freq: Three times a day (TID) | ORAL | Status: DC | PRN
Start: 2024-05-04 — End: 2024-05-05
  Administered 2024-05-04: 10 mg via ORAL
  Filled 2024-05-04: qty 1

## 2024-05-04 MED ORDER — PREGABALIN 25 MG PO CAPS
25.0000 mg | ORAL_CAPSULE | Freq: Three times a day (TID) | ORAL | Status: DC
  Administered 2024-05-04 – 2024-05-05 (×3): 25 mg via ORAL
  Filled 2024-05-04 (×3): qty 1

## 2024-05-04 MED ORDER — OXYCODONE HCL 5 MG PO TABS: 5.0000 mg | ORAL_TABLET | Freq: Four times a day (QID) | ORAL | Status: DC | PRN

## 2024-05-04 NOTE — Evaluation (Signed)
 Physical Therapy Evaluation Patient Details Name: Lucas Yu MRN: [id number] DOB: Feb 26, 1966 Today's Date: 05/04/2024  History of Present Illness  Pt is a 58 y.o. male who presented to [facility] with strokelike sx including L facial droop, dysarthria, dysphagia, and L numbness and weakness on 10/31. Brain MRI revealed metastases in R frontal lobe, L parietal lobe, and L cerebellum. Other imaging showed R pulmonary mass and adrenal masses. Transferred to [facility] for further evaluation by neurosurgery. Med hx significant for HTN, GERD, marijuana and tobacco use.  Clinical Impression  PTA, pt requiring assist for most ADLs, lives with significant other in level entry apartment, and has recently been ambulating with rollator in home. He has a PC aide that comes 2 hrs/day, 5 days/week and typically helps with grooming, bathing, and other ADLs as needed. Currently, pt modI with bed mobility and CGA for transfers and gait. Able to ambulate 200' with RW and required one standing rest break, with VC needed for RW proximity throughout. Overall demonstrated steady  but slow gait, and did not note strength or cognitive impairments. Recommend HH PT upon d/c to continue improving functional mobility with RW in home, as well as improving independence with ADLs.       If plan is discharge home, recommend the following: A little help with walking and/or transfers;A little help with bathing/dressing/bathroom;Assistance with cooking/housework;Assist for transportation   Can travel by private vehicle        Equipment Recommendations Other (comment);Rolling walker (2 wheels) (Tub bench)  Recommendations for Other Services       Functional Status Assessment Patient has had a recent decline in their functional status and demonstrates the ability to make significant improvements in function in a reasonable and predictable amount of time.     Precautions / Restrictions Precautions Precautions: Fall Recall of  Precautions/Restrictions: Intact Restrictions Weight Bearing Restrictions Per Provider Order: No      Mobility  Bed Mobility Overal bed mobility: Modified Independent             General bed mobility comments: Utilized bed rails to assist supine>sit. Otherwise, appeared safe and required no physical assist to maintain balance.    Transfers Overall transfer level: Needs assistance Equipment used: Rolling walker (2 wheels) Transfers: Sit to/from Stand Sit to Stand: Contact guard assist           General transfer comment: Pt slightly imulsive in standing before therapist instruction. No assist needed to stand, no LOB noted.    Ambulation/Gait Ambulation/Gait assistance: Contact guard assist Gait Distance (Feet): 200 Feet (With one standing rest break) Assistive device: Rolling walker (2 wheels) Gait Pattern/deviations: Trunk flexed, Step-through pattern Gait velocity: Dec Gait velocity interpretation: <1.8 ft/sec, indicate of risk for recurrent falls   General Gait Details: Required VC 2x for RW proximity. Pt reported feeling slight weakness in L quad and hamstring, which dec with standing rest break. Able to find way back to room without verbal assist.  Stairs            Wheelchair Mobility     Tilt Bed    Modified Rankin (Stroke Patients Only)       Balance Overall balance assessment: Needs assistance Sitting-balance support: Feet supported, No upper extremity supported Sitting balance-Leahy Scale: Fair     Standing balance support: Reliant on assistive device for balance, During functional activity, Bilateral upper extremity supported Standing balance-Leahy Scale: Poor  Pertinent Vitals/Pain Pain Assessment Pain Assessment: Faces Faces Pain Scale: Hurts a little bit Pain Location: Back Pain Descriptors / Indicators: Discomfort Pain Intervention(s): Limited activity within patient's tolerance, Monitored  during session, Premedicated before session    Home Living Family/patient expects to be discharged to:: Private residence Living Arrangements: Spouse/significant other Available Help at Discharge: Family;Available 24 hours/day Type of Home: Apartment Home Access: Level entry       Home Layout: One level Home Equipment: Rollator (4 wheels);Cane - single point      Prior Function Prior Level of Function : Needs assist             Mobility Comments: Denies falls in last 6 months. States he has been mostly using rollator for mobility the last few weeks. ADLs Comments: Aide comes 2 hrs/day, 5 days/week to help with bathing, grooming, and other ADL needs as needed.     Extremity/Trunk Assessment   Upper Extremity Assessment Upper Extremity Assessment: Overall WFL for tasks assessed (Grip strength WNL bilaterally)    Lower Extremity Assessment Lower Extremity Assessment: RLE deficits/detail;LLE deficits/detail RLE Deficits / Details: MMT: hip flexion, knee flexion/extension, ankle PF/DF 5/5 grossly RLE Sensation: WNL LLE Deficits / Details: MMT: hip flexion, knee flexion/extension, ankle PF/DF 5/5 grossly LLE Sensation: WNL    Cervical / Trunk Assessment Cervical / Trunk Assessment: Normal  Communication   Communication Communication: No apparent difficulties    Cognition Arousal: Alert Behavior During Therapy: WFL for tasks assessed/performed   PT - Cognitive impairments: No apparent impairments                       PT - Cognition Comments: A&Ox4. Following commands: Intact       Cueing Cueing Techniques: Verbal cues, Gestural cues     General Comments General comments (skin integrity, edema, etc.): Demonstrated motivation to improve functional mobility and was very pleasant to work with. Did not note cognitive impairments.    Exercises     Assessment/Plan    PT Assessment Patient needs continued PT services  PT Problem List Decreased range of  motion;Decreased activity tolerance;Decreased balance;Decreased mobility;Decreased knowledge of use of DME;Decreased safety awareness       PT Treatment Interventions DME instruction;Gait training;Stair training;Functional mobility training;Therapeutic exercise;Therapeutic activities;Balance training;Neuromuscular re-education;Patient/family education;Manual techniques;Modalities    PT Goals (Current goals can be found in the Care Plan section)  Acute Rehab PT Goals Patient Stated Goal: to improve mobility PT Goal Formulation: With patient Time For Goal Achievement: 05/18/24 Potential to Achieve Goals: Good    Frequency Min 2X/week     Co-evaluation               AM-PAC PT 6 Clicks Mobility  Outcome Measure Help needed turning from your back to your side while in a flat bed without using bedrails?: None Help needed moving from lying on your back to sitting on the side of a flat bed without using bedrails?: None Help needed moving to and from a bed to a chair (including a wheelchair)?: A Little Help needed standing up from a chair using your arms (e.g., wheelchair or bedside chair)?: A Little Help needed to walk in hospital room?: A Little Help needed climbing 3-5 steps with a railing? : A Little 6 Click Score: 20    End of Session Equipment Utilized During Treatment: Gait belt Activity Tolerance: Patient tolerated treatment well Patient left: in bed;with nursing/sitter in room;with call bell/phone within reach Nurse Communication: Mobility status PT Visit  Diagnosis: Other abnormalities of gait and mobility (R26.89)    Time: 8540-8473 PT Time Calculation (min) (ACUTE ONLY): 27 min   Charges:   PT Evaluation $PT Eval Low Complexity: 1 Low PT Treatments $Therapeutic Activity: 8-22 mins PT General Charges $$ ACUTE PT VISIT: 1 Visit         Yanelis Osika, SPT   Penny Arrambide 05/04/2024, 3:58 PM

## 2024-05-04 NOTE — Consult Note (Signed)
 NAME:  Lucas Yu, MRN:  [id number], DOB:  09-Mar-1966, LOS: 1 ADMISSION DATE:  05/03/2024, CONSULTATION DATE:  05/04/24 REFERRING MD:  Judeth, CHIEF COMPLAINT:  weakness   History of Present Illness:  58 year old man w/ hx of HTN, tobacco abuse presenting with trouble speaking, swallowing and some left sided weakness for last couple weeks.    Workup has revealed widely metastatic cancer with easiest avenue for tissue being lung.  Started on decadron, medOnc, RadOnc, NSGY, PCCM consulted.  Patient currently feels okay, no hemoptysis, wants to know when he can go home.  Pertinent  Medical History  HTN Tobacco abuse  Significant Hospital Events: Including procedures, antibiotic start and stop dates in addition to other pertinent events   11/12 admit  Interim History / Subjective:  consult  Objective    Blood pressure (!) 132/95, pulse 73, temperature 97.9 F (36.6 C), temperature source Oral, resp. rate 18, SpO2 99%.        Intake/Output Summary (Last 24 hours) at 05/04/2024 9072 Last data filed at 05/03/2024 2230 Gross per 24 hour  Intake --  Output 125 ml  Net -125 ml   There were no vitals filed for this visit.  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  - NPO MN - EBUS tomorrow am - HemeOnc, RadOnc, NSGY already aware - Steroids as ordered - Patient wants to go home after biopsy, defer to primary and PT eval  Labs   CBC: Recent Labs  Lab 05/03/24 1153  WBC 7.2  NEUTROABS 4.0  HGB 13.2  HCT 40.4  MCV 92.9  PLT 488*    Basic  Metabolic Panel: Recent Labs  Lab 05/03/24 1153  NA 138  K 3.9  CL 100  CO2 26  GLUCOSE 115*  BUN 7  CREATININE 0.87  CALCIUM 9.9   GFR: Estimated Creatinine Clearance: 84.4 mL/min (by C-G formula based on SCr of 0.87 mg/dL). Recent Labs  Lab 05/03/24 1153  WBC 7.2    Liver Function Tests: Recent Labs  Lab 05/03/24 1153  AST 21  ALT <5  ALKPHOS 107  BILITOT 0.4  PROT 8.3*  ALBUMIN 4.1   No results for input(s): LIPASE, AMYLASE in the last 168 hours. No results for input(s): AMMONIA in the last 168 hours.  ABG No results found for: PHART, PCO2ART, PO2ART, HCO3, TCO2, ACIDBASEDEF, O2SAT   Coagulation Profile: Recent Labs  Lab 05/03/24 1153  INR 1.1    Cardiac Enzymes: No results for input(s): CKTOTAL, CKMB, CKMBINDEX, TROPONINI in the last 168 hours.  HbA1C: Hgb A1c MFr Bld  Date/Time Value Ref Range Status  11/30/2019 10:58 AM 5.5 4.8 - 5.6 % Final    Comment:             Prediabetes: 5.7 - 6.4          Diabetes: >6.4          Glycemic control for adults with diabetes: <7.0     CBG: Recent Labs  Lab 05/03/24 2145 05/04/24 0340  GLUCAP 177* 130*    Review of Systems:    Positive Symptoms in bold:  Constitutional  fevers, chills, weight loss, fatigue, anorexia, malaise  Eyes decreased vision, double vision, eye irritation  Ears, Nose, Mouth, Throat sore throat, trouble swallowing, sinus congestion  Cardiovascular chest pain, paroxysmal nocturnal dyspnea, lower ext edema, palpitations   Respiratory SOB, cough, DOE, hemoptysis, wheezing  Gastrointestinal nausea, vomiting, diarrhea  Genitourinary burning with urination, trouble urinating  Musculoskeletal joint aches, joint swelling, back pain  Integumentary  rashes, skin lesions  Neurological focal weakness, focal numbness, trouble speaking, headaches  Psychiatric depression, anxiety, confusion  Endocrine polyuria, polydipsia, cold intolerance, heat intolerance   Hematologic abnormal bruising, abnormal bleeding, unexplained nose bleeds  Allergic/Immunologic recurrent infections, hives, swollen lymph nodes     Past Medical History:  He,  has a past medical history of Hypertension.   Surgical History:  No past surgical history on file.   Social History:   reports that he has been smoking cigarettes. He has a 44 pack-year smoking history. He has never used smokeless tobacco. He reports current alcohol use of about 7.0 standard drinks of alcohol per week. He reports current drug use. Drug: Marijuana.   Family History:  His family history is not on file.   Allergies Allergies  Allergen Reactions   Penicillins     Has patient had a PCN reaction causing immediate rash, facial/tongue/throat swelling, SOB or lightheadedness with hypotension: No Has patient had a PCN reaction causing severe rash involving mucus membranes or skin necrosis: No Has patient had a PCN reaction that required hospitalization: No Has patient had a PCN reaction occurring within the last 10 years: No If all of the above answers are NO, then may proceed with Cephalosporin use.      Home Medications  Prior to Admission medications   Medication Sig Start Date End Date Taking? Authorizing Provider  cetirizine (ZYRTEC) 10 MG tablet Take 10 mg by mouth daily as needed for allergies. 03/08/24  Yes [provider]  cyclobenzaprine  (FLEXERIL ) 10 MG tablet Take 10 mg by mouth 3 (three) times daily as needed for muscle spasms.   Yes [provider]  gabapentin  (NEURONTIN ) 400 MG capsule Take 1 capsule (400 mg total) by mouth at bedtime. Patient taking differently: Take 400 mg by mouth daily as needed (for pain). 05/15/20  Yes Iloabachie, Chioma E, NP  pantoprazole (PROTONIX) 40 MG tablet Take 1 tablet (40 mg total) by mouth daily. 04/21/24 04/21/25 Yes Paduchowski, Franky, MD  pregabalin (LYRICA) 25 MG capsule Take 25 mg by mouth every 8 (eight) hours. 03/01/24  Yes  [provider]  sucralfate (CARAFATE) 1 g tablet Take 1 tablet (1 g total) by mouth 4 (four) times daily for 15 days. 04/21/24 05/06/24 Yes Dorothyann Franky, MD  tadalafil (CIALIS) 20 MG tablet Take 20 mg by mouth once as needed for erectile dysfunction. 03/28/24  Yes [provider]     Critical care time: N/A

## 2024-05-04 NOTE — Evaluation (Signed)
 Clinical/Bedside Swallow Evaluation Patient Details  Name: Lucas Yu MRN: [id number] Date of Birth: 09-Oct-1965  Today's Date: 05/04/2024 Time: SLP Start Time (ACUTE ONLY): 1023 SLP Stop Time (ACUTE ONLY): 1030 SLP Time Calculation (min) (ACUTE ONLY): 7 min  Past Medical History:  Past Medical History:  Diagnosis Date   Hypertension    Past Surgical History: No past surgical history on file. HPI:  Lucas Yu is a 58 yo M who presented to [facility] with strokelike symptoms including left-sided facial droop, dysarthria, dysphagia, left-sided numbness and weakness with symptom onset 10/31.  Further workup revealed MRI brain with enhancing lesions with edema in the right frontal and left parietal lobe concerning for metastatic disease.  CT chest showed right upper lobe pulmonary mass. Chest CT 11/12 with findings concerning for malignancy and emphysema, but not pna/aspiration. Pt with medical history significant for HTN, GERD, marijuana and tobacco use.    Assessment / Plan / Recommendation  Clinical Impression  Pt presents with functional swallowing as assessed clinically.  Pt tolerated all consistencies trialed without any clinical s/s of aspiration and exhibited good oral clearance of solid textures.  Pt reports improvement in subjective difficulty swallowing since admission.  Pt with harsh vocal quality which is a change from baseline, but he reports improvement in vocal quality as well.  If pt experiences decline in function or changes to vocal quality please alert ST (via [facility] SLP group in secure chat or by phone at [phone]) for reassessment.    Of note, pt with redness to mucosa and 3 white circular lesions in oropharynx just past faucial arch on R.  Showed to nursing in room.  Pt denies sore throat.  Recommend regular texture diet with thin liquid.   SLP Visit Diagnosis: Dysphagia, unspecified (R13.10)    Aspiration Risk  No limitations    Diet Recommendation Regular;Thin liquid     Medication Administration: Whole meds with liquid Supervision: Patient able to self feed Compensations: Slow rate;Small sips/bites Postural Changes: Seated upright at 90 degrees    Other  Recommendations Oral Care Recommendations: Oral care BID     Assistance Recommended at Discharge  N/A  Functional Status Assessment Patient has had a recent decline in their functional status and demonstrates the ability to make significant improvements in function in a reasonable and predictable amount of time.  Frequency and Duration min 2x/week  2 weeks       Prognosis Prognosis for improved oropharyngeal function: Good      Swallow Study   General Date of Onset: 05/03/24 HPI: Lucas Yu is a 58 yo M who presented to [facility] with strokelike symptoms including left-sided facial droop, dysarthria, dysphagia, left-sided numbness and weakness with symptom onset 10/31.  Further workup revealed MRI brain with enhancing lesions with edema in the right frontal and left parietal lobe concerning for metastatic disease.  CT chest showed right upper lobe pulmonary mass. Chest CT 11/12 with findings concerning for malignancy and emphysema, but not pna/aspiration. Pt with medical history significant for HTN, GERD, marijuana and tobacco use. Type of Study: Bedside Swallow Evaluation Previous Swallow Assessment: None Diet Prior to this Study: NPO Temperature Spikes Noted: No History of Recent Intubation: No Behavior/Cognition: Alert;Cooperative;Pleasant mood Oral Cavity Assessment: Within Functional Limits Oral Care Completed by SLP: No Oral Cavity - Dentition: Adequate natural dentition Vision: Functional for self-feeding Self-Feeding Abilities: Able to feed self Patient Positioning: Upright in chair Baseline Vocal Quality: Hoarse (Harsh; reports improvement in vocal quality from admission) Volitional Cough: Strong Volitional  Swallow: Able to elicit    Oral/Motor/Sensory Function Overall Oral  Motor/Sensory Function: Mild impairment Facial ROM: Reduced left Facial Symmetry: Abnormal symmetry left Lingual ROM: Within Functional Limits Lingual Symmetry: Within Functional Limits;Abnormal symmetry left Lingual Strength: Within Functional Limits Velum: Within Functional Limits Mandible: Within Functional Limits   Ice Chips Ice chips: Not tested   Thin Liquid Thin Liquid: Within functional limits Presentation: Straw    Nectar Thick Nectar Thick Liquid: Not tested   Honey Thick Honey Thick Liquid: Within functional limits   Puree Puree: Within functional limits Presentation: Spoon   Solid     Solid: Within functional limits Presentation: Self Fed      Anette FORBES Grippe, MA, CCC-SLP Acute Rehabilitation Services Office: [phone] 05/04/2024,10:42 AM

## 2024-05-04 NOTE — Progress Notes (Signed)
 PROGRESS NOTE   Lucas Yu  FMW:[id number]    DOB: Sep 03, 1965    DOA: 05/03/2024  PCP: [facility]   I have briefly reviewed patients previous medical records in [facility].   Brief Hospital Course:  58 year old male with medical history significant for HTN, GERD, marijuana and tobacco use, initially presented to [facility] with strokelike symptoms including left-sided facial droop, dysarthria, dysphagia, left-sided numbness and weakness with symptom onset 10/31.  Further workup revealed MRI brain with enhancing lesions with edema in the right frontal and left parietal lobe concerning for metastatic disease.  CT chest showed right upper lobe pulmonary mass.  EDP at [facility] discussed case with [facility] neurosurgery and was transferred to [facility] for further evaluation by them.  PCCM consulted 11/12 and plan on EBUS with biopsy on 11/13.  Radiation oncology on board.  Will consult medical oncology pending pathology results.   Assessment & Plan:   New onset strokelike symptoms secondary to Suspected malignant metastatic brain lesions RUL lung mass concerning for primary bronchogenic carcinoma Necrotic right hilar and precarinal lymphadenopathy Bilateral adrenal mets History as noted above. CT head without contrast 11/11: Right frontal and left parietal masses with moderate vasogenic edema. CT chest with contrast 11/11: 3.1 x 3.5 x 3.4 cm RUL pulmonary mass tethered to the peripheral pleura.  Imaging features compatible with primary bronchogenic neoplasm.  Necrotic right hilar and precarinal lymphadenopathy consistent with metastatic disease.  Bilateral adrenal nodules, right larger than left, consistent with metastatic disease.  MRI brain: Enhancing lesions with edema in the right frontal and left parietal lobes as well as a punctate lesion in the left cerebellum, most consistent with metastatic disease. MRI abdomen with and without contrast pending. Extensively communicated with  neurosurgery, radiation oncology and PCCM via secure chat. Per neurosurgery, if pathology can be obtained by alternate route, no plans for craniotomy. PCCM consulted, discussed with Dr. Toribio Sharps, plan on EBUS and biopsy 11/13. Continue Decadron 4 mg IV Q6 hourly.  Strokelike symptoms have improved but not resolved. Given reported dysphagia, awaiting SLP input prior to initiating diet. PT and OT evaluation.  Tobacco and marijuana use disorder Emphysema on CT chest Cessation counseled. Continue nicotine patch  Essential hypertension Reasonable in patient control.  Not on antihypertensives PTA.  There is no height or weight on file to calculate BMI.   DVT prophylaxis: SCDs Start: 05/03/24 2036     Code Status: Full Code:  Family Communication: Fianc at bedside Disposition:  Status is: Inpatient Remains inpatient appropriate because: EBUS planned for tomorrow.  IV steroids.     Consultants:   Neurosurgery PCCM  Procedures:     Subjective:  Patient reports that he feels much better.  Specifically denied pain.  No headache or visual symptoms.  Dysarthria has improved but not resolved.  Per fianc, facial asymmetry has improved but not resolved.  Swallowing improved and wants diet.  Left-sided weakness has improved.  No dyspnea or chest pain.  Objective:   Vitals:   05/03/24 1900 05/03/24 2322  BP: (!) 138/98 (!) 132/95  Pulse: 76 73  Resp: 18 18  Temp: 97.7 F (36.5 C) 97.9 F (36.6 C)  TempSrc: Oral Oral  SpO2: 93% 99%    General exam: Young male, moderately built and nourished lying comfortably propped up in bed without distress. Respiratory system: Clear to auscultation. Respiratory effort normal. Cardiovascular system: S1 & S2 heard, RRR. No JVD, murmurs, rubs, gallops or clicks. No pedal edema.  Telemetry personally reviewed  at bedside: Sinus rhythm in the 60s. Gastrointestinal system: Abdomen is nondistended, soft and nontender. No organomegaly or masses  felt. Normal bowel sounds heard. Central nervous system: Alert and oriented. Subtle facial asymmetry with diminished right nasolabial fold.  Mild dysarthria present. Extremities: Left pronator sign with left-sided grade 4+ by 5 strength.  Right limbs grade 5 x 5 power. Skin: No rashes, lesions or ulcers Psychiatry: Judgement and insight appear normal. Mood & affect appropriate.     Data Reviewed:   I have personally reviewed following labs and imaging studies   CBC: Recent Labs  Lab 05/03/24 1153  WBC 7.2  NEUTROABS 4.0  HGB 13.2  HCT 40.4  MCV 92.9  PLT 488*    Basic Metabolic Panel: Recent Labs  Lab 05/03/24 1153  NA 138  K 3.9  CL 100  CO2 26  GLUCOSE 115*  BUN 7  CREATININE 0.87  CALCIUM 9.9    Liver Function Tests: Recent Labs  Lab 05/03/24 1153  AST 21  ALT <5  ALKPHOS 107  BILITOT 0.4  PROT 8.3*  ALBUMIN 4.1    CBG: Recent Labs  Lab 05/03/24 2145 05/04/24 0340  GLUCAP 177* 130*    Microbiology Studies:  No results found for this or any previous visit (from the past 240 hours).  Radiology Studies:  CT CHEST W CONTRAST Result Date: 05/04/2024 CLINICAL DATA:  Pulmonary lesion on chest x-ray concerning for neoplasm. * Tracking Code: BO * EXAM: CT CHEST WITH CONTRAST TECHNIQUE: Multidetector CT imaging of the chest was performed during intravenous contrast administration. RADIATION DOSE REDUCTION: This exam was performed according to the departmental dose-optimization program which includes automated exposure control, adjustment of the mA and/or kV according to patient size and/or use of iterative reconstruction technique. CONTRAST:  50mL OMNIPAQUE IOHEXOL 350 MG/ML SOLN COMPARISON:  Chest x-ray earlier same day.  Chest CT 04/04/2020 FINDINGS: Cardiovascular: The heart size is normal. No substantial pericardial effusion. No thoracic aortic aneurysm. Mediastinum/Nodes: 1.7 cm short axis necrotic precarinal lymph node evident. 1.8 cm short axis necrotic  right hilar lymph node. No left hilar lymphadenopathy. The esophagus has normal imaging features. There is no axillary lymphadenopathy. Lungs/Pleura: 3.1 x 3.5 x 3.4 cm right upper lobe pulmonary mass is tethered to the peripheral pleura. No other suspicious pulmonary nodule or mass. No pulmonary edema or pleural effusion. Centrilobular and paraseptal emphysema evident. Upper Abdomen: 3.7 x 2.8 x 6.7 cm heterogeneously enhancing right adrenal mass evident. 1.9 x 1.8 x 1.8 cm left adrenal nodule. Musculoskeletal: No worrisome lytic or sclerotic osseous abnormality. IMPRESSION: 1. 3.1 x 3.5 x 3.4 cm right upper lobe pulmonary mass is tethered to the peripheral pleura. Imaging features are compatible with primary bronchogenic neoplasm. 2. Necrotic right hilar and precarinal lymphadenopathy consistent with metastatic disease. 3. Bilateral adrenal nodules, right larger than left, consistent with metastatic disease. 4.  Emphysema (ICD10-J43.9). Electronically Signed   By: Camellia Candle M.D.   On: 05/04/2024 05:39   MR Brain W and Wo Contrast Result Date: 05/03/2024 EXAM: MRI BRAIN WITH AND WITHOUT CONTRAST 05/03/2024 02:05:09 PM TECHNIQUE: Multiplanar multisequence MRI of the head/brain was performed with and without the administration of intravenous contrast. 7 mL (gadobutrol (GADAVIST) 1 MMOL/ML injection 7 mL GADOBUTROL 1 MMOL/ML IV SOLN). COMPARISON: CT head 05/03/2024. CLINICAL HISTORY: Brain/CNS neoplasm, staging; Neuro deficit, acute, stroke suspected. Slurred speech, facial droop, dysphagia, and left sided numbness/weakness. FINDINGS: LIMITATIONS: The examination is intermittently up to moderately motion degraded. BRAIN AND VENTRICLES: Peripherally enhancing lesions measure  1.7 x 1.3 cm in the posterior right frontal lobe and 1.0 x 0.8 x 1.0 cm in the left parietal lobe. There is moderate associated vasogenic edema, greater in the posterior right frontal lobe, with associated sulcal effacement and slight mass  effect on the right greater than left lateral ventricles. There is no midline shift. A punctate focus of susceptibility associated with the right frontal lesion may reflect minimal blood products or mineralization. There is also a punctate 1.5-2 mm enhancing lesion in the superior left cerebellar hemisphere (series 18 image 48 and series 20 image 15) without edema. There is no restricted diffusion associated with these lesions. No acute infarct, hydrocephalus, or extra axial fluid collection is evident. Cerebral volume is normal. Major intracranial vascular flow voids are preserved. ORBITS: No acute abnormality. SINUSES: No acute abnormality. BONES AND SOFT TISSUES: Normal bone marrow signal and enhancement. Small right frontal scalp lipoma. IMPRESSION: 1. Enhancing lesions with edema in the right frontal and left parietal lobes as well as a punctate lesion in the left cerebellum, most consistent with metastatic disease. Electronically signed by: Dasie Hamburg MD 05/03/2024 03:24 PM EST RP Workstation: [id number]   DG Chest Port 1 View Result Date: 05/03/2024 EXAM: 1 VIEW(S) XRAY OF THE CHEST 05/03/2024 02:15:00 PM COMPARISON: Chest CT 04/04/2020. CLINICAL HISTORY: Brain lesions, slurred speech, weakness, and dysphasia. Possible metastases in brain. FINDINGS: LUNGS AND PLEURA: 3.2 cm right upper lobe mass. No pulmonary edema. No pleural effusion. No pneumothorax. HEART AND MEDIASTINUM: No acute abnormality of the cardiac and mediastinal silhouettes. BONES AND SOFT TISSUES: No acute osseous abnormality. IMPRESSION: 1. Right upper lobe pulmonary mass measuring 3.2 cm, suspicious for primary lung neoplasm; recommend dedicated chest CT for further characterization and staging evaluation, and oncologic referral. Electronically signed by: Maude Stammer MD 05/03/2024 02:29 PM EST RP Workstation: [id number]   CT HEAD WO CONTRAST Result Date: 05/03/2024 EXAM: CT HEAD WITHOUT CONTRAST 05/03/2024 12:06:07 PM TECHNIQUE:  CT of the head was performed without the administration of intravenous contrast. Automated exposure control, iterative reconstruction, and/or weight based adjustment of the mA/kV was utilized to reduce the radiation dose to as low as reasonably achievable. COMPARISON: None available. CLINICAL HISTORY: Neuro deficit, acute, stroke suspected. FINDINGS: BRAIN AND VENTRICLES: Hyperdense masses in the posterior right frontal lobe and left parietal lobe measure 1.3 cm and 1.0 cm, respectively. Associated moderate vasogenic edema is greater in the right frontal lobe. There is mild regional mass effect without significant midline shift. Elsewhere, no acute infarct, intracranial hemorrhage, hydrocephalus, or extra-axial fluid collection is identified. Cerebral volume is normal. ORBITS: No acute abnormality. SINUSES: No acute abnormality. SOFT TISSUES AND SKULL: No acute soft tissue abnormality. No skull fracture. IMPRESSION: 1. Right frontal and left parietal masses with moderate vasogenic edema. A brain MRI without and with IV contrast is recommended for further evaluation. Electronically signed by: Dasie Hamburg MD 05/03/2024 12:21 PM EST RP Workstation: [id number]    Scheduled Meds:    dexamethasone (DECADRON) injection  4 mg Intravenous Q6H   nicotine  7 mg Transdermal Daily    Continuous Infusions:     LOS: 1 day     Trenda Mar, MD,  FACP, [facility], [facility], [facility]   [facility] [place]      To contact the attending provider between 7A-7P or the covering provider during after hours 7P-7A, please log into the web site [email or web address] and access using universal [place] password for that web site. If you do not have the password,  please call the hospital operator.  05/04/2024, 9:14 AM

## 2024-05-05 ENCOUNTER — Inpatient Hospital Stay (HOSPITAL_COMMUNITY): Admitting: Anesthesiology

## 2024-05-05 ENCOUNTER — Inpatient Hospital Stay (HOSPITAL_COMMUNITY)

## 2024-05-05 ENCOUNTER — Other Ambulatory Visit (HOSPITAL_COMMUNITY): Payer: Self-pay

## 2024-05-05 ENCOUNTER — Encounter (HOSPITAL_COMMUNITY): Payer: Self-pay | Admitting: Internal Medicine

## 2024-05-05 ENCOUNTER — Encounter (HOSPITAL_COMMUNITY)
Admission: EM | Disposition: A | Discharge status: Home Health | Payer: Self-pay | Source: Home / Self Care | Attending: Internal Medicine

## 2024-05-05 ENCOUNTER — Encounter (HOSPITAL_COMMUNITY): Payer: Self-pay | Admitting: Anesthesiology

## 2024-05-05 ENCOUNTER — Ambulatory Visit: Admitting: Urology

## 2024-05-05 DIAGNOSIS — I10 Essential (primary) hypertension: Secondary | ICD-10-CM | POA: Diagnosis not present

## 2024-05-05 DIAGNOSIS — R918 Other nonspecific abnormal finding of lung field: Secondary | ICD-10-CM

## 2024-05-05 DIAGNOSIS — N529 Male erectile dysfunction, unspecified: Secondary | ICD-10-CM

## 2024-05-05 DIAGNOSIS — F17210 Nicotine dependence, cigarettes, uncomplicated: Secondary | ICD-10-CM

## 2024-05-05 DIAGNOSIS — C3490 Malignant neoplasm of unspecified part of unspecified bronchus or lung: Secondary | ICD-10-CM | POA: Diagnosis not present

## 2024-05-05 DIAGNOSIS — C7931 Secondary malignant neoplasm of brain: Secondary | ICD-10-CM | POA: Diagnosis not present

## 2024-05-05 LAB — GLUCOSE, CAPILLARY: Glucose-Capillary: 129 mg/dL — ABNORMAL HIGH (ref 70–99)

## 2024-05-05 SURGERY — CRANIOTOMY TUMOR EXCISION
Anesthesia: General | Laterality: Right

## 2024-05-05 SURGERY — BRONCHOSCOPY, WITH EBUS
Anesthesia: General | Laterality: Right

## 2024-05-05 MED ORDER — OXYCODONE HCL 5 MG/5ML PO SOLN
5.0000 mg | Freq: Once | ORAL | Status: DC | PRN
  Filled 2024-05-05: qty 5

## 2024-05-05 MED ORDER — FENTANYL CITRATE (PF) 250 MCG/5ML IJ SOLN
INTRAMUSCULAR | Status: DC | PRN
Start: 2024-05-05 — End: 2024-05-05
  Administered 2024-05-05 (×2): 50 ug via INTRAVENOUS

## 2024-05-05 MED ORDER — DEXAMETHASONE SOD PHOSPHATE PF 10 MG/ML IJ SOLN
INTRAMUSCULAR | Status: DC | PRN
  Administered 2024-05-05: 10 mg via INTRAVENOUS

## 2024-05-05 MED ORDER — LACTATED RINGERS IV SOLN: INTRAVENOUS | Status: DC

## 2024-05-05 MED ORDER — PROPOFOL 10 MG/ML IV BOLUS
INTRAVENOUS | Status: DC | PRN
  Administered 2024-05-05: 150 ug/kg/min via INTRAVENOUS
  Administered 2024-05-05: 150 mg via INTRAVENOUS

## 2024-05-05 MED ORDER — OXYCODONE HCL 5 MG PO TABS
5.0000 mg | ORAL_TABLET | Freq: Once | ORAL | Status: DC | PRN
  Filled 2024-05-05: qty 1

## 2024-05-05 MED ORDER — GABAPENTIN 400 MG PO CAPS: 400.0000 mg | ORAL_CAPSULE | Freq: Every day | ORAL | Status: DC | PRN

## 2024-05-05 MED ORDER — DEXAMETHASONE 4 MG PO TABS
4.0000 mg | ORAL_TABLET | Freq: Three times a day (TID) | ORAL | 0 refills | Status: DC
  Filled 2024-05-05: qty 90, 30d supply, fill #0

## 2024-05-05 MED ORDER — FENTANYL CITRATE (PF) 100 MCG/2ML IJ SOLN: 25.0000 ug | INTRAMUSCULAR | Status: DC | PRN

## 2024-05-05 MED ORDER — ACETAMINOPHEN 10 MG/ML IV SOLN: 1000.0000 mg | Freq: Once | INTRAVENOUS | Status: DC | PRN

## 2024-05-05 MED ORDER — ROCURONIUM BROMIDE 10 MG/ML (PF) SYRINGE
PREFILLED_SYRINGE | INTRAVENOUS | Status: DC | PRN
  Administered 2024-05-05: 50 mg via INTRAVENOUS

## 2024-05-05 MED ORDER — ONDANSETRON HCL 4 MG/2ML IJ SOLN
INTRAMUSCULAR | Status: DC | PRN
  Administered 2024-05-05: 4 mg via INTRAVENOUS

## 2024-05-05 MED ORDER — NICOTINE 7 MG/24HR TD PT24
7.0000 mg | MEDICATED_PATCH | Freq: Every day | TRANSDERMAL | 0 refills | Status: DC
  Filled 2024-05-05: qty 28, 28d supply, fill #0

## 2024-05-05 MED ORDER — POLYETHYLENE GLYCOL 3350 17 GM/SCOOP PO POWD
17.0000 g | Freq: Two times a day (BID) | ORAL | 0 refills | Status: DC
  Filled 2024-05-05: qty 238, 7d supply, fill #0

## 2024-05-05 MED ORDER — PHENYLEPHRINE HCL-NACL 20-0.9 MG/250ML-% IV SOLN
INTRAVENOUS | Status: DC | PRN
  Administered 2024-05-05: 40 ug/min via INTRAVENOUS

## 2024-05-05 MED ORDER — FENTANYL CITRATE (PF) 100 MCG/2ML IJ SOLN
INTRAMUSCULAR | Status: AC
  Filled 2024-05-05: qty 2

## 2024-05-05 MED ORDER — CHLORHEXIDINE GLUCONATE 0.12 % MT SOLN
OROMUCOSAL | Status: DC | PRN
  Administered 2024-05-05: 15 mL via OROMUCOSAL

## 2024-05-05 MED ORDER — SUGAMMADEX SODIUM 200 MG/2ML IV SOLN
INTRAVENOUS | Status: DC | PRN
  Administered 2024-05-05: 200 mg via INTRAVENOUS

## 2024-05-05 MED ORDER — CHLORHEXIDINE GLUCONATE 0.12 % MT SOLN
OROMUCOSAL | Status: AC
  Filled 2024-05-05: qty 15

## 2024-05-05 MED ORDER — DROPERIDOL 2.5 MG/ML IJ SOLN: 0.6250 mg | Freq: Once | INTRAMUSCULAR | Status: DC | PRN

## 2024-05-05 MED ORDER — LIDOCAINE 2% (20 MG/ML) 5 ML SYRINGE
INTRAMUSCULAR | Status: DC | PRN
  Administered 2024-05-05: 100 mg via INTRAVENOUS

## 2024-05-05 SURGICAL SUPPLY — 36 items
ADAPTER BRONCHOSCOPE OLYMPUS (ADAPTER) ×2 IMPLANT
ADAPTER VALVE BIOPSY EBUS (MISCELLANEOUS) IMPLANT
BAG COUNTER SPONGE SURGICOUNT (BAG) ×2 IMPLANT
BRUSH CYTOL CELLEBRITY 1.5X140 (MISCELLANEOUS) ×2 IMPLANT
BRUSH SUPERTRAX BIOPSY (INSTRUMENTS) IMPLANT
BRUSH SUPERTRAX NDL-TIP CYTO (INSTRUMENTS) ×2 IMPLANT
CANISTER SUCTION 3000ML PPV (SUCTIONS) ×2 IMPLANT
CNTNR URN SCR LID CUP LEK RST (MISCELLANEOUS) ×2 IMPLANT
COVER BACK TABLE 60X90IN (DRAPES) ×2 IMPLANT
FILTER STRAW FLUID ASPIR (MISCELLANEOUS) IMPLANT
FORCEPS BIOP 1.5 SINGLE USE (MISCELLANEOUS) ×2 IMPLANT
FORCEPS BIOP SUPERTRX PREMAR (INSTRUMENTS) ×2 IMPLANT
GAUZE SPONGE 4X4 12PLY STRL (GAUZE/BANDAGES/DRESSINGS) ×2 IMPLANT
GLOVE BIO SURGEON STRL SZ7.5 (GLOVE) ×4 IMPLANT
GOWN STRL REUS W/ TWL LRG LVL3 (GOWN DISPOSABLE) ×4 IMPLANT
KIT CLEAN ENDO COMPLIANCE (KITS) ×2 IMPLANT
KIT LOCATABLE GUIDE (CANNULA) IMPLANT
KIT MARKER FIDUCIAL DELIVERY (KITS) IMPLANT
KIT TURNOVER KIT B (KITS) ×2 IMPLANT
MARKER SKIN DUAL TIP RULER LAB (MISCELLANEOUS) ×2 IMPLANT
NDL SUPERTRX PREMARK BIOPSY (NEEDLE) ×2 IMPLANT
NEEDLE SUPERTRX PREMARK BIOPSY (NEEDLE) ×2 IMPLANT
OIL SILICONE PENTAX (PARTS (SERVICE/REPAIRS)) ×2 IMPLANT
PAD ARMBOARD POSITIONER FOAM (MISCELLANEOUS) ×4 IMPLANT
PATCHES PATIENT (LABEL) ×6 IMPLANT
SOLN 0.9% NACL POUR BTL 1000ML (IV SOLUTION) ×2 IMPLANT
SOLN STERILE WATER BTL 1000 ML (IV SOLUTION) ×2 IMPLANT
SYR 20ML ECCENTRIC (SYRINGE) ×2 IMPLANT
SYR 20ML LL LF (SYRINGE) ×2 IMPLANT
SYR 50ML SLIP (SYRINGE) ×2 IMPLANT
TOWEL GREEN STERILE FF (TOWEL DISPOSABLE) ×2 IMPLANT
TRAP SPECIMEN MUCUS 40CC (MISCELLANEOUS) IMPLANT
TUBE CONNECTING 20X1/4 (TUBING) ×2 IMPLANT
UNDERPAD 30X36 HEAVY ABSORB (UNDERPADS AND DIAPERS) ×2 IMPLANT
VALVE BIOPSY SINGLE USE (MISCELLANEOUS) ×2 IMPLANT
VALVE SUCTION BRONCHIO DISP (MISCELLANEOUS) ×2 IMPLANT

## 2024-05-05 NOTE — Discharge Summary (Signed)
 Physician Discharge Summary  Lucas Yu FMW:[id number] DOB: 1965/07/28  PCP: [facility]  Admitted from: Home Discharged to: Home  Admit date: 05/03/2024 Discharge date: 05/05/2024  Recommendations for Outpatient Follow-up:    Follow-up Information     [facility]) Follow up.   Specialty: Home Health Services Why: Home health agency will call you to arrange appointments for home health physical and occupational therapies. Contact information: [address] [place]  [phone]        [employer]. Call.   Why: Call for an appointment to be seen in the next 1 to 2 weeks. Contact information: [address] [place] [address] [phone]                  Home Health: Home Health Orders (From admission, onward)     Start     Ordered   05/05/24 1447  Home Health  At discharge       Question Answer Comment  To provide the following care/treatments PT   To provide the following care/treatments OT      05/05/24 1447             Equipment/Devices:     Durable Medical Equipment  (From admission, onward)           Start     Ordered   05/05/24 1448  For home use only DME Bedside commode  Once       Question:  Patient needs a bedside commode to treat with the following condition  Answer:  Physical deconditioning   05/05/24 1447   05/04/24 1540  For home use only DME Tub bench  Once        05/04/24 1541   05/04/24 1539  For home use only DME Walker rolling  Once       Question Answer Comment  Walker: With 5 Inch Wheels   Patient needs a walker to treat with the following condition Left-sided weakness      05/04/24 1541             Discharge Condition: Improved and stable   Code Status: Full Code Diet recommendation:  Discharge Diet Orders (From admission, onward)     Start     Ordered   05/05/24 0000  Diet - low sodium heart  healthy        05/05/24 1551             Discharge Diagnoses:  Principal Problem:   Lung mass Active Problems:   History of cigarette smoking   Essential hypertension   Brief Hospital Course:  58 year old male with medical history significant for HTN, GERD, marijuana and tobacco use, initially presented to [facility] with strokelike symptoms including left-sided facial droop, dysarthria, dysphagia, left-sided numbness and weakness with symptom onset 10/31.  Further workup revealed MRI brain with enhancing lesions with edema in the right frontal and left parietal lobe concerning for metastatic disease.  CT chest showed right upper lobe pulmonary mass.  EDP at [facility] discussed case with [facility] Neurosurgery and was transferred to [facility] for further evaluation by them.  PCCM consulted 11/12.  Radiation oncology on board.  S/p flexible video fiberoptic bronchoscopy and biopsies by PCCM on 11/13.  Patient insistent on DC home.  Cleared by PCCM.  Discussed in detail with Ms. Olam Brunner, Oncology NP prior to discharge and she will help coordinate oncology care at the cancer center.  In addition, also sent an ambulatory referral to Oncology.  Pathology's pending at time of discharge.     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 CT head without contrast 11/11: Right frontal and left parietal masses with moderate vasogenic edema. CT chest with contrast 11/11: 3.1 x 3.5 x 3.4 cm RUL pulmonary mass tethered to the peripheral pleura.  Imaging features compatible with primary bronchogenic neoplasm.  Necrotic right hilar and precarinal lymphadenopathy consistent with metastatic disease.  Bilateral adrenal nodules, right larger than left, consistent with metastatic disease.  MRI brain:  Enhancing lesions with edema in the right frontal and left parietal lobes as well as a punctate lesion in the left cerebellum, most consistent with metastatic disease. MRI abdomen with and without contrast: No evidence of hepatocellular carcinoma or metastatic disease to the liver.  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 11/12: Extensively communicated with neurosurgery, radiation oncology and PCCM via secure chat. Per neurosurgery, if pathology can be obtained by alternate route, no plans for craniotomy. S/p flexible video fiberoptic bronchoscopy and biopsies by PCCM on 11/13.  Pathology results to be followed as outpatient. Was on Decadron 4 mg IV Q6 hourly, at DC changed to Decadron 4 mg 3 times daily until oncology office visit when the dose can be adjusted.  Strokelike symptoms have improved but not resolved. Given reported dysphagia, evaluated by SLP who recommended regular consistency diet and thin liquids PT and OT evaluation and recommended home health services and DME as ordered above. Post procedure, discussed with Dr. Toribio Sharps, PCCM and communicated with Dr.Janjua, Neurosurgery both of whom recommended no need to follow-up with their services as outpatient. Also with an abundance of caution, advised patient that he should not drive due to some left-sided weakness until close outpatient follow-up and clearance by his outpatient physicians.   Tobacco and marijuana use disorder Emphysema on CT chest Cessation counseled. Continue nicotine patch   Essential hypertension Reasonable in patient control.  Not on antihypertensives PTA.       Consultants:   Neurosurgery PCCM   Procedures:   As above.   Discharge Instructions  Discharge Instructions     Ambulatory referral to Hematology / Oncology   Complete by: As directed    Admitted to [facility] with strokelike symptoms, found to have lung mass with brain lesions concerning for primary  bronchogenic carcinoma with brain mets.  PCCM performed bronchoscopy with biopsy 11/13.  Need to closely follow-up outpatient for pathology results and oncology treatment coordination.   Call MD for:   Complete by: As directed    Recurrent or worsening strokelike symptoms.   Call MD for:  difficulty breathing, headache or visual disturbances   Complete by: As directed    Call MD for:  extreme fatigue   Complete by: As directed    Call MD for:  persistant dizziness or light-headedness   Complete by: As directed    Call MD for:  persistant nausea and vomiting   Complete by: As directed    Call MD for:  severe uncontrolled pain   Complete by: As directed    Call MD for:  temperature >100.4   Complete by: As directed    Diet - low sodium heart healthy   Complete by: As directed    Driving Restrictions   Complete  by: As directed    No driving until cleared to do so by your physicians during office visit.   Increase activity slowly   Complete by: As directed         Medication List     TAKE these medications    cetirizine 10 MG tablet Commonly known as: ZYRTEC Take 10 mg by mouth daily as needed for allergies.   cyclobenzaprine  10 MG tablet Commonly known as: FLEXERIL  Take 10 mg by mouth 3 (three) times daily as needed for muscle spasms.   dexamethasone 4 MG tablet Commonly known as: DECADRON Take 1 tablet (4 mg total) by mouth 3 (three) times daily.   gabapentin  400 MG capsule Commonly known as: NEURONTIN  Take 1 capsule (400 mg total) by mouth daily as needed (for pain).   nicotine 7 mg/24hr patch Commonly known as: NICODERM CQ - dosed in mg/24 hr Place 1 patch (7 mg total) onto the skin daily. Start taking on: May 06, 2024   pantoprazole 40 MG tablet Commonly known as: Protonix Take 1 tablet (40 mg total) by mouth daily.   polyethylene glycol powder 17 GM/SCOOP powder Commonly known as: GLYCOLAX/MIRALAX Take 17 g by mouth 2 (two) times daily. Dissolve 1  capful (17g) in 4-8 ounces of liquid and take by mouth daily.   pregabalin 25 MG capsule Commonly known as: LYRICA Take 25 mg by mouth every 8 (eight) hours.   sucralfate 1 g tablet Commonly known as: Carafate Take 1 tablet (1 g total) by mouth 4 (four) times daily for 15 days.   tadalafil 20 MG tablet Commonly known as: CIALIS Take 20 mg by mouth once as needed for erectile dysfunction.       Allergies  Allergen Reactions   Penicillins     Has patient had a PCN reaction causing immediate rash, facial/tongue/throat swelling, SOB or lightheadedness with hypotension: No Has patient had a PCN reaction causing severe rash involving mucus membranes or skin necrosis: No Has patient had a PCN reaction that required hospitalization: No Has patient had a PCN reaction occurring within the last 10 years: No If all of the above answers are NO, then may proceed with Cephalosporin use.       Procedures/Studies: DG CHEST PORT 1 VIEW Result Date: 05/05/2024 CLINICAL DATA:  Post bronchoscopy with biopsy. EXAM: PORTABLE CHEST 1 VIEW COMPARISON:  05/03/2024 and CT 05/03/2024 FINDINGS: Lungs are hypoinflated and demonstrate evidence of patient's known right upper lobe nodule which is slightly less well-defined compatible recent bronchoscopy and biopsy. No pneumothorax. Remainder of the lungs are clear. Cardiomediastinal silhouette and remainder of the exam is unchanged. IMPRESSION: Hypoinflation with known right upper lobe nodule. No pneumothorax. Electronically Signed   By: Toribio Agreste M.D.   On: 05/05/2024 13:30   DG C-ARM BRONCHOSCOPY Result Date: 05/05/2024 C-ARM BRONCHOSCOPY: Fluoroscopy was utilized by the requesting physician.  No radiographic interpretation.   MR ABDOMEN W WO CONTRAST Result Date: 05/04/2024 EXAM: MRCP WITH AND WITHOUT IV CONTRAST 05/04/2024 08:24:35 AM TECHNIQUE: Multisequence, multiplanar magnetic resonance images of the abdomen with and without intravenous  contrast. 7 mL gadobutrol (GADAVIST) 1 MMOL/ML injection was administered. MRCP sequences were performed. COMPARISON: Chest CT 05/03/2024. CLINICAL HISTORY: Lung mass. FINDINGS: LIVER: No enhancing lesion within the liver to suggest a hepatocellular carcinoma. No enhancing lesion to suggest metastatic disease. GALLBLADDER AND BILIARY SYSTEM: Gallbladder is unremarkable. No intrahepatic or extrahepatic ductal dilation. SPLEEN: Unremarkable. PANCREAS/PANCREATIC DUCT: Visualized pancreas is unremarkable. No pancreatic ductal dilatation. ADRENAL GLANDS: Masslike enlargement  of the right adrenal gland to 3.6 x 2.3 cm on image 34 of series 802. This right adrenal mass has a suspicious enhancement pattern for metastatic disease. Similar findings in the left adrenal gland which is enlarged to 2.1 x 1.6 cm. KIDNEYS: Unremarkable. LYMPH NODES: No enlarged abdominal lymph nodes. VASCULATURE: Unremarkable. PERITONEUM: No ascites. ABDOMINAL WALL: No hernia. No mass. BOWEL: Grossly unremarkable. No bowel obstruction. BONES: No acute abnormality or worrisome osseous lesion. SOFT TISSUES: Unremarkable. MISCELLANEOUS: Unremarkable. IMPRESSION: 1. No evidence of hepatocellular carcinoma or metastatic disease in the liver. 2. 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 Electronically signed by: Norleen Boxer MD 05/04/2024 09:30 AM EST RP Workstation: [id number]   CT CHEST W CONTRAST Result Date: 05/04/2024 CLINICAL DATA:  Pulmonary lesion on chest x-ray concerning for neoplasm. * Tracking Code: BO * EXAM: CT CHEST WITH CONTRAST TECHNIQUE: Multidetector CT imaging of the chest was performed during intravenous contrast administration. RADIATION DOSE REDUCTION: This exam was performed according to the departmental dose-optimization program which includes automated exposure control, adjustment of the mA and/or kV according to patient size and/or use of iterative reconstruction technique. CONTRAST:  50mL  OMNIPAQUE IOHEXOL 350 MG/ML SOLN COMPARISON:  Chest x-ray earlier same day.  Chest CT 04/04/2020 FINDINGS: Cardiovascular: The heart size is normal. No substantial pericardial effusion. No thoracic aortic aneurysm. Mediastinum/Nodes: 1.7 cm short axis necrotic precarinal lymph node evident. 1.8 cm short axis necrotic right hilar lymph node. No left hilar lymphadenopathy. The esophagus has normal imaging features. There is no axillary lymphadenopathy. Lungs/Pleura: 3.1 x 3.5 x 3.4 cm right upper lobe pulmonary mass is tethered to the peripheral pleura. No other suspicious pulmonary nodule or mass. No pulmonary edema or pleural effusion. Centrilobular and paraseptal emphysema evident. Upper Abdomen: 3.7 x 2.8 x 6.7 cm heterogeneously enhancing right adrenal mass evident. 1.9 x 1.8 x 1.8 cm left adrenal nodule. Musculoskeletal: No worrisome lytic or sclerotic osseous abnormality. IMPRESSION: 1. 3.1 x 3.5 x 3.4 cm right upper lobe pulmonary mass is tethered to the peripheral pleura. Imaging features are compatible with primary bronchogenic neoplasm. 2. Necrotic right hilar and precarinal lymphadenopathy consistent with metastatic disease. 3. Bilateral adrenal nodules, right larger than left, consistent with metastatic disease. 4.  Emphysema (ICD10-J43.9). Electronically Signed   By: Camellia Candle M.D.   On: 05/04/2024 05:39   MR Brain W and Wo Contrast Result Date: 05/03/2024 EXAM: MRI BRAIN WITH AND WITHOUT CONTRAST 05/03/2024 02:05:09 PM TECHNIQUE: Multiplanar multisequence MRI of the head/brain was performed with and without the administration of intravenous contrast. 7 mL (gadobutrol (GADAVIST) 1 MMOL/ML injection 7 mL GADOBUTROL 1 MMOL/ML IV SOLN). COMPARISON: CT head 05/03/2024. CLINICAL HISTORY: Brain/CNS neoplasm, staging; Neuro deficit, acute, stroke suspected. Slurred speech, facial droop, dysphagia, and left sided numbness/weakness. FINDINGS: LIMITATIONS: The examination is intermittently up to moderately  motion degraded. BRAIN AND VENTRICLES: Peripherally enhancing lesions measure 1.7 x 1.3 cm in the posterior right frontal lobe and 1.0 x 0.8 x 1.0 cm in the left parietal lobe. There is moderate associated vasogenic edema, greater in the posterior right frontal lobe, with associated sulcal effacement and slight mass effect on the right greater than left lateral ventricles. There is no midline shift. A punctate focus of susceptibility associated with the right frontal lesion may reflect minimal blood products or mineralization. There is also a punctate 1.5-2 mm enhancing lesion in the superior left cerebellar hemisphere (series 18 image 48 and series 20 image 15) without edema. There is no restricted diffusion associated with  these lesions. No acute infarct, hydrocephalus, or extra axial fluid collection is evident. Cerebral volume is normal. Major intracranial vascular flow voids are preserved. ORBITS: No acute abnormality. SINUSES: No acute abnormality. BONES AND SOFT TISSUES: Normal bone marrow signal and enhancement. Small right frontal scalp lipoma. IMPRESSION: 1. Enhancing lesions with edema in the right frontal and left parietal lobes as well as a punctate lesion in the left cerebellum, most consistent with metastatic disease. Electronically signed by: Dasie Hamburg MD 05/03/2024 03:24 PM EST RP Workstation: [id number]   DG Chest Port 1 View Result Date: 05/03/2024 EXAM: 1 VIEW(S) XRAY OF THE CHEST 05/03/2024 02:15:00 PM COMPARISON: Chest CT 04/04/2020. CLINICAL HISTORY: Brain lesions, slurred speech, weakness, and dysphasia. Possible metastases in brain. FINDINGS: LUNGS AND PLEURA: 3.2 cm right upper lobe mass. No pulmonary edema. No pleural effusion. No pneumothorax. HEART AND MEDIASTINUM: No acute abnormality of the cardiac and mediastinal silhouettes. BONES AND SOFT TISSUES: No acute osseous abnormality. IMPRESSION: 1. Right upper lobe pulmonary mass measuring 3.2 cm, suspicious for primary lung  neoplasm; recommend dedicated chest CT for further characterization and staging evaluation, and oncologic referral. Electronically signed by: Maude Stammer MD 05/03/2024 02:29 PM EST RP Workstation: [id number]   CT HEAD WO CONTRAST Result Date: 05/03/2024 EXAM: CT HEAD WITHOUT CONTRAST 05/03/2024 12:06:07 PM TECHNIQUE: CT of the head was performed without the administration of intravenous contrast. Automated exposure control, iterative reconstruction, and/or weight based adjustment of the mA/kV was utilized to reduce the radiation dose to as low as reasonably achievable. COMPARISON: None available. CLINICAL HISTORY: Neuro deficit, acute, stroke suspected. FINDINGS: BRAIN AND VENTRICLES: Hyperdense masses in the posterior right frontal lobe and left parietal lobe measure 1.3 cm and 1.0 cm, respectively. Associated moderate vasogenic edema is greater in the right frontal lobe. There is mild regional mass effect without significant midline shift. Elsewhere, no acute infarct, intracranial hemorrhage, hydrocephalus, or extra-axial fluid collection is identified. Cerebral volume is normal. ORBITS: No acute abnormality. SINUSES: No acute abnormality. SOFT TISSUES AND SKULL: No acute soft tissue abnormality. No skull fracture. IMPRESSION: 1. Right frontal and left parietal masses with moderate vasogenic edema. A brain MRI without and with IV contrast is recommended for further evaluation. Electronically signed by: Dasie Hamburg MD 05/03/2024 12:21 PM EST RP Workstation: [id number]    Subjective: Seen this morning prior to procedures.  Reports that he feels much better.  Still has some slurred speech and left-sided weakness and have not completely resolved.  Discharge Exam:  Vitals:   05/05/24 1256 05/05/24 1300 05/05/24 1310 05/05/24 1546  BP:  (!) 125/100 125/89 (!) 133/97  Pulse: 80 78 70 87  Resp: (!) 22 16 19 15   Temp:    97.6 F (36.4 C)  TempSrc:    Oral  SpO2: 97% 100% 98% 94%  Weight:       Height:        General exam: Young male, moderately built and nourished sitting up comfortably in chair without distress. Respiratory system: Clear to auscultation. Respiratory effort normal. Cardiovascular system: S1 & S2 heard, RRR. No JVD, murmurs, rubs, gallops or clicks. No pedal edema.  Telemetry personally reviewed: Sinus rhythm. Gastrointestinal system: Abdomen is nondistended, soft and nontender. No organomegaly or masses felt. Normal bowel sounds heard. Central nervous system: Alert and oriented.  Persistent subtle facial asymmetry with diminished right nasolabial fold.  Mild dysarthria present. Extremities: Left pronator sign with left-sided grade 4+ by 5 strength.  Right limbs grade 5 x 5 power. Skin: No rashes, lesions  or ulcers Psychiatry: Judgement and insight appear normal. Mood & affect appropriate.     The results of significant diagnostics from this hospitalization (including imaging, microbiology, ancillary and laboratory) are listed below for reference.     Microbiology: No results found for this or any previous visit (from the past 240 hours).   Labs: CBC: Recent Labs  Lab 05/03/24 1153  WBC 7.2  NEUTROABS 4.0  HGB 13.2  HCT 40.4  MCV 92.9  PLT 488*    Basic Metabolic Panel: Recent Labs  Lab 05/03/24 1153  NA 138  K 3.9  CL 100  CO2 26  GLUCOSE 115*  BUN 7  CREATININE 0.87  CALCIUM 9.9    Liver Function Tests: Recent Labs  Lab 05/03/24 1153  AST 21  ALT <5  ALKPHOS 107  BILITOT 0.4  PROT 8.3*  ALBUMIN 4.1    CBG: Recent Labs  Lab 05/03/24 2145 05/04/24 0340 05/04/24 1147 05/04/24 2012 05/05/24 0335  GLUCAP 177* 130* 133* 141* 129*      Time coordinating discharge: 35 minutes  SIGNED:  Trenda Mar, MD,  FACP, [facility], [facility], [facility]   [facility] Advisor [place]     To contact the attending provider between 7A-7P or the covering provider during after hours 7P-7A, please log into the  web site [email or web address] and access using universal [place] password for that web site. If you do not have the password, please call the hospital operator.

## 2024-05-05 NOTE — Progress Notes (Signed)
 NAME:  Lucas Yu, MRN:  [id number], DOB:  Jan 03, 1966, LOS: 2 ADMISSION DATE:  05/03/2024, CONSULTATION DATE:  05/04/24 REFERRING MD:  Judeth, CHIEF COMPLAINT:  weakness   History of Present Illness:  58 year old man w/ hx of HTN, tobacco abuse presenting with trouble speaking, swallowing and some left sided weakness for last couple weeks.    Workup has revealed widely metastatic cancer with easiest avenue for tissue being lung.  Started on decadron, medOnc, RadOnc, NSGY, PCCM consulted.  Patient currently feels okay, no hemoptysis, wants to know when he can go home.  Pertinent  Medical History  HTN Tobacco abuse  Significant Hospital Events: Including procedures, antibiotic start and stop dates in addition to other pertinent events   11/12 admit  Interim History / Subjective:   Reviewed risks of bronchoscopy with patient, he has agreed to proceed with procedure. No complaints at this time.  Objective    Blood pressure (!) 144/88, pulse 69, temperature 97.6 F (36.4 C), temperature source Temporal, resp. rate 19, height 5' 7 (1.702 m), weight 71.7 kg, SpO2 98%.       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  - has been NPO - proceed with navigation bronchoscopy - HemeOnc, RadOnc, NSGY already aware - Steroids as ordered  Labs   CBC: Recent Labs  Lab 05/03/24 1153  WBC 7.2  NEUTROABS 4.0  HGB 13.2  HCT 40.4   MCV 92.9  PLT 488*    Basic Metabolic Panel: Recent Labs  Lab 05/03/24 1153  NA 138  K 3.9  CL 100  CO2 26  GLUCOSE 115*  BUN 7  CREATININE 0.87  CALCIUM 9.9   GFR: Estimated Creatinine Clearance: 84.4 mL/min (by C-G formula based on SCr of 0.87 mg/dL). Recent Labs  Lab 05/03/24 1153  WBC 7.2    Liver Function Tests: Recent Labs  Lab 05/03/24 1153  AST 21  ALT <5  ALKPHOS 107  BILITOT 0.4  PROT 8.3*  ALBUMIN 4.1   No results for input(s): LIPASE, AMYLASE in the last 168 hours. No results for input(s): AMMONIA in the last 168 hours.  ABG No results found for: PHART, PCO2ART, PO2ART, HCO3, TCO2, ACIDBASEDEF, O2SAT   Coagulation Profile: Recent Labs  Lab 05/03/24 1153  INR 1.1    Cardiac Enzymes: No results for input(s): CKTOTAL, CKMB, CKMBINDEX, TROPONINI in the last 168 hours.  HbA1C: Hgb A1c MFr Bld  Date/Time Value Ref Range Status  11/30/2019 10:58 AM 5.5 4.8 - 5.6 % Final    Comment:             Prediabetes: 5.7 - 6.4          Diabetes: >6.4          Glycemic control for adults with diabetes: <7.0     CBG: Recent Labs  Lab 05/03/24 2145 05/04/24 0340 05/04/24 1147 05/04/24 2012 05/05/24 0335  GLUCAP 177* 130* 133* 141* 129*  Critical care time: N/A     Dorn Chill, MD [facility] Office: [phone]   See Amion for personal pager PCCM on call pager [phone] until 7pm. Please call Elink 7p-7a. [phone]

## 2024-05-05 NOTE — Anesthesia Preprocedure Evaluation (Addendum)
 Anesthesia Evaluation  Patient identified by MRN, date of birth, ID band Patient awake    Reviewed: Allergy & Precautions, H&P , NPO status , Patient's Chart, lab work & pertinent test results  Airway Mallampati: II  TM Distance: >3 FB Neck ROM: Full    Dental no notable dental hx.    Pulmonary Current Smoker and Patient abstained from smoking. MRI brain with enhancing lesions with edema in the right frontal and left parietal lobe concerning for metastatic disease   Pulmonary exam normal breath sounds clear to auscultation       Cardiovascular hypertension, (-) angina (-) Past MI Normal cardiovascular exam Rhythm:Regular Rate:Normal     Neuro/Psych Left sided weakness CVA, Residual Symptoms  negative psych ROS   GI/Hepatic negative GI ROS, Neg liver ROS,neg GERD  ,,  Endo/Other  negative endocrine ROSneg diabetes    Renal/GU Renal diseasenegative Renal ROS  negative genitourinary   Musculoskeletal negative musculoskeletal ROS (+)    Abdominal   Peds negative pediatric ROS (+)  Hematology negative hematology ROS (+)   Anesthesia Other Findings Adrenal mets  Reproductive/Obstetrics negative OB ROS                              Anesthesia Physical Anesthesia Plan  ASA: 3  Anesthesia Plan: General   Post-op Pain Management:    Induction: Intravenous  PONV Risk Score and Plan: 1 and Ondansetron  and Dexamethasone  Airway Management Planned: Natural Airway and Simple Face Mask  Additional Equipment:   Intra-op Plan:   Post-operative Plan: Extubation in OR  Informed Consent: I have reviewed the patients History and Physical, chart, labs and discussed the procedure including the risks, benefits and alternatives for the proposed anesthesia with the patient or authorized representative who has indicated his/her understanding and acceptance.     Dental advisory given  Plan Discussed  with: CRNA  Anesthesia Plan Comments:          Anesthesia Quick Evaluation

## 2024-05-05 NOTE — Op Note (Signed)
 Video Bronchoscopy with Robotic Assisted Bronchoscopic Navigation   Date of Operation: 05/05/2024   Pre-op Diagnosis: RUL Lung Mass with adenopathy and brain mets  Post-op Diagnosis: same  Surgeon: Dorn Chill, MD  Assistants: n/a  Anesthesia: General endotracheal anesthesia  Operation: Flexible video fiberoptic bronchoscopy with robotic assistance and biopsies.  Estimated Blood Loss: Minimal  Complications: None  Indications and History: PAT SIRES is a 58 y.o. male with history of smoking with new findings or lung mass, mediastinal adenopathy and brain lesion.  Recommendation made to achieve a tissue diagnosis via robotic assisted navigational bronchoscopy.  The risks, benefits, complications, treatment options and expected outcomes were discussed with the patient.  The possibilities of pneumothorax, pneumonia, reaction to medication, pulmonary aspiration, perforation of a viscus, bleeding, failure to diagnose a condition and creating a complication requiring transfusion or operation were discussed with the patient who freely signed the consent.    Description of Procedure: The patient was seen in the Preoperative Area, was examined and was deemed appropriate to proceed.  The patient was taken to [facility] Endoscopy room 3, identified as Nadara JONETTA Gosling and the procedure verified as Flexible Video Fiberoptic Bronchoscopy.  A Time Out was held and the above information confirmed.   Prior to the date of the procedure a high-resolution CT scan of the chest was performed. Utilizing ION software program a virtual tracheobronchial tree was generated to allow the creation of distinct navigation pathways to the patient's parenchymal abnormalities. After being taken to the operating room general anesthesia was initiated and the patient  was orally intubated. The video fiberoptic bronchoscope was introduced via the endotracheal tube and a general inspection was performed which showed normal right  and left lung anatomy. Aspiration of the bilateral mainstems was completed to remove any remaining secretions. Robotic catheter inserted into patient's endotracheal tube.   Target #1 RUL Mass: The distinct navigation pathways prepared prior to this procedure were then utilized to navigate to patient's lesion identified on CT scan. The robotic catheter was secured into place and the vision probe was withdrawn.  Lesion location was approximated using fluoroscopy.  Local registration and targeting was performed using [employer] Cios mobile C-arm three-dimensional imaging. Under fluoroscopic guidance transbronchial needle biopsies and transbronchial forceps biopsies were performed to be sent for cytology and pathology.  Needle-in-lesion was confirmed using Cios mobile C-arm.  A bronchioalveolar lavage was performed in the RUL and sent for cytology.  Under fluoroscopic guidance a single fiducial marker was placed adjacent to the nodule.  At the end of the procedure a general airway inspection was performed and there was no evidence of active bleeding. The bronchoscope was removed.  The patient tolerated the procedure well. There was no significant blood loss and there were no obvious complications. A post-procedural chest x-ray is pending.  Samples Target #1: 1. Transbronchial needle biopsies from RUL Mass 2. Transbronchial forceps biopsies from RUL Mass 3. Bronchoalveolar lavage from RUL    Plans:  The patient will be recover in the PACU and return to room after chest x-ray is reviewed. We will review the cytology, pathology and microbiology results with the patient when they become available.

## 2024-05-05 NOTE — Progress Notes (Signed)
 This chaplain responded to the RN-Nate page for assistance in creating the Pt. Advance Directive. The Pt. significant other-Tandria is visiting at the bedside as the Pt. prepares for discharge.  The Pt. completed AD education with chaplain. After answering the chaplain's clarifying questions, the Pt. filled out the AD. The chaplain understands the Pt. will locate a notary for completion and return the document to the hospital for uploading in Epic.  This chaplain is available for F/U spiritual care as needed.  Chaplain Leeroy Hummer [phone]

## 2024-05-05 NOTE — Anesthesia Postprocedure Evaluation (Signed)
 Anesthesia Post Note  Patient: Lucas Yu  Procedure(s) Performed: BRONCHOSCOPY, WITH EBUS (Right) VIDEO BRONCHOSCOPY WITH ENDOBRONCHIAL NAVIGATION     Patient location during evaluation: PACU Anesthesia Type: General Level of consciousness: awake and alert Pain management: pain level controlled Vital Signs Assessment: post-procedure vital signs reviewed and stable Respiratory status: spontaneous breathing, nonlabored ventilation, respiratory function stable and patient connected to nasal cannula oxygen Cardiovascular status: blood pressure returned to baseline and stable Postop Assessment: no apparent nausea or vomiting Anesthetic complications: no   No notable events documented.  Last Vitals:  Vitals:   05/05/24 1300 05/05/24 1310  BP: (!) 125/100 125/89  Pulse: 78 70  Resp: 16 19  Temp:    SpO2: 100% 98%    Last Pain:  Vitals:   05/05/24 1310  TempSrc:   PainSc: 0-No pain                 Thom JONELLE Peoples

## 2024-05-05 NOTE — Transfer of Care (Signed)
 Immediate Anesthesia Transfer of Care Note  Patient: Lucas Yu  Procedure(s) Performed: BRONCHOSCOPY, WITH EBUS (Right) VIDEO BRONCHOSCOPY WITH ENDOBRONCHIAL NAVIGATION  Patient Location: PACU  Anesthesia Type:General  Level of Consciousness: drowsy  Airway & Oxygen Therapy: Patient Spontanous Breathing and Patient connected to face mask oxygen  Post-op Assessment: Report given to RN and Post -op Vital signs reviewed and stable  Post vital signs: Reviewed and stable  Last Vitals:  Vitals Value Taken Time  BP 128/81   Temp 97.5   Pulse 75 05/05/24 12:38  Resp 18 05/05/24 12:38  SpO2 100 % 05/05/24 12:38  Vitals shown include unfiled device data.  Last Pain:  Vitals:   05/05/24 1026  TempSrc: Temporal  PainSc: 0-No pain      Patients Stated Pain Goal: 0 (05/04/24 1506)  Complications: No notable events documented.

## 2024-05-05 NOTE — Evaluation (Signed)
 Occupational Therapy Evaluation Patient Details Name: Lucas Yu MRN: [id number] DOB: [birth year] Today's Date: 05/05/2024   History of Present Illness   Pt is a 58 y.o. male who presented to [facility] with strokelike sx including L facial droop, dysarthria, dysphagia, and L numbness and weakness on 10/31. Brain MRI revealed metastases in R frontal lobe, L parietal lobe, and L cerebellum. Other imaging showed R pulmonary mass and adrenal masses. Transferred to [facility] for further evaluation by neurosurgery. Med hx significant for HTN, GERD, marijuana and tobacco use.     Clinical Impressions PTA patient reports mobilizing with rollator, having assist for ADLs/ IADLs as needed from aide and fiancee.  Pt admitted for above and presents with problem list below, including L sided weakness, impaired coordination and sensation, decreased activity tolerance, pain, impaired balance, and cognition.  Completing transfers and mobility using RW given contact guard assist, needing setup to min assist for Adls.  Cognitively, pt/fiancee endorse decreased short term memory since stroke like symptoms began and pt scored 12/28 on short blessed test with deficits in problem solving and recall.  Based on performance today, pt will best benefit from continued OT services acutely and after dc at [facility] level to optimize independence and safety with ADLs, IADLs and mobility.       If plan is discharge home, recommend the following:   A little help with walking and/or transfers;A little help with bathing/dressing/bathroom;Assistance with cooking/housework;Direct supervision/assist for medications management;Direct supervision/assist for financial management;Help with stairs or ramp for entrance;Assist for transportation;Supervision due to cognitive status     Functional Status Assessment   Patient has had a recent decline in their functional status and demonstrates the ability to make significant improvements in function in  a reasonable and predictable amount of time.     Equipment Recommendations   BSC/3in1     Recommendations for Other Services         Precautions/Restrictions   Precautions Precautions: Fall Recall of Precautions/Restrictions: Intact     Mobility Bed Mobility               General bed mobility comments: OOB upon entry    Transfers Overall transfer level: Needs assistance Equipment used: Rolling walker (2 wheels) Transfers: Sit to/from Stand Sit to Stand: Contact guard assist           General transfer comment: for safety      Balance Overall balance assessment: Needs assistance Sitting-balance support: Feet supported, No upper extremity supported Sitting balance-Leahy Scale: Fair     Standing balance support: During functional activity, Bilateral upper extremity supported, No upper extremity supported Standing balance-Leahy Scale: Poor Standing balance comment: can stand statically with close supervision but relies on RW dynamcially                           ADL either performed or assessed with clinical judgement   ADL Overall ADL's : Needs assistance/impaired     Grooming: Contact guard assist;Standing           Upper Body Dressing : Contact guard assist;Sitting   Lower Body Dressing: Sit to/from stand;Minimal assistance   Toilet Transfer: Contact guard assist;Ambulation;Rolling walker (2 wheels)           Functional mobility during ADLs: Contact guard assist;Rolling walker (2 wheels)       Vision Baseline Vision/History: 1 Wears glasses Ability to See in Adequate Light: 0 Adequate Patient Visual Report: Blurring of vision (L eye)  Vision Assessment?: Yes Eye Alignment: Within Functional Limits Ocular Range of Motion: Within Functional Limits Alignment/Gaze Preference: Within Defined Limits Tracking/Visual Pursuits: Able to track stimulus in all quads without difficulty Visual Fields: No apparent  deficits Additional Comments: pt with blurry vision on L side per pt, noted improvements with wearing glasses.  overall functional .     Perception         Praxis         Pertinent Vitals/Pain Pain Assessment Pain Assessment: 0-10 Pain Score: 8  Pain Location: hips Pain Descriptors / Indicators: Discomfort Pain Intervention(s): Limited activity within patient's tolerance, Monitored during session     Extremity/Trunk Assessment Upper Extremity Assessment Upper Extremity Assessment: LUE deficits/detail LUE Deficits / Details: grossly weaker than R UE, drift noted and MMT 3+/5, decreased sensation and coordination LUE Sensation: decreased light touch LUE Coordination: decreased fine motor   Lower Extremity Assessment Lower Extremity Assessment: Defer to PT evaluation   Cervical / Trunk Assessment Cervical / Trunk Assessment: Normal   Communication Communication Communication: No apparent difficulties   Cognition Arousal: Alert Behavior During Therapy: WFL for tasks assessed/performed Cognition: Cognition impaired       Memory impairment (select all impairments): Short-term memory, Working Biochemist, Clinical functioning impairment (select all impairments): Problem solving OT - Cognition Comments: patient reports some decreased recall since inital symptoms, fiancee confirms.  He scored 12/28 on short blessed test, deficits in recall and needing intermittent cues to sequence.                 Following commands: Intact       Cueing  General Comments   Cueing Techniques: Verbal cues      Exercises     Shoulder Instructions      Home Living Family/patient expects to be discharged to:: Private residence Living Arrangements: Spouse/significant other Available Help at Discharge: Family;Available 24 hours/day Type of Home: Apartment Home Access: Level entry     Home Layout: One level     Bathroom Shower/Tub: Chief Strategy Officer:  Standard Bathroom Accessibility: Yes   Home Equipment: Rollator (4 wheels);Cane - single point          Prior Functioning/Environment Prior Level of Function : Needs assist             Mobility Comments: Denies falls in last 6 months. States he has been mostly using rollator for mobility the last few weeks. ADLs Comments: Aide comes 2 hrs/day, 5 days/week to help with ADLs and IADLs as needed; has aide or fiancee to help in shower. Dola assists with meds, fiances, meals and driving.    OT Problem List: Decreased strength;Decreased activity tolerance;Impaired balance (sitting and/or standing);Impaired vision/perception;Decreased coordination;Decreased cognition;Decreased safety awareness;Decreased knowledge of use of DME or AE;Decreased knowledge of precautions;Pain;Impaired sensation   OT Treatment/Interventions: Self-care/ADL training;Therapeutic exercise;DME and/or AE instruction;Therapeutic activities;Cognitive remediation/compensation;Patient/family education;Balance training      OT Goals(Current goals can be found in the care plan section)   Acute Rehab OT Goals Patient Stated Goal: home OT Goal Formulation: With patient Time For Goal Achievement: 05/19/24 Potential to Achieve Goals: Good   OT Frequency:  Min 2X/week    Co-evaluation              AM-PAC OT 6 Clicks Daily Activity     Outcome Measure Help from another person eating meals?: A Little Help from another person taking care of personal grooming?: A Little Help from another person toileting, which includes using toliet,  bedpan, or urinal?: A Little Help from another person bathing (including washing, rinsing, drying)?: A Little Help from another person to put on and taking off regular upper body clothing?: A Little Help from another person to put on and taking off regular lower body clothing?: A Little 6 Click Score: 18   End of Session Equipment Utilized During Treatment: Rolling walker (2  wheels) Nurse Communication: Mobility status;Precautions  Activity Tolerance: Patient tolerated treatment well Patient left: in chair;with call bell/phone within reach  OT Visit Diagnosis: Other abnormalities of gait and mobility (R26.89);Pain;Other symptoms and signs involving the nervous system (R29.898);Other symptoms and signs involving cognitive function Pain - Right/Left:  (bilk) Pain - part of body: Hip                Time: 9058-8995 OT Time Calculation (min): 23 min Charges:  OT General Charges $OT Visit: 1 Visit OT Evaluation $OT Eval Moderate Complexity: 1 Mod OT Treatments $Self Care/Home Management : 8-22 mins  Etta NOVAK, OT Acute Rehabilitation Services Office [phone] Secure Chat Preferred   Etta GORMAN Hope 05/05/2024, 11:27 AM

## 2024-05-05 NOTE — Discharge Instructions (Signed)

## 2024-05-05 NOTE — Anesthesia Procedure Notes (Signed)
 Procedure Name: Intubation Date/Time: 05/05/2024 11:23 AM  Performed by: Atanacio Arland HERO, CRNAPre-anesthesia Checklist: Patient identified, Emergency Drugs available, Suction available and Patient being monitored Patient Re-evaluated:Patient Re-evaluated prior to induction Oxygen Delivery Method: Circle System Utilized Preoxygenation: Pre-oxygenation with 100% oxygen Induction Type: IV induction Ventilation: Mask ventilation without difficulty Laryngoscope Size: Mac and 4 Grade View: Grade I Tube type: Oral Tube size: 8.5 mm Number of attempts: 1 Airway Equipment and Method: Stylet Placement Confirmation: ETT inserted through vocal cords under direct vision, positive ETCO2 and breath sounds checked- equal and bilateral Secured at: 23 cm Tube secured with: Tape Dental Injury: Teeth and Oropharynx as per pre-operative assessment

## 2024-05-05 NOTE — Procedures (Signed)
 Procedure Note  Patient: Lucas Yu  [employer] Cios mobile C-arm was utilized to identify and biopsy RUL Mass.  Needle-in-lesion was confirmed using real-time Cios imaging, and images were uploaded to PACS.   Catheter Near RUL Mass:   Needle in Lesion, RUL Mass:   Dorn Chill, MD [facility] Office: [phone]   See Amion for personal pager PCCM on call pager [phone] until 7pm. Please call Elink 7p-7a. [phone]

## 2024-05-05 NOTE — TOC Transition Note (Signed)
 Transition of Care [facility]) - Discharge Note   Patient Details  Name: Lucas Yu MRN: [id number] Date of Birth: [birth year]  [facility] CM/SW Contact:  Brittie Whisnant M, RN Phone Number: 05/05/2024, 12:20pm   Clinical Narrative:    Patient for likely discharge home today with significant other and family to provide needed assistance.  PT/OT recommending HH follow up and wife agreeable to follow up.  Referral to [facility] for continued therapies at home; tub bench and RW have been delivered to room from [place].  OT now recommending BSC; [employer] to deliver prior to dc.    Final next level of care: Home w Home Health Services Barriers to Discharge: Barriers Resolved   Patient Goals and CMS Choice   CMS Medicare.gov Compare Post Acute Care list provided to:: Patient Represenative (must comment) (wife) Choice offered to / list presented to : Spouse                            Discharge Plan and Services Additional resources added to the After Visit Summary for     Discharge Planning Services: CM Consult Post Acute Care Choice: Durable Medical Equipment, Home Health          DME Arranged: Tub bench, Walker rolling, Bedside commode DME Agency: [employer] Date DME Agency Contacted: 05/05/24 Time DME Agency Contacted: [phone] Representative spoke with at DME Agency: [place] Budge HH Arranged: PT, OT HH Agency: [facility] Date [facility] Agency Contacted: 05/05/24 Time HH Agency Contacted: 1508 Representative spoke with at [facility] Agency: Burnard Bucks  Social Drivers of Health (SDOH) Interventions SDOH Screenings   Food Insecurity: No Food Insecurity (05/04/2024)  Housing: Low Risk  (05/04/2024)  Transportation Needs: No Transportation Needs (05/04/2024)  Utilities: Not At Risk (05/04/2024)  Tobacco Use: High Risk (05/05/2024)     Readmission Risk Interventions     No data to display         Mliss MICAEL Fass, RN, BSN  Trauma/Neuro ICU  Case Manager [phone]

## 2024-05-06 ENCOUNTER — Other Ambulatory Visit: Payer: Self-pay | Admitting: Radiation Therapy

## 2024-05-06 DIAGNOSIS — C7931 Secondary malignant neoplasm of brain: Secondary | ICD-10-CM

## 2024-05-06 NOTE — Progress Notes (Signed)
 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 Walker? Wheelchair?: ***  SAFETY ISSUES: Prior radiation? *** Pacemaker/ICD? *** Possible current pregnancy? *** Is the patient on methotrexate? ***  Additional Complaints / other details:  ***

## 2024-05-07 ENCOUNTER — Encounter (HOSPITAL_COMMUNITY): Payer: Self-pay | Admitting: Pulmonary Disease

## 2024-05-09 ENCOUNTER — Encounter: Payer: Self-pay | Admitting: Genetic Counselor

## 2024-05-09 ENCOUNTER — Ambulatory Visit (HOSPITAL_COMMUNITY)

## 2024-05-09 ENCOUNTER — Encounter (HOSPITAL_COMMUNITY): Payer: Self-pay

## 2024-05-09 ENCOUNTER — Inpatient Hospital Stay: Attending: Neurosurgery

## 2024-05-10 ENCOUNTER — Ambulatory Visit: Admission: RE | Admit: 2024-05-10 | Source: Ambulatory Visit

## 2024-05-10 ENCOUNTER — Other Ambulatory Visit: Payer: Self-pay

## 2024-05-10 ENCOUNTER — Ambulatory Visit (HOSPITAL_COMMUNITY)
Admission: RE | Admit: 2024-05-10 | Discharge: 2024-05-10 | Disposition: A | Discharge status: Home / Self Care | Source: Ambulatory Visit | Attending: Radiation Oncology | Admitting: Radiation Oncology

## 2024-05-10 DIAGNOSIS — C7931 Secondary malignant neoplasm of brain: Secondary | ICD-10-CM

## 2024-05-10 MED ORDER — GADOBUTROL 1 MMOL/ML IV SOLN
7.0000 mL | Freq: Once | INTRAVENOUS | Status: AC | PRN
  Administered 2024-05-10: 7 mL via INTRAVENOUS

## 2024-05-10 NOTE — Progress Notes (Signed)
 Has armband been applied?  {yes no:314532}  Does patient have an allergy to IV contrast dye?: {yes no:314532}   Has patient ever received premedication for IV contrast dye?: {yes no:314532}   Date of lab work: {Time; dates multiple:15870} BUN: 8 CR: 0.88 eGFR: >60  Does patient take metformin?: {yes no:314532}  Is eGFR >60?: {yes no:314532} If no, when can patient resume? (Must be 48 hrs AFTER they receive IV contrast):  {Time; dates multiple:15870}  IV site: {iv locations:314275}  Has IV site been added to flowsheet?  {yes no:314532}  There were no vitals taken for this visit.

## 2024-05-11 ENCOUNTER — Ambulatory Visit

## 2024-05-11 ENCOUNTER — Ambulatory Visit
Admission: RE | Admit: 2024-05-11 | Discharge: 2024-05-11 | Disposition: A | Discharge status: Home / Self Care | Source: Ambulatory Visit | Attending: Radiation Oncology | Admitting: Radiation Oncology

## 2024-05-11 ENCOUNTER — Ambulatory Visit (HOSPITAL_BASED_OUTPATIENT_CLINIC_OR_DEPARTMENT_OTHER)
Admission: RE | Admit: 2024-05-11 | Discharge: 2024-05-11 | Disposition: A | Discharge status: Home / Self Care | Source: Ambulatory Visit | Attending: Radiology | Admitting: Radiology

## 2024-05-11 ENCOUNTER — Encounter: Payer: Self-pay | Admitting: Radiation Oncology

## 2024-05-11 ENCOUNTER — Other Ambulatory Visit: Payer: Self-pay | Admitting: Internal Medicine

## 2024-05-11 ENCOUNTER — Other Ambulatory Visit (HOSPITAL_COMMUNITY): Payer: Self-pay

## 2024-05-11 VITALS — BP 142/97 | HR 90 | Temp 97.4°F | Resp 18 | Ht 67.0 in | Wt 152.6 lb

## 2024-05-11 DIAGNOSIS — Z510 Encounter for antineoplastic radiation therapy: Secondary | ICD-10-CM | POA: Insufficient documentation

## 2024-05-11 DIAGNOSIS — C3411 Malignant neoplasm of upper lobe, right bronchus or lung: Secondary | ICD-10-CM | POA: Insufficient documentation

## 2024-05-11 DIAGNOSIS — C7931 Secondary malignant neoplasm of brain: Secondary | ICD-10-CM

## 2024-05-11 DIAGNOSIS — F17200 Nicotine dependence, unspecified, uncomplicated: Secondary | ICD-10-CM

## 2024-05-11 DIAGNOSIS — R918 Other nonspecific abnormal finding of lung field: Secondary | ICD-10-CM

## 2024-05-11 DIAGNOSIS — M79604 Pain in right leg: Secondary | ICD-10-CM | POA: Insufficient documentation

## 2024-05-11 DIAGNOSIS — M79605 Pain in left leg: Secondary | ICD-10-CM | POA: Insufficient documentation

## 2024-05-11 LAB — CMP (CANCER CENTER ONLY)
ALT: 9 U/L (ref 0–44)
AST: 21 U/L (ref 15–41)
Albumin: 4 g/dL (ref 3.5–5.0)
Alkaline Phosphatase: 98 U/L (ref 38–126)
Anion gap: 12 (ref 5–15)
BUN: 16 mg/dL (ref 6–20)
CO2: 26 mmol/L (ref 22–32)
Calcium: 9.6 mg/dL (ref 8.9–10.3)
Chloride: 99 mmol/L (ref 98–111)
Creatinine: 0.87 mg/dL (ref 0.61–1.24)
GFR, Estimated: >60 mL/min (ref >60)
Glucose, Bld: 133 mg/dL — ABNORMAL HIGH (ref 70–99)
Potassium: 3.7 mmol/L (ref 3.5–5.1)
Sodium: 137 mmol/L (ref 135–145)
Total Bilirubin: 0.2 mg/dL (ref 0.0–1.2)
Total Protein: 7.6 g/dL (ref 6.5–8.1)

## 2024-05-11 LAB — CYTOLOGY - NON PAP

## 2024-05-11 MED ORDER — NICOTINE 7 MG/24HR TD PT24
7.0000 mg | MEDICATED_PATCH | Freq: Every day | TRANSDERMAL | 0 refills | Status: DC
  Filled 2024-05-11: qty 14, 14d supply, fill #0

## 2024-05-11 MED ORDER — LORAZEPAM 0.5 MG PO TABS
ORAL_TABLET | ORAL | 0 refills | Status: DC
  Filled 2024-05-11: qty 2, 1d supply, fill #0

## 2024-05-11 MED ORDER — NICOTINE 21 MG/24HR TD PT24
21.0000 mg | MEDICATED_PATCH | Freq: Every day | TRANSDERMAL | 2 refills | Status: DC
  Filled 2024-05-11: qty 14, 14d supply, fill #0

## 2024-05-11 MED ORDER — NICOTINE 14 MG/24HR TD PT24
14.0000 mg | MEDICATED_PATCH | Freq: Every day | TRANSDERMAL | 0 refills | Status: DC
  Filled 2024-05-11: qty 14, 14d supply, fill #0

## 2024-05-11 MED ORDER — LORAZEPAM 1 MG PO TABS
0.5000 mg | ORAL_TABLET | Freq: Once | ORAL | Status: AC
  Administered 2024-05-11: 0.5 mg via ORAL

## 2024-05-11 MED ORDER — LORAZEPAM 1 MG PO TABS: 0.5000 mg | ORAL_TABLET | Freq: Once | ORAL | Status: DC

## 2024-05-11 NOTE — Progress Notes (Signed)
 Bilateral lower extremity venous duplex has been completed. Preliminary results can be found in CV Proc through chart review.  Results were given to Leeroy Due PA.  05/11/24 12:36 PM Cathlyn Collet RVT

## 2024-05-11 NOTE — Progress Notes (Signed)
 Radiation Oncology         [phone] ________________________________  Initial outpatient Consultation  Name: Lucas Yu MRN: [id number]  Date: 05/11/2024  DOB: Oct 02, 1965  RR:[facility], [facility]*   REFERRING PHYSICIAN: [facility]*  DIAGNOSIS:     ICD-10-CM   1. Metastasis to brain [facility])  C79.31     2. 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 male who presented to the ED on 05/03/24 complaining of multiple neurologic complaints including facial droop, slurred speech, difficulty swallowing, and left-sided numbness/weakness in the upper and lower extremity with symptoms having that has persisted for the prior two week and worsening since onset.  He then underwent a CT head showing a right frontal and left parietal masses with moderate vasogenic edema. Subsequently, he underwent a brain MRI showing a peripherally enhancing lesions measure 1.7 x 1.3 cm in the posterior right frontal lobe and 1.0 x 0.8 x 1.0 cm in the left parietal lobe. There is moderate associated vasogenic edema, greater in the posterior right frontal lobe, with associated sulcal effacement and slight mass effect on the right greater than left lateral ventricles.   Neurology was consulted at that time and believed that mass is likely metastasis and requested transfer to [facility].   He then underwent a CT chest showing a 3.1 x 3.5 x 3.4 cm right upper lobe pulmonary mass is tethered to the peripheral pleura and a 1.7 cm short axis necrotic precarinal lymph node evident. 1.8 cm short axis necrotic right hilar lymph node with no left hilar lymphadenopathy. scan also noted a 3.7 x 2.8 x 6.7 cm heterogeneously enhancing right adrenal mass evident. 1.9 x 1.8 x 1.8 cm left adrenal nodule. Right adrenal mass has a  suspicious enhancement pattern for metastatic disease.   In light of findings, he then underwent a bronchoscopy with EBUS on 05/05/24 under the care of Dr. Kara. Surgical pathology demonstrated non small cell carcinoma.   MRI of the brain on 05/10/24 demonstrated a total of four enhancing brain metastases with stable vasogenic edema in the posterior right frontal lobe and posterior left hemisphere.   Today the patient is present with his supportive daughter and fiance. He notes left sided diplopia and left sided weakness that have improved since being on Decadron. He is taking 4 mg Decadron TID, as prescribed. He also notes an approximate 4 day history of bilateral calf pain with pain greater on the left than the right. He developed left foot swelling last night, but denies any redness or warmth. He denies any headaches or nausea. He denies any shortness of breath, cough, unintended weight loss, fevers or chills.    PREVIOUS RADIATION THERAPY: No  PAST MEDICAL HISTORY:  has a past medical history of Hypertension.    PAST SURGICAL HISTORY: Past Surgical History:  Procedure Laterality Date   VIDEO BRONCHOSCOPY WITH ENDOBRONCHIAL NAVIGATION N/A 05/05/2024   Procedure: VIDEO BRONCHOSCOPY WITH ENDOBRONCHIAL NAVIGATION;  Surgeon: Kara Dorn NOVAK, MD;  Location: [facility] ENDOSCOPY;  Service: Pulmonary;  Laterality: N/A;   VIDEO BRONCHOSCOPY WITH ENDOBRONCHIAL ULTRASOUND Right 05/05/2024   Procedure: BRONCHOSCOPY, WITH EBUS;  Surgeon: Kara Dorn NOVAK, MD;  Location: [facility] ENDOSCOPY;  Service: Pulmonary;  Laterality: Right;    FAMILY HISTORY: family history includes Cancer  in his father; Hypertension in his father.  SOCIAL HISTORY:  reports that he has been smoking cigarettes. He has a 44 pack-year smoking history. He has never used smokeless tobacco. He reports current alcohol use of about 7.0 standard drinks of alcohol per week. He reports current drug use. Drug: Marijuana.  ALLERGIES:  Penicillins  MEDICATIONS:  Current Outpatient Medications  Medication Sig Dispense Refill   cetirizine (ZYRTEC) 10 MG tablet Take 10 mg by mouth daily as needed for allergies.     cyclobenzaprine  (FLEXERIL ) 10 MG tablet Take 10 mg by mouth 3 (three) times daily as needed for muscle spasms.     dexamethasone (DECADRON) 4 MG tablet Take 1 tablet (4 mg total) by mouth 3 (three) times daily. 90 tablet 0   gabapentin  (NEURONTIN ) 400 MG capsule Take 1 capsule (400 mg total) by mouth daily as needed (for pain).     nicotine (NICODERM CQ - DOSED IN MG/24 HR) 7 mg/24hr patch Place 1 patch (7 mg total) onto the skin daily. 28 patch 0   pantoprazole (PROTONIX) 40 MG tablet Take 1 tablet (40 mg total) by mouth daily. 30 tablet 1   polyethylene glycol powder (GLYCOLAX/MIRALAX) 17 GM/SCOOP powder Take 17 g by mouth 2 (two) times daily. Dissolve 1 capful (17g) in 4-8 ounces of liquid and take by mouth daily. 238 g 0   pregabalin (LYRICA) 25 MG capsule Take 25 mg by mouth every 8 (eight) hours.     sucralfate (CARAFATE) 1 g tablet Take 1 tablet (1 g total) by mouth 4 (four) times daily for 15 days. 60 tablet 0   tadalafil (CIALIS) 20 MG tablet Take 20 mg by mouth once as needed for erectile dysfunction.     No current facility-administered medications for this encounter.   Facility-Administered Medications Ordered in Other Encounters  Medication Dose Route Frequency Provider Last Rate Last Admin   LORazepam (ATIVAN) tablet 0.5 mg  0.5 mg Oral Once Muir, Ellen M, PA-C        REVIEW OF SYSTEMS:  As above.   PHYSICAL EXAM:  height is 5' 7 (1.702 m) and weight is 152 lb 9.6 oz (69.2 kg). His temperature is 97.4 F (36.3 C) (abnormal). His blood pressure is 142/97 (abnormal) and his pulse is 90. His respiration is 18 and oxygen saturation is 100%.   General: Alert and oriented, in no acute distress.  HEENT: Head is normocephalic. Extraocular movements are intact. Oropharynx is clear. Neck: Neck is supple,  no palpable cervical or supraclavicular lymphadenopathy. Heart: Regular in rate and rhythm with no murmurs, rubs, or gallops. Chest: Clear to auscultation bilaterally, with no rhonchi, wheezes, or rales. Abdomen: Soft, nontender, nondistended, with no rigidity or guarding. Extremities: No cyanosis or edema.Tenderness to palpation of bilateral calves. No erythema, warmth, or swelling.  Lymphatics: see Neck Exam Skin: No concerning lesions. Musculoskeletal: symmetric strength and muscle tone throughout. Neurologic: Cranial nerves II through XII are grossly intact. No obvious focalities. Speech is fluent. Coordination is intact. Psychiatric: Judgment and insight are intact. Affect is appropriate.   LABORATORY DATA:  Lab Results  Component Value Date   WBC 7.2 05/03/2024   HGB 13.2 05/03/2024   HCT 40.4 05/03/2024   MCV 92.9 05/03/2024   PLT 488 (H) 05/03/2024   CMP     Component Value Date/Time   NA 137 05/11/2024 0901   NA 140 11/30/2019 1058   NA 139 01/16/2014 0703   K 3.7 05/11/2024 0901   K 3.8 01/16/2014 0703  CL 99 05/11/2024 0901   CL 105 01/16/2014 0703   CO2 26 05/11/2024 0901   CO2 25 01/16/2014 0703   GLUCOSE 133 (H) 05/11/2024 0901   GLUCOSE 98 01/16/2014 0703   BUN 16 05/11/2024 0901   BUN 11 11/30/2019 1058   BUN 11 01/16/2014 0703   CREATININE 0.87 05/11/2024 0901   CREATININE 1.06 01/16/2014 0703   CALCIUM 9.6 05/11/2024 0901   CALCIUM 8.2 (L) 01/16/2014 0703   PROT 7.6 05/11/2024 0901   PROT 8.1 02/01/2020 1037   PROT 8.4 (H) 01/16/2014 0703   ALBUMIN 4.0 05/11/2024 0901   ALBUMIN 4.5 02/01/2020 1037   ALBUMIN 3.8 01/16/2014 0703   AST 21 05/11/2024 0901   ALT 9 05/11/2024 0901   ALT 321 (H) 01/16/2014 0703   ALKPHOS 98 05/11/2024 0901   ALKPHOS 105 01/16/2014 0703   BILITOT 0.2 05/11/2024 0901   GFRNONAA >60 05/11/2024 0901   GFRNONAA >60 01/16/2014 0703         RADIOGRAPHY: MR Brain W Wo Contrast Result Date: 05/11/2024 EXAM: MRI BRAIN  WITH AND WITHOUT CONTRAST 05/10/2024 04:46:18 PM TECHNIQUE: Multiplanar multisequence MRI of the head/brain was performed with and without the administration of intravenous contrast. CONTRAST: 7 mL of Gadavist. COMPARISON: MR Head 05/03/2024. CLINICAL HISTORY: 58 year old male with brain metastases, pretreatment planning. FINDINGS: BRAIN AND VENTRICLES: Subcentimeter left superior cerebellar enhancing metastasis measures 3 to 4 mm on series 1100 image 124. Left superior occipital versus inferior parietal lobe thick rim enhancing metastasis is 12 mm on series 1100 image 172. More solid round and mildly lobulated enhancing metastasis in the posterior right frontal lobe is 18 mm on series 1100 image 198. Punctate enhancing metastasis in the anterior left frontal lobe is visible on both series 1100 image 204 and series 9 image 15. No other brain metastases identified. Vasogenic edema in the posterior right frontal lobe and the posterior left hemisphere is stable. There is no significant midline shift or intracranial mass effect. Normal underlying brain volume. No convincing intracranial hemorrhage. No acute infarct. No hydrocephalus. The sella is unremarkable. Normal flow voids. No dural thickening identified. Negative visible cervical spine and spinal cord. ORBITS: No acute abnormality. SINUSES: No acute abnormality. BONES AND SOFT TISSUES: Visible bone marrow signal is within normal limits. Negative visible cervical spine. No acute soft tissue abnormality. IMPRESSION: 1. Total of four enhancing brain metastases on 3T ranging from punctate (anterior left frontal) to 18 mm (posterior right frontal), all annotated on series 1100. 2. Stable vasogenic edema in the posterior right frontal lobe and posterior left hemisphere. No significant intracranial mass effect. Electronically signed by: Helayne Hurst MD 05/11/2024 09:48 AM EST RP Workstation: [id number]   DG CHEST PORT 1 VIEW Result Date: 05/05/2024 CLINICAL DATA:   Post bronchoscopy with biopsy. EXAM: PORTABLE CHEST 1 VIEW COMPARISON:  05/03/2024 and CT 05/03/2024 FINDINGS: Lungs are hypoinflated and demonstrate evidence of patient's known right upper lobe nodule which is slightly less well-defined compatible recent bronchoscopy and biopsy. No pneumothorax. Remainder of the lungs are clear. Cardiomediastinal silhouette and remainder of the exam is unchanged. IMPRESSION: Hypoinflation with known right upper lobe nodule. No pneumothorax. Electronically Signed   By: Toribio Agreste M.D.   On: 05/05/2024 13:30   DG C-ARM BRONCHOSCOPY Result Date: 05/05/2024 C-ARM BRONCHOSCOPY: Fluoroscopy was utilized by the requesting physician.  No radiographic interpretation.   MR ABDOMEN W WO CONTRAST Result Date: 05/04/2024 EXAM: MRCP WITH AND WITHOUT IV CONTRAST 05/04/2024 08:24:35 AM TECHNIQUE: Multisequence, multiplanar magnetic resonance images  of the abdomen with and without intravenous contrast. 7 mL gadobutrol (GADAVIST) 1 MMOL/ML injection was administered. MRCP sequences were performed. COMPARISON: Chest CT 05/03/2024. CLINICAL HISTORY: Lung mass. FINDINGS: LIVER: No enhancing lesion within the liver to suggest a hepatocellular carcinoma. No enhancing lesion to suggest metastatic disease. GALLBLADDER AND BILIARY SYSTEM: Gallbladder is unremarkable. No intrahepatic or extrahepatic ductal dilation. SPLEEN: Unremarkable. PANCREAS/PANCREATIC DUCT: Visualized pancreas is unremarkable. No pancreatic ductal dilatation. ADRENAL GLANDS: Masslike enlargement of the right adrenal gland to 3.6 x 2.3 cm on image 34 of series 802. This right adrenal mass has a suspicious enhancement pattern for metastatic disease. Similar findings in the left adrenal gland which is enlarged to 2.1 x 1.6 cm. KIDNEYS: Unremarkable. LYMPH NODES: No enlarged abdominal lymph nodes. VASCULATURE: Unremarkable. PERITONEUM: No ascites. ABDOMINAL WALL: No hernia. No mass. BOWEL: Grossly unremarkable. No bowel  obstruction. BONES: No acute abnormality or worrisome osseous lesion. SOFT TISSUES: Unremarkable. MISCELLANEOUS: Unremarkable. IMPRESSION: 1. No evidence of hepatocellular carcinoma or metastatic disease in the liver. 2. 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 Electronically signed by: Norleen Boxer MD 05/04/2024 09:30 AM EST RP Workstation: [id number]   CT CHEST W CONTRAST Result Date: 05/04/2024 CLINICAL DATA:  Pulmonary lesion on chest x-ray concerning for neoplasm. * Tracking Code: BO * EXAM: CT CHEST WITH CONTRAST TECHNIQUE: Multidetector CT imaging of the chest was performed during intravenous contrast administration. RADIATION DOSE REDUCTION: This exam was performed according to the departmental dose-optimization program which includes automated exposure control, adjustment of the mA and/or kV according to patient size and/or use of iterative reconstruction technique. CONTRAST:  50mL OMNIPAQUE IOHEXOL 350 MG/ML SOLN COMPARISON:  Chest x-ray earlier same day.  Chest CT 04/04/2020 FINDINGS: Cardiovascular: The heart size is normal. No substantial pericardial effusion. No thoracic aortic aneurysm. Mediastinum/Nodes: 1.7 cm short axis necrotic precarinal lymph node evident. 1.8 cm short axis necrotic right hilar lymph node. No left hilar lymphadenopathy. The esophagus has normal imaging features. There is no axillary lymphadenopathy. Lungs/Pleura: 3.1 x 3.5 x 3.4 cm right upper lobe pulmonary mass is tethered to the peripheral pleura. No other suspicious pulmonary nodule or mass. No pulmonary edema or pleural effusion. Centrilobular and paraseptal emphysema evident. Upper Abdomen: 3.7 x 2.8 x 6.7 cm heterogeneously enhancing right adrenal mass evident. 1.9 x 1.8 x 1.8 cm left adrenal nodule. Musculoskeletal: No worrisome lytic or sclerotic osseous abnormality. IMPRESSION: 1. 3.1 x 3.5 x 3.4 cm right upper lobe pulmonary mass is tethered to the peripheral pleura. Imaging  features are compatible with primary bronchogenic neoplasm. 2. Necrotic right hilar and precarinal lymphadenopathy consistent with metastatic disease. 3. Bilateral adrenal nodules, right larger than left, consistent with metastatic disease. 4.  Emphysema (ICD10-J43.9). Electronically Signed   By: Camellia Candle M.D.   On: 05/04/2024 05:39   MR Brain W and Wo Contrast Result Date: 05/03/2024 EXAM: MRI BRAIN WITH AND WITHOUT CONTRAST 05/03/2024 02:05:09 PM TECHNIQUE: Multiplanar multisequence MRI of the head/brain was performed with and without the administration of intravenous contrast. 7 mL (gadobutrol (GADAVIST) 1 MMOL/ML injection 7 mL GADOBUTROL 1 MMOL/ML IV SOLN). COMPARISON: CT head 05/03/2024. CLINICAL HISTORY: Brain/CNS neoplasm, staging; Neuro deficit, acute, stroke suspected. Slurred speech, facial droop, dysphagia, and left sided numbness/weakness. FINDINGS: LIMITATIONS: The examination is intermittently up to moderately motion degraded. BRAIN AND VENTRICLES: Peripherally enhancing lesions measure 1.7 x 1.3 cm in the posterior right frontal lobe and 1.0 x 0.8 x 1.0 cm in the left parietal lobe. There is moderate associated vasogenic edema, greater  in the posterior right frontal lobe, with associated sulcal effacement and slight mass effect on the right greater than left lateral ventricles. There is no midline shift. A punctate focus of susceptibility associated with the right frontal lesion may reflect minimal blood products or mineralization. There is also a punctate 1.5-2 mm enhancing lesion in the superior left cerebellar hemisphere (series 18 image 48 and series 20 image 15) without edema. There is no restricted diffusion associated with these lesions. No acute infarct, hydrocephalus, or extra axial fluid collection is evident. Cerebral volume is normal. Major intracranial vascular flow voids are preserved. ORBITS: No acute abnormality. SINUSES: No acute abnormality. BONES AND SOFT TISSUES: Normal  bone marrow signal and enhancement. Small right frontal scalp lipoma. IMPRESSION: 1. Enhancing lesions with edema in the right frontal and left parietal lobes as well as a punctate lesion in the left cerebellum, most consistent with metastatic disease. Electronically signed by: Dasie Hamburg MD 05/03/2024 03:24 PM EST RP Workstation: [id number]   DG Chest Port 1 View Result Date: 05/03/2024 EXAM: 1 VIEW(S) XRAY OF THE CHEST 05/03/2024 02:15:00 PM COMPARISON: Chest CT 04/04/2020. CLINICAL HISTORY: Brain lesions, slurred speech, weakness, and dysphasia. Possible metastases in brain. FINDINGS: LUNGS AND PLEURA: 3.2 cm right upper lobe mass. No pulmonary edema. No pleural effusion. No pneumothorax. HEART AND MEDIASTINUM: No acute abnormality of the cardiac and mediastinal silhouettes. BONES AND SOFT TISSUES: No acute osseous abnormality. IMPRESSION: 1. Right upper lobe pulmonary mass measuring 3.2 cm, suspicious for primary lung neoplasm; recommend dedicated chest CT for further characterization and staging evaluation, and oncologic referral. Electronically signed by: Maude Stammer MD 05/03/2024 02:29 PM EST RP Workstation: [id number]   CT HEAD WO CONTRAST Result Date: 05/03/2024 EXAM: CT HEAD WITHOUT CONTRAST 05/03/2024 12:06:07 PM TECHNIQUE: CT of the head was performed without the administration of intravenous contrast. Automated exposure control, iterative reconstruction, and/or weight based adjustment of the mA/kV was utilized to reduce the radiation dose to as low as reasonably achievable. COMPARISON: None available. CLINICAL HISTORY: Neuro deficit, acute, stroke suspected. FINDINGS: BRAIN AND VENTRICLES: Hyperdense masses in the posterior right frontal lobe and left parietal lobe measure 1.3 cm and 1.0 cm, respectively. Associated moderate vasogenic edema is greater in the right frontal lobe. There is mild regional mass effect without significant midline shift. Elsewhere, no acute infarct, intracranial  hemorrhage, hydrocephalus, or extra-axial fluid collection is identified. Cerebral volume is normal. ORBITS: No acute abnormality. SINUSES: No acute abnormality. SOFT TISSUES AND SKULL: No acute soft tissue abnormality. No skull fracture. IMPRESSION: 1. Right frontal and left parietal masses with moderate vasogenic edema. A brain MRI without and with IV contrast is recommended for further evaluation. Electronically signed by: Dasie Hamburg MD 05/03/2024 12:21 PM EST RP Workstation: [id number]   CT ABDOMEN PELVIS W CONTRAST Result Date: 04/21/2024 CLINICAL DATA:  Acute abdominal pain.  Nausea and vomiting. EXAM: CT ABDOMEN AND PELVIS WITH CONTRAST TECHNIQUE: Multidetector CT imaging of the abdomen and pelvis was performed using the standard protocol following bolus administration of intravenous contrast. RADIATION DOSE REDUCTION: This exam was performed according to the departmental dose-optimization program which includes automated exposure control, adjustment of the mA and/or kV according to patient size and/or use of iterative reconstruction technique. CONTRAST:  OMNIPAQUE  IOHEXOL  300 MG/ML  SOLN COMPARISON:  None Available. FINDINGS: Lower Chest: No acute findings. Hepatobiliary:  No suspicious hepatic masses identified. Pancreas: Focal area of hyperenhancement is seen in segment 2 of the left hepatic lobe which is peripheral and somewhat wedge-shaped in appearance.  This is suggestive of a vascular shunt, with hepatic mass considered less likely. Gallbladder is unremarkable. No evidence of biliary ductal dilatation. Spleen: Within normal limits in size and appearance. Adrenals/Urinary Tract: Heterogeneously enhancing right adrenal mass is seen measuring 4.7 x 2.1 cm. Normal left adrenal gland and kidneys. No evidence of ureteral calculi or hydronephrosis. Unremarkable unopacified urinary bladder. Stomach/Bowel: No evidence of obstruction, inflammatory process or abnormal fluid collections. Colonic  diverticulosis noted, without signs of diverticulitis. Vascular/Lymphatic: No pathologically enlarged lymph nodes. No acute vascular findings. Reproductive:  No mass or other significant abnormality. Other:  None. Musculoskeletal:  No suspicious bone lesions identified. IMPRESSION: No acute findings. Colonic diverticulosis, without radiographic evidence of diverticulitis. Indeterminate right adrenal mass. Recommend abdomen MRI without and with contrast for further characterization. Focal area of hyperenhancement in left hepatic lobe, which is suggestive of a vascular shunt, with hepatic mass considered less likely. This can also be further characterized by MRI. Electronically Signed   By: Norleen DELENA Kil M.D.   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  He presents today with a total of four brain metastases from NSCLC of the RUL. Dr. Izell recommends stereotactic radiosurgery [facility]) to the four lesions.   We discussed the natural history of brain metastases and general treatment, highlighting the role of radiotherapy in the management.  We discussed the available radiation techniques, and focused on the details of logistics and delivery.  We reviewed the anticipated acute and late sequelae associated with radiation in this setting. Side effects include, but are not limited to; fatigue, headaches, dizziness, hair loss, and edema. Additionally, we spoke about radionecrosis or brain injury that can result from stereotactic radiosurgery. The patient and his family were encouraged to ask questions that we answered to the best of our ability. He would like to proceed with radiation. A patient consent form was discussed and signed.  We retained a copy for our records.   CT simulation will take place later today and treatment on 05/16/2024.  Dr. Izell plans to deliver 20 Gy in 1 fraction to the  four visualized brain metastases. We look forward to participating in this patient's care.   Patient is scheduled to see medical oncology on 05/12/2024 to discuss systemic treatment options.   Bilateral calf pain  Patient presents with an approximate 4-day history of worsening bilateral calf pain (left > right) with some left foot swelling. No signs concerning for clot on PE however we have ordered U/S of bilateral lower extremities to rule out DVT.   Claustrophobia  He was given Ativan  in-office today to help with treatment planning. Prescription of 0.5 mg Ativan  sent to preferred pharmacy today to help with treatment and follow-up MRI. He understands that he should not drive after taking this medication due to its sedating effects.   Tobacco Use   Patient currently smoking approximately 1 ppd but is interested in quitting. Smoking cessation instruction/counseling given:  counseled patient on the dangers of tobacco use, advised patient to stop smoking, and reviewed strategies to maximize success. Prescription for nicotine  patches sent to his pharmacy to help with this. ( TOBACCO CESSATION COUNSELING: I asked the patient today about tobacco use. The patient uses tobacco.  I advised the patient to quit. Services were offered by me today including outpatient counseling and pharmacotherapy. I assessed for the willingness to attempt to quit and  provided encouragement and demonstrated willingness to make referrals and/or prescriptions to help the patient attempt to quit. The patient has follow-up with the oncologic team to touch base on their tobacco use and /or cessation efforts.  Over 3 minutes were spent on this issue. )    On date of service, in total, we spent 60 minutes on this encounter. Patient was seen in person.   __________________________________________    Leeroy Due, PA-C   Lauraine Golden, MD    [facility]  Radiation Oncology Direct Dial: [phone]  Fax:  [phone] [email or web address]    This document serves as a record of services personally performed by Lauraine Golden, MD. It was created on her behalf by Reymundo Cartwright, a trained medical scribe. The creation of this record is based on the scribe's personal observations and the provider's statements to them. This document has been checked and approved by the attending provider.

## 2024-05-12 ENCOUNTER — Inpatient Hospital Stay (HOSPITAL_BASED_OUTPATIENT_CLINIC_OR_DEPARTMENT_OTHER): Admitting: Internal Medicine

## 2024-05-12 ENCOUNTER — Inpatient Hospital Stay

## 2024-05-12 ENCOUNTER — Other Ambulatory Visit: Payer: Self-pay

## 2024-05-12 ENCOUNTER — Other Ambulatory Visit (HOSPITAL_COMMUNITY): Payer: Self-pay

## 2024-05-12 VITALS — BP 133/88 | HR 79 | Temp 97.7°F | Resp 17 | Ht 67.0 in | Wt 154.0 lb

## 2024-05-12 DIAGNOSIS — C3411 Malignant neoplasm of upper lobe, right bronchus or lung: Secondary | ICD-10-CM | POA: Diagnosis not present

## 2024-05-12 DIAGNOSIS — C3490 Malignant neoplasm of unspecified part of unspecified bronchus or lung: Secondary | ICD-10-CM | POA: Diagnosis not present

## 2024-05-12 DIAGNOSIS — R918 Other nonspecific abnormal finding of lung field: Secondary | ICD-10-CM

## 2024-05-12 LAB — CBC WITH DIFFERENTIAL (CANCER CENTER ONLY)
Abs Immature Granulocytes: 0.15 K/uL — ABNORMAL HIGH (ref 0.00–0.07)
Basophils Absolute: 0 K/uL (ref 0.0–0.1)
Basophils Relative: 0 %
Eosinophils Absolute: 0 K/uL (ref 0.0–0.5)
Eosinophils Relative: 0 %
HCT: 37.8 % — ABNORMAL LOW (ref 39.0–52.0)
Hemoglobin: 12.9 g/dL — ABNORMAL LOW (ref 13.0–17.0)
Immature Granulocytes: 1 %
Lymphocytes Relative: 12 %
Lymphs Abs: 1.9 K/uL (ref 0.7–4.0)
MCH: 30.6 pg (ref 26.0–34.0)
MCHC: 34.1 g/dL (ref 30.0–36.0)
MCV: 89.8 fL (ref 80.0–100.0)
Monocytes Absolute: 1.5 K/uL — ABNORMAL HIGH (ref 0.1–1.0)
Monocytes Relative: 9 %
Neutro Abs: 12.5 K/uL — ABNORMAL HIGH (ref 1.7–7.7)
Neutrophils Relative %: 78 %
Platelet Count: 493 K/uL — ABNORMAL HIGH (ref 150–400)
RBC: 4.21 MIL/uL — ABNORMAL LOW (ref 4.22–5.81)
RDW: 13.7 % (ref 11.5–15.5)
WBC Count: 16.1 K/uL — ABNORMAL HIGH (ref 4.0–10.5)
nRBC: 0 % (ref 0.0–0.2)

## 2024-05-12 LAB — CMP (CANCER CENTER ONLY)
ALT: 9 U/L (ref 0–44)
AST: 24 U/L (ref 15–41)
Albumin: 3.8 g/dL (ref 3.5–5.0)
Alkaline Phosphatase: 107 U/L (ref 38–126)
Anion gap: 13 (ref 5–15)
BUN: 20 mg/dL (ref 6–20)
CO2: 24 mmol/L (ref 22–32)
Calcium: 9.3 mg/dL (ref 8.9–10.3)
Chloride: 100 mmol/L (ref 98–111)
Creatinine: 0.88 mg/dL (ref 0.61–1.24)
GFR, Estimated: >60 mL/min (ref >60)
Glucose, Bld: 126 mg/dL — ABNORMAL HIGH (ref 70–99)
Potassium: 3.9 mmol/L (ref 3.5–5.1)
Sodium: 136 mmol/L (ref 135–145)
Total Bilirubin: 0.2 mg/dL (ref 0.0–1.2)
Total Protein: 7.2 g/dL (ref 6.5–8.1)

## 2024-05-12 LAB — MISCELLANEOUS TEST

## 2024-05-12 MED FILL — Morphine Sulfate Inj 4 MG/ML: INTRAMUSCULAR | Qty: 1 | Status: AC

## 2024-05-12 NOTE — Progress Notes (Signed)
 [facility] [phone]   [phone]  CONSULT NOTE  REFERRING PHYSICIAN: Dr. Dorn Chill  REASON FOR CONSULTATION:  58 years old African-American male recently diagnosed with lung cancer.  HPI Lucas Yu is a 58 y.o. male with past medical history significant for hypertension and long history of smoking.  The patient presented to the emergency department at [facility] on April 21, 2024 complaining of acute abdominal pain in addition to nausea and vomiting.  During his evaluation he was found to have heterogeneously enhancing right adrenal mass measuring 4.7 x 2.1 cm with no other acute findings except for colonic diverticulosis without evidence of diverticulitis.  He also have symptoms of suspicious stroke and CT of the head followed by MRI of the brain on May 03, 2024 showed right frontal and left parietal masses with moderate vasogenic edema.  The MRI of the brain showed peripherally enhancing lesion measuring 1.7 x 1.3 cm in the posterior right frontal lobe and 1.0 x 0.8 x 1.0 cm in the left parietal lobe with moderate associated vasogenic edema, greater in the posterior right frontal lobe with associated sulcus effacement and slight mass effect on the right greater than left lateral ventricles.  There was no midline shift.  There was also a punctate lesion in the left cerebellum most consistent with metastatic disease.  CT of the chest with contrast on May 03, 2024 showed 3.1 x 3.5 x 3.4 cm right upper lobe pulmonary mass tethered to the peripheral pleura compatible with primary bronchogenic carcinoma.  There was also necrotic right hilar and precarinal lymphadenopathy consistent with metastatic disease.  There was bilateral adrenal nodules right greater than left consistent with metastatic disease.  On May 05, 2024 the patient underwent rigid bronchoscopy with robotic assisted bronchoscopic navigation under the care of  Dr. Chill.  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 The carcinoma is positive for CKAE1/3, TTF-1, synaptophysin, and CD56. It is negative for napsin-A and p40. The cytomorphologic features and pattern of immunoreactivity support the above diagnosis.  There was insufficient material for molecular testing. The patient was referred to me today for evaluation and recommendation regarding treatment of his condition.    HPI  Discussed the use of AI scribe software for clinical note transcription with the patient, who gave verbal consent to proceed.  History of Present Illness Lucas Yu is a 58 year old male with recently diagnosed lung cancer who presents for evaluation. He is accompanied by his fiance, Ernestene, and his daughter, Benton, joins via [employer].  He was diagnosed with lung cancer following an episode on Halloween night where he experienced symptoms suggestive of a stroke, including left-sided weakness. He did not seek immediate medical attention but went to the hospital a week later, where he was admitted. Initial imaging revealed several spots in his brain with surrounding edema, prompting further investigation. A subsequent chest scan on November 11 revealed a large mass measuring 3.1 x 3.5 x 3.4 cm in the upper part of the right lung, with involvement of mediastinal lymph nodes and nodules in both adrenal glands, suspicious for cancer. A bronchoscopy and biopsy of the right lung mass confirmed non-small cell lung cancer.  He has difficulty sleeping, waking up in the middle of the night and being unable to return to sleep, which he attributes to anxiety. He is currently  taking dexamethasone. No chest pain, shortness of breath, hemoptysis, nausea, vomiting, diarrhea, and weight loss. He has a persistent cough, which  he states he has had for a long time. He also reports fatigue and weakness in his legs but notes that a recent ultrasound of his left lower extremity was negative for blood clots.  His past medical history includes hypertension, for which he started medication after the stroke-like episode. He denies a history of diabetes, heart attacks, or previous strokes.  His family history is significant for cancer, with his father, paternal grandparents, and three brothers having died from cancer, though the specific types are unknown.  Socially, he has a long history of smoking, having started at age 54, and reports smoking one pack per day. He is attempting to quit and has reduced his smoking significantly. He also has a history of alcohol use but has not consumed alcohol since December. He occasionally uses marijuana. He is allergic to penicillin.     Past Medical History:  Diagnosis Date   Hypertension       Past Surgical History:  Procedure Laterality Date   VIDEO BRONCHOSCOPY WITH ENDOBRONCHIAL NAVIGATION N/A 05/05/2024   Procedure: VIDEO BRONCHOSCOPY WITH ENDOBRONCHIAL NAVIGATION;  Surgeon: Kara Dorn NOVAK, MD;  Location: [facility] ENDOSCOPY;  Service: Pulmonary;  Laterality: N/A;   VIDEO BRONCHOSCOPY WITH ENDOBRONCHIAL ULTRASOUND Right 05/05/2024   Procedure: BRONCHOSCOPY, WITH EBUS;  Surgeon: Kara Dorn NOVAK, MD;  Location: [facility] ENDOSCOPY;  Service: Pulmonary;  Laterality: Right;    Family History  Problem Relation Age of Onset   Hypertension Father    Cancer Father     Social History Social History   Tobacco Use   Smoking status: Every Day    Current packs/day: 1.00    Average packs/day: 1 pack/day for 44.0 years (44.0 ttl pk-yrs)    Types: Cigarettes   Smokeless tobacco: Never   Tobacco comments:    4-5 cigarettes per day currently  Vaping Use   Vaping status: Never Used  Substance Use Topics   Alcohol use: Yes    Alcohol/week: 7.0 standard drinks of alcohol    Types: 7  Cans of beer per week   Drug use: Yes    Types: Marijuana    Allergies  Allergen Reactions   Penicillins     Has patient had a PCN reaction causing immediate rash, facial/tongue/throat swelling, SOB or lightheadedness with hypotension: No Has patient had a PCN reaction causing severe rash involving mucus membranes or skin necrosis: No Has patient had a PCN reaction that required hospitalization: No Has patient had a PCN reaction occurring within the last 10 years: No If all of the above answers are NO, then may proceed with Cephalosporin use.     Current Outpatient Medications  Medication Sig Dispense Refill   cetirizine (ZYRTEC) 10 MG tablet Take 10 mg by mouth daily as needed for allergies.     cyclobenzaprine  (FLEXERIL ) 10 MG tablet Take 10 mg by mouth 3 (three) times daily as needed for muscle spasms.     dexamethasone (DECADRON) 4 MG tablet Take 1 tablet (4 mg total) by mouth 3 (three) times daily. 90 tablet 0   gabapentin  (NEURONTIN ) 400 MG capsule Take 1 capsule (400 mg total) by mouth daily as needed (for pain).     LORazepam (ATIVAN) 0.5 MG tablet Take 1 tablet (0.5 mg total) by mouth 20 minutes prior to radiation treatment or MRI. Do not drive after taking medication. 2 tablet 0   nicotine (NICODERM  CQ - DOSED IN MG/24 HOURS) 14 mg/24hr patch Place 1 patch (14 mg total) onto the skin daily. Apply 21 mg patch daily x 6 wk, then 14mg  patch daily x 2 wk, then 7 mg patch daily x 2 wk 14 patch 0   nicotine (NICODERM CQ - DOSED IN MG/24 HOURS) 21 mg/24hr patch Place 1 patch (21 mg total) onto the skin daily. Apply 21 mg patch daily x 6 wk, then 14mg  patch daily x 2 wk, then 7 mg patch daily x 2 wk 14 patch 2   nicotine (NICODERM CQ - DOSED IN MG/24 HR) 7 mg/24hr patch Place 1 patch (7 mg total) onto the skin daily. 28 patch 0   nicotine (NICODERM CQ - DOSED IN MG/24 HR) 7 mg/24hr patch Place 1 patch (7 mg total) onto the skin daily. Apply 21 mg patch daily x 6 wk, then 14mg  patch  daily x 2 wk, then 7 mg patch daily x 2 wk 14 patch 0   pantoprazole (PROTONIX) 40 MG tablet Take 1 tablet (40 mg total) by mouth daily. 30 tablet 1   polyethylene glycol powder (GLYCOLAX/MIRALAX) 17 GM/SCOOP powder Take 17 g by mouth 2 (two) times daily. Dissolve 1 capful (17g) in 4-8 ounces of liquid and take by mouth daily. 238 g 0   pregabalin (LYRICA) 25 MG capsule Take 25 mg by mouth every 8 (eight) hours.     sucralfate (CARAFATE) 1 g tablet Take 1 tablet (1 g total) by mouth 4 (four) times daily for 15 days. 60 tablet 0   tadalafil (CIALIS) 20 MG tablet Take 20 mg by mouth once as needed for erectile dysfunction.     No current facility-administered medications for this visit.    Review of Systems  Constitutional: positive for fatigue Eyes: negative Ears, nose, mouth, throat, and face: negative Respiratory: positive for cough Cardiovascular: negative Gastrointestinal: negative Genitourinary:negative Integument/breast: negative Hematologic/lymphatic: negative Musculoskeletal:negative Neurological: negative Behavioral/Psych: positive for sleep disturbance Endocrine: negative Allergic/Immunologic: negative  Physical Exam  MJO:jozmu, healthy, no distress, well nourished, well developed, and anxious SKIN: skin color, texture, turgor are normal, no rashes or significant lesions HEAD: Normocephalic, No masses, lesions, tenderness or abnormalities EYES: normal, PERRLA, Conjunctiva are pink and non-injected EARS: External ears normal, Canals clear OROPHARYNX:no exudate, no erythema, and lips, buccal mucosa, and tongue normal  NECK: supple, no adenopathy, no JVD LYMPH:  no palpable lymphadenopathy, no hepatosplenomegaly LUNGS: clear to auscultation , and palpation HEART: regular rate & rhythm, no murmurs, and no gallops ABDOMEN:abdomen soft, non-tender, normal bowel sounds, and no masses or organomegaly BACK: Back symmetric, no curvature., No CVA tenderness EXTREMITIES:no joint  deformities, effusion, or inflammation, no edema  NEURO: alert & oriented x 3 with fluent speech, no focal motor/sensory deficits  PERFORMANCE STATUS: ECOG 1  LABORATORY DATA: Lab Results  Component Value Date   WBC 16.1 (H) 05/12/2024   HGB 12.9 (L) 05/12/2024   HCT 37.8 (L) 05/12/2024   MCV 89.8 05/12/2024   PLT 493 (H) 05/12/2024      Chemistry      Component Value Date/Time   NA 137 05/11/2024 0901   NA 140 11/30/2019 1058   NA 139 01/16/2014 0703   K 3.7 05/11/2024 0901   K 3.8 01/16/2014 0703   CL 99 05/11/2024 0901   CL 105 01/16/2014 0703   CO2 26 05/11/2024 0901   CO2 25 01/16/2014 0703   BUN 16 05/11/2024 0901   BUN 11 11/30/2019 1058   BUN 11  01/16/2014 0703   CREATININE 0.87 05/11/2024 0901   CREATININE 1.06 01/16/2014 0703      Component Value Date/Time   CALCIUM 9.6 05/11/2024 0901   CALCIUM 8.2 (L) 01/16/2014 0703   ALKPHOS 98 05/11/2024 0901   ALKPHOS 105 01/16/2014 0703   AST 21 05/11/2024 0901   ALT 9 05/11/2024 0901   ALT 321 (H) 01/16/2014 0703   BILITOT 0.2 05/11/2024 0901       RADIOGRAPHIC STUDIES: VAS US  LOWER EXTREMITY VENOUS (DVT) Result Date: 05/11/2024  Lower Venous DVT Study Patient Name:  FAMOUS EISENHARDT  Date of Exam:   05/11/2024 Medical Rec #: [id number]      Accession #:    [id number] Date of Birth: [birth year]      Patient Gender: M Patient Age:   86 years Exam Location:  [facility] Procedure:      VAS US  LOWER EXTREMITY VENOUS (DVT) Referring Phys: LEEROY DUE --------------------------------------------------------------------------------  Indications: Pain.  Risk Factors: Cancer. Comparison Study: No prior studies. Performing Technologist: Cordella Collet RVT  Examination Guidelines: A complete evaluation includes B-mode imaging, spectral Doppler, color Doppler, and power Doppler as needed of all accessible portions of each vessel. Bilateral testing is considered an integral part of a complete examination. Limited examinations  for reoccurring indications may be performed as noted. The reflux portion of the exam is performed with the patient in reverse Trendelenburg.  +---------+---------------+---------+-----------+----------+--------------+ RIGHT    CompressibilityPhasicitySpontaneityPropertiesThrombus Aging +---------+---------------+---------+-----------+----------+--------------+ CFV      Full           Yes      Yes                                 +---------+---------------+---------+-----------+----------+--------------+ SFJ      Full                                                        +---------+---------------+---------+-----------+----------+--------------+ FV Prox  Full                                                        +---------+---------------+---------+-----------+----------+--------------+ FV Mid   Full                                                        +---------+---------------+---------+-----------+----------+--------------+ FV DistalFull                                                        +---------+---------------+---------+-----------+----------+--------------+ PFV      Full                                                        +---------+---------------+---------+-----------+----------+--------------+  POP      Full           Yes      Yes                                 +---------+---------------+---------+-----------+----------+--------------+ PTV      Full                                                        +---------+---------------+---------+-----------+----------+--------------+ PERO     Full                                                        +---------+---------------+---------+-----------+----------+--------------+   +---------+---------------+---------+-----------+----------+--------------+ LEFT     CompressibilityPhasicitySpontaneityPropertiesThrombus Aging  +---------+---------------+---------+-----------+----------+--------------+ CFV      Full           Yes      Yes                                 +---------+---------------+---------+-----------+----------+--------------+ SFJ      Full                                                        +---------+---------------+---------+-----------+----------+--------------+ FV Prox  Full                                                        +---------+---------------+---------+-----------+----------+--------------+ FV Mid   Full                                                        +---------+---------------+---------+-----------+----------+--------------+ FV DistalFull                                                        +---------+---------------+---------+-----------+----------+--------------+ PFV      Full                                                        +---------+---------------+---------+-----------+----------+--------------+ POP      Full           Yes      Yes                                 +---------+---------------+---------+-----------+----------+--------------+  PTV      Full                                                        +---------+---------------+---------+-----------+----------+--------------+ PERO     Full                                                        +---------+---------------+---------+-----------+----------+--------------+     Summary: RIGHT: - There is no evidence of deep vein thrombosis in the lower extremity.  - No cystic structure found in the popliteal fossa.  LEFT: - There is no evidence of deep vein thrombosis in the lower extremity.  - No cystic structure found in the popliteal fossa.  *See table(s) above for measurements and observations. Electronically signed by Lonni Gaskins MD on 05/11/2024 at 3:26:26 PM.    Final    MR Brain W Wo Contrast Result Date: 05/11/2024 EXAM: MRI BRAIN WITH AND WITHOUT  CONTRAST 05/10/2024 04:46:18 PM TECHNIQUE: Multiplanar multisequence MRI of the head/brain was performed with and without the administration of intravenous contrast. CONTRAST: 7 mL of Gadavist. COMPARISON: MR Head 05/03/2024. CLINICAL HISTORY: 58 year old male with brain metastases, pretreatment planning. FINDINGS: BRAIN AND VENTRICLES: Subcentimeter left superior cerebellar enhancing metastasis measures 3 to 4 mm on series 1100 image 124. Left superior occipital versus inferior parietal lobe thick rim enhancing metastasis is 12 mm on series 1100 image 172. More solid round and mildly lobulated enhancing metastasis in the posterior right frontal lobe is 18 mm on series 1100 image 198. Punctate enhancing metastasis in the anterior left frontal lobe is visible on both series 1100 image 204 and series 9 image 15. No other brain metastases identified. Vasogenic edema in the posterior right frontal lobe and the posterior left hemisphere is stable. There is no significant midline shift or intracranial mass effect. Normal underlying brain volume. No convincing intracranial hemorrhage. No acute infarct. No hydrocephalus. The sella is unremarkable. Normal flow voids. No dural thickening identified. Negative visible cervical spine and spinal cord. ORBITS: No acute abnormality. SINUSES: No acute abnormality. BONES AND SOFT TISSUES: Visible bone marrow signal is within normal limits. Negative visible cervical spine. No acute soft tissue abnormality. IMPRESSION: 1. Total of four enhancing brain metastases on 3T ranging from punctate (anterior left frontal) to 18 mm (posterior right frontal), all annotated on series 1100. 2. Stable vasogenic edema in the posterior right frontal lobe and posterior left hemisphere. No significant intracranial mass effect. Electronically signed by: Helayne Hurst MD 05/11/2024 09:48 AM EST RP Workstation: [id number]   DG CHEST PORT 1 VIEW Result Date: 05/05/2024 CLINICAL DATA:  Post bronchoscopy  with biopsy. EXAM: PORTABLE CHEST 1 VIEW COMPARISON:  05/03/2024 and CT 05/03/2024 FINDINGS: Lungs are hypoinflated and demonstrate evidence of patient's known right upper lobe nodule which is slightly less well-defined compatible recent bronchoscopy and biopsy. No pneumothorax. Remainder of the lungs are clear. Cardiomediastinal silhouette and remainder of the exam is unchanged. IMPRESSION: Hypoinflation with known right upper lobe nodule. No pneumothorax. Electronically Signed   By: Toribio Agreste M.D.   On: 05/05/2024 13:30   DG C-ARM BRONCHOSCOPY Result Date: 05/05/2024 C-ARM BRONCHOSCOPY: Fluoroscopy was utilized by the  requesting physician.  No radiographic interpretation.   MR ABDOMEN W WO CONTRAST Result Date: 05/04/2024 EXAM: MRCP WITH AND WITHOUT IV CONTRAST 05/04/2024 08:24:35 AM TECHNIQUE: Multisequence, multiplanar magnetic resonance images of the abdomen with and without intravenous contrast. 7 mL gadobutrol  (GADAVIST ) 1 MMOL/ML injection was administered. MRCP sequences were performed. COMPARISON: Chest CT 05/03/2024. CLINICAL HISTORY: Lung mass. FINDINGS: LIVER: No enhancing lesion within the liver to suggest a hepatocellular carcinoma. No enhancing lesion to suggest metastatic disease. GALLBLADDER AND BILIARY SYSTEM: Gallbladder is unremarkable. No intrahepatic or extrahepatic ductal dilation. SPLEEN: Unremarkable. PANCREAS/PANCREATIC DUCT: Visualized pancreas is unremarkable. No pancreatic ductal dilatation. ADRENAL GLANDS: Masslike enlargement of the right adrenal gland to 3.6 x 2.3 cm on image 34 of series 802. This right adrenal mass has a suspicious enhancement pattern for metastatic disease. Similar findings in the left adrenal gland which is enlarged to 2.1 x 1.6 cm. KIDNEYS: Unremarkable. LYMPH NODES: No enlarged abdominal lymph nodes. VASCULATURE: Unremarkable. PERITONEUM: No ascites. ABDOMINAL WALL: No hernia. No mass. BOWEL: Grossly unremarkable. No bowel obstruction. BONES: No  acute abnormality or worrisome osseous lesion. SOFT TISSUES: Unremarkable. MISCELLANEOUS: Unremarkable. IMPRESSION: 1. No evidence of hepatocellular carcinoma or metastatic disease in the liver. 2. 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 Electronically signed by: Norleen Boxer MD 05/04/2024 09:30 AM EST RP Workstation: [id number]   CT CHEST W CONTRAST Result Date: 05/04/2024 CLINICAL DATA:  Pulmonary lesion on chest x-ray concerning for neoplasm. * Tracking Code: BO * EXAM: CT CHEST WITH CONTRAST TECHNIQUE: Multidetector CT imaging of the chest was performed during intravenous contrast administration. RADIATION DOSE REDUCTION: This exam was performed according to the departmental dose-optimization program which includes automated exposure control, adjustment of the mA and/or kV according to patient size and/or use of iterative reconstruction technique. CONTRAST:  50mL OMNIPAQUE  IOHEXOL  350 MG/ML SOLN COMPARISON:  Chest x-ray earlier same day.  Chest CT 04/04/2020 FINDINGS: Cardiovascular: The heart size is normal. No substantial pericardial effusion. No thoracic aortic aneurysm. Mediastinum/Nodes: 1.7 cm short axis necrotic precarinal lymph node evident. 1.8 cm short axis necrotic right hilar lymph node. No left hilar lymphadenopathy. The esophagus has normal imaging features. There is no axillary lymphadenopathy. Lungs/Pleura: 3.1 x 3.5 x 3.4 cm right upper lobe pulmonary mass is tethered to the peripheral pleura. No other suspicious pulmonary nodule or mass. No pulmonary edema or pleural effusion. Centrilobular and paraseptal emphysema evident. Upper Abdomen: 3.7 x 2.8 x 6.7 cm heterogeneously enhancing right adrenal mass evident. 1.9 x 1.8 x 1.8 cm left adrenal nodule. Musculoskeletal: No worrisome lytic or sclerotic osseous abnormality. IMPRESSION: 1. 3.1 x 3.5 x 3.4 cm right upper lobe pulmonary mass is tethered to the peripheral pleura. Imaging features are compatible with  primary bronchogenic neoplasm. 2. Necrotic right hilar and precarinal lymphadenopathy consistent with metastatic disease. 3. Bilateral adrenal nodules, right larger than left, consistent with metastatic disease. 4.  Emphysema (ICD10-J43.9). Electronically Signed   By: Camellia Candle M.D.   On: 05/04/2024 05:39   MR Brain W and Wo Contrast Result Date: 05/03/2024 EXAM: MRI BRAIN WITH AND WITHOUT CONTRAST 05/03/2024 02:05:09 PM TECHNIQUE: Multiplanar multisequence MRI of the head/brain was performed with and without the administration of intravenous contrast. 7 mL (gadobutrol  (GADAVIST ) 1 MMOL/ML injection 7 mL GADOBUTROL  1 MMOL/ML IV SOLN). COMPARISON: CT head 05/03/2024. CLINICAL HISTORY: Brain/CNS neoplasm, staging; Neuro deficit, acute, stroke suspected. Slurred speech, facial droop, dysphagia, and left sided numbness/weakness. FINDINGS: LIMITATIONS: The examination is intermittently up to moderately motion degraded. BRAIN AND VENTRICLES: Peripherally  enhancing lesions measure 1.7 x 1.3 cm in the posterior right frontal lobe and 1.0 x 0.8 x 1.0 cm in the left parietal lobe. There is moderate associated vasogenic edema, greater in the posterior right frontal lobe, with associated sulcal effacement and slight mass effect on the right greater than left lateral ventricles. There is no midline shift. A punctate focus of susceptibility associated with the right frontal lesion may reflect minimal blood products or mineralization. There is also a punctate 1.5-2 mm enhancing lesion in the superior left cerebellar hemisphere (series 18 image 48 and series 20 image 15) without edema. There is no restricted diffusion associated with these lesions. No acute infarct, hydrocephalus, or extra axial fluid collection is evident. Cerebral volume is normal. Major intracranial vascular flow voids are preserved. ORBITS: No acute abnormality. SINUSES: No acute abnormality. BONES AND SOFT TISSUES: Normal bone marrow signal and  enhancement. Small right frontal scalp lipoma. IMPRESSION: 1. Enhancing lesions with edema in the right frontal and left parietal lobes as well as a punctate lesion in the left cerebellum, most consistent with metastatic disease. Electronically signed by: Dasie Hamburg MD 05/03/2024 03:24 PM EST RP Workstation: [id number]   DG Chest Port 1 View Result Date: 05/03/2024 EXAM: 1 VIEW(S) XRAY OF THE CHEST 05/03/2024 02:15:00 PM COMPARISON: Chest CT 04/04/2020. CLINICAL HISTORY: Brain lesions, slurred speech, weakness, and dysphasia. Possible metastases in brain. FINDINGS: LUNGS AND PLEURA: 3.2 cm right upper lobe mass. No pulmonary edema. No pleural effusion. No pneumothorax. HEART AND MEDIASTINUM: No acute abnormality of the cardiac and mediastinal silhouettes. BONES AND SOFT TISSUES: No acute osseous abnormality. IMPRESSION: 1. Right upper lobe pulmonary mass measuring 3.2 cm, suspicious for primary lung neoplasm; recommend dedicated chest CT for further characterization and staging evaluation, and oncologic referral. Electronically signed by: Maude Stammer MD 05/03/2024 02:29 PM EST RP Workstation: [id number]   CT HEAD WO CONTRAST Result Date: 05/03/2024 EXAM: CT HEAD WITHOUT CONTRAST 05/03/2024 12:06:07 PM TECHNIQUE: CT of the head was performed without the administration of intravenous contrast. Automated exposure control, iterative reconstruction, and/or weight based adjustment of the mA/kV was utilized to reduce the radiation dose to as low as reasonably achievable. COMPARISON: None available. CLINICAL HISTORY: Neuro deficit, acute, stroke suspected. FINDINGS: BRAIN AND VENTRICLES: Hyperdense masses in the posterior right frontal lobe and left parietal lobe measure 1.3 cm and 1.0 cm, respectively. Associated moderate vasogenic edema is greater in the right frontal lobe. There is mild regional mass effect without significant midline shift. Elsewhere, no acute infarct, intracranial hemorrhage,  hydrocephalus, or extra-axial fluid collection is identified. Cerebral volume is normal. ORBITS: No acute abnormality. SINUSES: No acute abnormality. SOFT TISSUES AND SKULL: No acute soft tissue abnormality. No skull fracture. IMPRESSION: 1. Right frontal and left parietal masses with moderate vasogenic edema. A brain MRI without and with IV contrast is recommended for further evaluation. Electronically signed by: Dasie Hamburg MD 05/03/2024 12:21 PM EST RP Workstation: [id number]   CT ABDOMEN PELVIS W CONTRAST Result Date: 04/21/2024 CLINICAL DATA:  Acute abdominal pain.  Nausea and vomiting. EXAM: CT ABDOMEN AND PELVIS WITH CONTRAST TECHNIQUE: Multidetector CT imaging of the abdomen and pelvis was performed using the standard protocol following bolus administration of intravenous contrast. RADIATION DOSE REDUCTION: This exam was performed according to the departmental dose-optimization program which includes automated exposure control, adjustment of the mA and/or kV according to patient size and/or use of iterative reconstruction technique. CONTRAST:  100mL OMNIPAQUE IOHEXOL 300 MG/ML  SOLN COMPARISON:  None Available. FINDINGS: Lower Chest: No  acute findings. Hepatobiliary:  No suspicious hepatic masses identified. Pancreas: Focal area of hyperenhancement is seen in segment 2 of the left hepatic lobe which is peripheral and somewhat wedge-shaped in appearance. This is suggestive of a vascular shunt, with hepatic mass considered less likely. Gallbladder is unremarkable. No evidence of biliary ductal dilatation. Spleen: Within normal limits in size and appearance. Adrenals/Urinary Tract: Heterogeneously enhancing right adrenal mass is seen measuring 4.7 x 2.1 cm. Normal left adrenal gland and kidneys. No evidence of ureteral calculi or hydronephrosis. Unremarkable unopacified urinary bladder. Stomach/Bowel: No evidence of obstruction, inflammatory process or abnormal fluid collections. Colonic diverticulosis  noted, without signs of diverticulitis. Vascular/Lymphatic: No pathologically enlarged lymph nodes. No acute vascular findings. Reproductive:  No mass or other significant abnormality. Other:  None. Musculoskeletal:  No suspicious bone lesions identified. IMPRESSION: No acute findings. Colonic diverticulosis, without radiographic evidence of diverticulitis. Indeterminate right adrenal mass. Recommend abdomen MRI without and with contrast for further characterization. Focal area of hyperenhancement in left hepatic lobe, which is suggestive of a vascular shunt, with hepatic mass considered less likely. This can also be further characterized by MRI. Electronically Signed   By: Norleen DELENA Kil M.D.   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 There was insufficient material for molecular studies.   PLAN: I had a lengthy discussion with the patient and his family today about his current disease stage, prognosis and treatment options.  I personally independently reviewed the imaging studies, pathology report and discussed the results with the patient and his family.  The patient understands that he has incurable condition and all the treatment will be of palliative nature with the goal of prolongation of his life and palliation of his symptoms. Assessment and Plan Assessment & Plan Stage 4 non-small cell lung cancer with right upper lobe lung mass, right hilar mediastinal lymphadenopathy, brain and bilateral adrenal metastases diagnosed in November 2025. Stage 4 non-small cell lung cancer with metastases to the brain and bilateral adrenal  glands. The primary tumor is located in the right upper lung with hilar and mediastinal lymph node involvement. The cancer has neuroendocrine features, which may indicate a more aggressive course. Surgery is not an option due to the extent of metastasis. Radiation therapy to the brain is planned to address brain metastases. Molecular marker testing is pending to determine eligibility for targeted therapy. If molecular markers are present, targeted therapy may be initiated; otherwise, chemotherapy and immunotherapy will be considered. The goal is to extend life expectancy and improve quality of life, acknowledging that stage 4 lung cancer is not curable. - Ordered radiation therapy to the brain. - Ordered molecular marker testing (EGFR, ALK, HER2, KRAS, and others) via blood test. - Ordered PET scan to assess for additional metastases. - If molecular markers are present, will initiate targeted therapy. - If molecular markers are absent, will initiate chemotherapy and immunotherapy.  Hypertension Recent increase in blood pressure following stroke and cancer diagnosis. Currently not on antihypertensive medication. - Continue to monitor blood pressure regularly.  Insomnia and anxiety Likely exacerbated by recent cancer diagnosis and steroid use. Steroid use may contribute to hyperactivity and sleep disturbances. - Advised  taking dexamethasone  at 4-5 PM to minimize sleep disturbances. - Recommended Tylenol  PM for sleep if needed.     The patient voices understanding of current disease status and treatment options and is in agreement with the current care plan.  All questions were answered. The patient knows to call the clinic with any problems, questions or concerns. We can certainly see the patient much sooner if necessary.  Thank you so much for allowing me to participate in the care of Lucas Yu. I will continue to follow up the patient with you and assist in his care.  The total time spent  in the appointment was 90 minutes including review of chart and various tests results, discussions about plan of care and coordination of care plan .   Disclaimer: This note was dictated with voice recognition software. Similar sounding words can inadvertently be transcribed and may not be corrected upon review.   Sherrod MARLA Sherrod May 12, 2024, 1:56 PM

## 2024-05-13 ENCOUNTER — Other Ambulatory Visit (HOSPITAL_COMMUNITY): Payer: Self-pay

## 2024-05-13 DIAGNOSIS — Z510 Encounter for antineoplastic radiation therapy: Secondary | ICD-10-CM | POA: Diagnosis not present

## 2024-05-16 ENCOUNTER — Other Ambulatory Visit: Payer: Self-pay | Admitting: Radiology

## 2024-05-16 ENCOUNTER — Ambulatory Visit: Admission: RE | Admit: 2024-05-16 | Source: Ambulatory Visit | Admitting: Radiation Oncology

## 2024-05-16 ENCOUNTER — Ambulatory Visit: Admission: RE | Admit: 2024-05-16 | Source: Ambulatory Visit

## 2024-05-16 DIAGNOSIS — C7931 Secondary malignant neoplasm of brain: Secondary | ICD-10-CM

## 2024-05-16 MED ORDER — LORAZEPAM 0.5 MG PO TABS: ORAL_TABLET | ORAL | 0 refills | Status: DC

## 2024-05-17 ENCOUNTER — Ambulatory Visit

## 2024-05-17 ENCOUNTER — Encounter: Payer: Self-pay | Admitting: Radiation Oncology

## 2024-05-17 ENCOUNTER — Ambulatory Visit
Admission: RE | Admit: 2024-05-17 | Discharge: 2024-05-17 | Disposition: A | Discharge status: Home / Self Care | Source: Ambulatory Visit | Attending: Radiation Oncology | Admitting: Radiation Oncology

## 2024-05-17 ENCOUNTER — Ambulatory Visit: Admitting: Radiation Oncology

## 2024-05-17 ENCOUNTER — Other Ambulatory Visit: Payer: Self-pay

## 2024-05-17 VITALS — BP 122/88 | HR 80 | Temp 97.6°F | Resp 20 | Ht 67.0 in

## 2024-05-17 DIAGNOSIS — C3490 Malignant neoplasm of unspecified part of unspecified bronchus or lung: Secondary | ICD-10-CM | POA: Insufficient documentation

## 2024-05-17 DIAGNOSIS — C7931 Secondary malignant neoplasm of brain: Secondary | ICD-10-CM | POA: Insufficient documentation

## 2024-05-17 DIAGNOSIS — M25552 Pain in left hip: Secondary | ICD-10-CM

## 2024-05-17 LAB — RAD ONC ARIA SESSION SUMMARY
Course Elapsed Days: 0
Plan Fractions Treated to Date: 1
Plan Prescribed Dose Per Fraction: 20 Gy
Plan Total Fractions Prescribed: 1
Plan Total Prescribed Dose: 20 Gy
Reference Point Dosage Given to Date: 20 Gy
Reference Point Session Dosage Given: 20 Gy
Session Number: 1

## 2024-05-17 MED ORDER — LORAZEPAM 1 MG PO TABS
0.5000 mg | ORAL_TABLET | Freq: Once | ORAL | Status: AC
  Administered 2024-05-17: 0.5 mg via ORAL

## 2024-05-17 MED ORDER — FLUCONAZOLE 100 MG PO TABS: ORAL_TABLET | ORAL | 0 refills | Status: DC

## 2024-05-17 NOTE — Progress Notes (Signed)
 I met with Lucas Yu and his family after his SRS to his brain today.  He tolerated procedure well without acute complications.  He is in good spirits.  He denies any headaches or new neurologic symptoms.  No dizziness.  On physical exam he has thrush over the palate.   I gave him a prescription today for fluconazole  for his thrush.  I also wrote down a taper plan for his dexamethasone , see below.  Patient will be scheduled for follow-up in radiation oncology in 1 month    -----------------------------------  Lauraine Golden, MD

## 2024-05-17 NOTE — Progress Notes (Signed)
 Nurse monitoring complete status post SRS treatments. Patient without complaints. Patient denies new or worsening neurologic symptoms. Vitals stable. Instructed patient to avoid strenuous activity for the next 24 hours. (Instructed patient to not miss any of her decadron  doses). Instructed patient to call [phone] with needs related to treatment after hours or over the weekend. Patient verbalized understanding   Lucas Yu rested with us  for 30 minutes following SRS treatment.  Patient denies headache, dizziness, nausea, diplopia or ringing in the ears. Denies fatigue. Patient without complaints. Understands to avoid strenuous activity for the next 24 hours and call [phone] with needs   BP 122/88 (BP Location: Left Arm, Patient Position: Sitting, Cuff Size: Large)   Pulse 80   Temp 97.6 F (36.4 C)   Resp 20   Ht 5' 7 (1.702 m)   SpO2 100%   BMI 24.12 kg/m   Wt Readings from Last 3 Encounters:  05/12/24 154 lb (69.9 kg)  05/11/24 152 lb 9.6 oz (69.2 kg)  05/05/24 158 lb 1.1 oz (71.7 kg)

## 2024-05-18 ENCOUNTER — Encounter (HOSPITAL_COMMUNITY): Payer: Self-pay | Admitting: Family Medicine

## 2024-05-18 ENCOUNTER — Emergency Department

## 2024-05-18 ENCOUNTER — Emergency Department
Admission: EM | Admit: 2024-05-18 | Discharge: 2024-05-18 | Disposition: A | Discharge status: Other Acute Inpatient Hospital

## 2024-05-18 ENCOUNTER — Telehealth: Payer: Self-pay

## 2024-05-18 ENCOUNTER — Inpatient Hospital Stay (HOSPITAL_COMMUNITY)
Admission: EM | Admit: 2024-05-18 | Discharge: 2024-05-20 | DRG: 100 | Disposition: A | Discharge status: Home / Self Care | Attending: Student | Admitting: Student

## 2024-05-18 ENCOUNTER — Other Ambulatory Visit: Payer: Self-pay | Admitting: Physician Assistant

## 2024-05-18 DIAGNOSIS — C3411 Malignant neoplasm of upper lobe, right bronchus or lung: Secondary | ICD-10-CM | POA: Diagnosis present

## 2024-05-18 DIAGNOSIS — Z85118 Personal history of other malignant neoplasm of bronchus and lung: Secondary | ICD-10-CM | POA: Insufficient documentation

## 2024-05-18 DIAGNOSIS — R29810 Facial weakness: Secondary | ICD-10-CM | POA: Diagnosis not present

## 2024-05-18 DIAGNOSIS — R739 Hyperglycemia, unspecified: Secondary | ICD-10-CM

## 2024-05-18 DIAGNOSIS — I672 Cerebral atherosclerosis: Secondary | ICD-10-CM | POA: Insufficient documentation

## 2024-05-18 DIAGNOSIS — R4701 Aphasia: Secondary | ICD-10-CM | POA: Insufficient documentation

## 2024-05-18 DIAGNOSIS — C7971 Secondary malignant neoplasm of right adrenal gland: Secondary | ICD-10-CM | POA: Diagnosis present

## 2024-05-18 DIAGNOSIS — R451 Restlessness and agitation: Secondary | ICD-10-CM | POA: Diagnosis not present

## 2024-05-18 DIAGNOSIS — R569 Unspecified convulsions: Secondary | ICD-10-CM | POA: Insufficient documentation

## 2024-05-18 DIAGNOSIS — C7931 Secondary malignant neoplasm of brain: Secondary | ICD-10-CM | POA: Diagnosis present

## 2024-05-18 DIAGNOSIS — C3490 Malignant neoplasm of unspecified part of unspecified bronchus or lung: Secondary | ICD-10-CM | POA: Diagnosis present

## 2024-05-18 DIAGNOSIS — G40101 Localization-related (focal) (partial) symptomatic epilepsy and epileptic syndromes with simple partial seizures, not intractable, with status epilepticus: Principal | ICD-10-CM | POA: Diagnosis present

## 2024-05-18 DIAGNOSIS — D496 Neoplasm of unspecified behavior of brain: Secondary | ICD-10-CM | POA: Diagnosis not present

## 2024-05-18 DIAGNOSIS — G40901 Epilepsy, unspecified, not intractable, with status epilepticus: Secondary | ICD-10-CM | POA: Diagnosis not present

## 2024-05-18 DIAGNOSIS — G936 Cerebral edema: Secondary | ICD-10-CM | POA: Insufficient documentation

## 2024-05-18 DIAGNOSIS — R253 Fasciculation: Secondary | ICD-10-CM | POA: Insufficient documentation

## 2024-05-18 DIAGNOSIS — Z79899 Other long term (current) drug therapy: Secondary | ICD-10-CM

## 2024-05-18 DIAGNOSIS — Z8249 Family history of ischemic heart disease and other diseases of the circulatory system: Secondary | ICD-10-CM

## 2024-05-18 DIAGNOSIS — Z781 Physical restraint status: Secondary | ICD-10-CM

## 2024-05-18 DIAGNOSIS — C7972 Secondary malignant neoplasm of left adrenal gland: Secondary | ICD-10-CM | POA: Diagnosis present

## 2024-05-18 DIAGNOSIS — F17210 Nicotine dependence, cigarettes, uncomplicated: Secondary | ICD-10-CM | POA: Diagnosis present

## 2024-05-18 DIAGNOSIS — Z880 Allergy status to penicillin: Secondary | ICD-10-CM

## 2024-05-18 DIAGNOSIS — I10 Essential (primary) hypertension: Secondary | ICD-10-CM | POA: Diagnosis present

## 2024-05-18 HISTORY — DX: Benign neoplasm of brain, unspecified: D33.2

## 2024-05-18 LAB — POCT I-STAT, CHEM 8
BUN: 24 mg/dL — ABNORMAL HIGH (ref 6–20)
Calcium, Ion: 1.17 mmol/L (ref 1.15–1.40)
Chloride: 99 mmol/L (ref 98–111)
Creatinine, Ser: 1 mg/dL (ref 0.61–1.24)
Glucose, Bld: 190 mg/dL — ABNORMAL HIGH (ref 70–99)
HCT: 45 % (ref 39.0–52.0)
Hemoglobin: 15.3 g/dL (ref 13.0–17.0)
Potassium: 4.4 mmol/L (ref 3.5–5.1)
Sodium: 135 mmol/L (ref 135–145)
TCO2: 27 mmol/L (ref 22–32)

## 2024-05-18 LAB — CBC
HCT: 42.2 % (ref 39.0–52.0)
Hemoglobin: 13.7 g/dL (ref 13.0–17.0)
MCH: 30.1 pg (ref 26.0–34.0)
MCHC: 32.5 g/dL (ref 30.0–36.0)
MCV: 92.7 fL (ref 80.0–100.0)
Platelets: 421 K/uL — ABNORMAL HIGH (ref 150–400)
RBC: 4.55 MIL/uL (ref 4.22–5.81)
RDW: 14.2 % (ref 11.5–15.5)
WBC: 18.8 K/uL — ABNORMAL HIGH (ref 4.0–10.5)
nRBC: 0 % (ref 0.0–0.2)

## 2024-05-18 LAB — PROTIME-INR
INR: 1 (ref 0.8–1.2)
Prothrombin Time: 13.6 s (ref 11.4–15.2)

## 2024-05-18 LAB — DIFFERENTIAL
Abs Immature Granulocytes: 0.25 K/uL — ABNORMAL HIGH (ref 0.00–0.07)
Basophils Absolute: 0 K/uL (ref 0.0–0.1)
Basophils Relative: 0 %
Eosinophils Absolute: 0 K/uL (ref 0.0–0.5)
Eosinophils Relative: 0 %
Immature Granulocytes: 1 %
Lymphocytes Relative: 14 %
Lymphs Abs: 2.7 K/uL (ref 0.7–4.0)
Monocytes Absolute: 1.9 K/uL — ABNORMAL HIGH (ref 0.1–1.0)
Monocytes Relative: 10 %
Neutro Abs: 13.9 K/uL — ABNORMAL HIGH (ref 1.7–7.7)
Neutrophils Relative %: 75 %

## 2024-05-18 LAB — APTT: aPTT: <22 s — ABNORMAL LOW (ref 24–36)

## 2024-05-18 LAB — CBG MONITORING, ED: Glucose-Capillary: 121 mg/dL — ABNORMAL HIGH (ref 70–99)

## 2024-05-18 MED ORDER — LORAZEPAM 2 MG/ML IJ SOLN
INTRAMUSCULAR | Status: AC
  Administered 2024-05-18: 2 mg via INTRAVENOUS
  Filled 2024-05-18: qty 1

## 2024-05-18 MED ORDER — ACETAMINOPHEN 650 MG RE SUPP: 650.0000 mg | Freq: Four times a day (QID) | RECTAL | Status: DC | PRN

## 2024-05-18 MED ORDER — ONDANSETRON HCL 4 MG/2ML IJ SOLN: 4.0000 mg | Freq: Four times a day (QID) | INTRAMUSCULAR | Status: DC | PRN

## 2024-05-18 MED ORDER — PHENYTOIN SODIUM 50 MG/ML IJ SOLN
100.0000 mg | Freq: Three times a day (TID) | INTRAMUSCULAR | Status: DC
  Administered 2024-05-19 – 2024-05-20 (×4): 100 mg via INTRAVENOUS
  Filled 2024-05-18 (×5): qty 2

## 2024-05-18 MED ORDER — DEXAMETHASONE SODIUM PHOSPHATE 4 MG/ML IJ SOLN
4.0000 mg | Freq: Three times a day (TID) | INTRAMUSCULAR | Status: DC
  Administered 2024-05-19 – 2024-05-20 (×5): 4 mg via INTRAVENOUS
  Filled 2024-05-18 (×5): qty 1

## 2024-05-18 MED ORDER — LEVETIRACETAM (KEPPRA) 500 MG/5 ML ADULT IV PUSH
60.0000 mg/kg | Freq: Once | INTRAVENOUS | Status: AC
  Administered 2024-05-18: 4250 mg via INTRAVENOUS
  Filled 2024-05-18: qty 45

## 2024-05-18 MED ORDER — LEVETIRACETAM (KEPPRA) 500 MG/5 ML ADULT IV PUSH
1500.0000 mg | Freq: Two times a day (BID) | INTRAVENOUS | Status: DC
  Administered 2024-05-19 – 2024-05-20 (×3): 1500 mg via INTRAVENOUS
  Filled 2024-05-18 (×3): qty 15

## 2024-05-18 MED ORDER — SODIUM CHLORIDE 0.9 % IV SOLN: INTRAVENOUS | Status: DC

## 2024-05-18 MED ORDER — LORAZEPAM 2 MG/ML IJ SOLN
2.0000 mg | Freq: Once | INTRAMUSCULAR | Status: AC
  Administered 2024-05-18: 2 mg via INTRAVENOUS

## 2024-05-18 MED ORDER — LORAZEPAM 2 MG/ML IJ SOLN
INTRAMUSCULAR | Status: AC
  Filled 2024-05-18: qty 1

## 2024-05-18 MED ORDER — SODIUM CHLORIDE 0.9 % IV SOLN
20.0000 mg/kg | Freq: Once | INTRAVENOUS | Status: AC
  Administered 2024-05-18: 1398 mg via INTRAVENOUS
  Filled 2024-05-18: qty 27.96

## 2024-05-18 MED ORDER — ACETAMINOPHEN 325 MG PO TABS
650.0000 mg | ORAL_TABLET | Freq: Four times a day (QID) | ORAL | Status: DC | PRN
  Administered 2024-05-20: 650 mg via ORAL
  Filled 2024-05-18: qty 2

## 2024-05-18 MED ORDER — DEXAMETHASONE SOD PHOSPHATE PF 10 MG/ML IJ SOLN
10.0000 mg | Freq: Once | INTRAMUSCULAR | Status: AC
  Administered 2024-05-18: 10 mg via INTRAVENOUS

## 2024-05-18 MED ORDER — SODIUM CHLORIDE 0.9% FLUSH
3.0000 mL | Freq: Two times a day (BID) | INTRAVENOUS | Status: DC
  Administered 2024-05-19 – 2024-05-20 (×4): 3 mL via INTRAVENOUS

## 2024-05-18 MED ORDER — SODIUM CHLORIDE 0.9 % IV SOLN: 100.0000 mg | Freq: Three times a day (TID) | INTRAVENOUS | Status: DC

## 2024-05-18 MED ORDER — LORAZEPAM 0.5 MG PO TABS: ORAL_TABLET | ORAL | 0 refills | Status: DC

## 2024-05-18 MED ORDER — LORAZEPAM 2 MG/ML IJ SOLN: 2.0000 mg | INTRAMUSCULAR | Status: AC

## 2024-05-18 MED ORDER — ENOXAPARIN SODIUM 40 MG/0.4ML IJ SOSY
40.0000 mg | PREFILLED_SYRINGE | Freq: Every day | INTRAMUSCULAR | Status: DC
  Administered 2024-05-19 – 2024-05-20 (×2): 40 mg via SUBCUTANEOUS
  Filled 2024-05-18 (×2): qty 0.4

## 2024-05-18 MED ORDER — ONDANSETRON HCL 4 MG PO TABS
4.0000 mg | ORAL_TABLET | Freq: Four times a day (QID) | ORAL | Status: DC | PRN
Start: 2024-05-18 — End: 2024-05-20

## 2024-05-18 NOTE — Progress Notes (Addendum)
   05/18/24 1915  Spiritual Encounters  Type of Visit Attempt (pt unavailable)  Care provided to: Pt not available  Reason for visit Code  OnCall Visit Yes   Chaplain visited patient's room in ED15 but door was closed and staff said she didn't notice any family in the room.  Chaplain is available as needed/requested by patient and/or staff.  Rev. Rana M. Nicholaus, M.Div. Chaplain Resident [facility]

## 2024-05-18 NOTE — ED Notes (Signed)
 Verbal consent obtained from patient at this time. Patient unable to physically sign, but nodded in response to the neurologist after being told he would be transferred to [facility]. Katrinka, Consulting Civil Engineer aware. This RN was a witness at this time.

## 2024-05-18 NOTE — ED Notes (Signed)
 Patient's fiance called and informed of patient's plan of care.

## 2024-05-18 NOTE — Plan of Care (Signed)
 Patient was seen in the ED. Does not seem to be clinically seizing but is very obtunded I would imagine that that is secondary to the antiepileptics that he has received. Continue recommendations per Dr. Luis consultation Neurology will continue to follow  Eligio Lav, MD Neurology [facility]

## 2024-05-18 NOTE — Radiation Completion Notes (Signed)
 Patient Name: [name] [name] MRN: [id number] Date of Birth: 05/17/1966 Referring Physician: [facility], M.D. Date of Service: 2024-05-18 Radiation Oncologist: Lauraine Golden, M.D.  [facility] [place]                             RADIATION ONCOLOGY END OF TREATMENT NOTE     Diagnosis: C79.31 Secondary malignant neoplasm of brain Intent: Palliative     ==========DELIVERED PLANS==========  First Treatment Date: 2024-05-17 Last Treatment Date: 2024-05-17   Plan Name: Brain_SRS Site: Brain Technique: SBRT/SRT-IMRT Mode: Photon Dose Per Fraction: 20 Gy Prescribed Dose (Delivered / Prescribed): 20 Gy / 20 Gy Prescribed Fxs (Delivered / Prescribed): 1 / 1     ==========ON TREATMENT VISIT DATES========== 2024-05-17, 2024-05-17     ==========UPCOMING VISITS========== 06/14/2024 [facility]-RADIATION ONC FOLLOW UP 30 [name] [name], [place]  06/09/2024 [facility]MED ONCOLOGY LAB [facility]-ONC LAB  06/09/2024 [facility]-MED ONCOLOGY EST PT 15 [name] Sherrod, [place]  06/03/2024 [facility] MEDICINE NM PET WB & SB TO MID THIGH [facility]-NM PET CT 1  05/18/2024 [facility]-CT IMAGING CT HEAD CODE STROKE W/O CM [facility]-CT 2        ==========APPENDIX - ON TREATMENT VISIT NOTES==========   See weekly On Treatment Notes in Epic for details in the Media tab (listed as Progress notes on the On Treatment Visit Dates listed above).

## 2024-05-18 NOTE — Consult Note (Addendum)
 [facility] Provider: Consult Participants: Patient, nursing Location of the provider: [place], [place] Location of the patient: [facility]  This consult was provided via telemedicine with 2-way video and audio communication. The patient/family was informed that care would be provided in this way and agreed to receive care in this manner.    Chief Complaint: Seizures  HPI: 58 yo M with recently diagnosed brain tumor(NSCLC met) who presents with focal status epilepticus. He was in his normal state of health prior to 6:40 pm when he began having twitching. A code stroke was paged en route and on arrival, it was apparent he was twitching.  A head CT was done to rule out ICH which only revealed his stable mets. Ativan  reduced the amplitude of the twitching, but he continued to twitch. He was given additional 2mg (total of 4mg ) of ativan  and loaded with keppra  60mg /kg(4.25 gm).       LKW: 6:40 pm  tnk given?: No, tumors IR Thrombectomy? No, seizures ongoing Time of teleneurologist evaluation: 19:13  Exam: There were no vitals filed for this visit.  General: in bed, NAD  1A: Level of Consciousness - 0 1B: Ask Month and Age - 0 1C: 'Blink Eyes' & 'Squeeze Hands' - 0 2: Test Horizontal Extraocular Movements - 1 3: Test Visual Fields - 0 4: Test Facial Palsy - 2 5A: Test Left Arm Motor Drift - 0 5B: Test Right Arm Motor Drift - 0 6A: Test Left Leg Motor Drift - 0 6B: Test Right Leg Motor Drift - 0 7: Test Limb Ataxia - 0 8: Test Sensation - 1 9: Test Language/Aphasia- 2 10: Test Dysarthria - 2 11: Test Extinction/Inattention - 0 NIHSS score: 7   Imaging Reviewed: CT head- negative.   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 He had preserved conciousness prior to initiation of anti-epileptic therapy so  I would be hesitant to proceed to burst suppression. With the holiday, I am not sure if we can get EEG at [facility] tomorrow, I would favor transfer so that his status epilepticus can be treated and LTM EEG can be done if needed.    Recommendations:  1) Ativan  4mg (already given) 2) keppra  60 mg/kg (already given), then 1500mg  BID 3) decadron  10mg  VI x 1(on 4mg  TID at home per rad onc) 4) fosphenytoin  20 PE/kg, then 100mg  TID 5) would transfer in case LTM EEG is needed.   This patient is critically ill and at significant risk of neurological worsening, death and care requires constant monitoring of vital signs, hemodynamics,respiratory and cardiac monitoring, neurological assessment, discussion with family, other specialists and medical decision making of high complexity. I spent 70 minutes of neurocritical care time  in the care of  this patient. This was time spent independent of any time provided by nurse practitioner or PA.  Aisha Seals, MD [facility]   If 7pm- 7am, please page neurology on call as listed in AMION. 05/18/2024  8:25 PM

## 2024-05-18 NOTE — ED Notes (Signed)
 BIB EMS after family called out for new onset aphasia with left facial droop. Patient was also found to have left arm twitching and left facial twitching (unknown if this has happened before). Hx of a brain tumor. Not on blood thinners. LKN 1840.

## 2024-05-18 NOTE — ED Notes (Signed)
 Teleneurology notified Dr.Ray spoke with Dr.Kirkpatrick

## 2024-05-18 NOTE — ED Triage Notes (Signed)
 Patient was brought in by EMS as a CODE Stroke to [facility]. Hx of brain tumor and CT scan showed brain mitts. Patient was given Ativan  and Keppra  before transport arrived to bring to [facility]. Patient transfer from [facility] for seizures and neuro. Patient is to get an EEG.  20 RAC

## 2024-05-18 NOTE — ED Notes (Signed)
 Code stroke called in field by EMS /Patient to CT and rm 15 after CT

## 2024-05-18 NOTE — ED Notes (Signed)
 Dr.Ray spoke with Dr. Mliss Boyers /patient accepted ED/ED @[facility] 

## 2024-05-18 NOTE — Telephone Encounter (Signed)
 Received message from staff indicating that the patient needs to reschedule his 12/3 appointments with Dr. Sherrod due to his PET scan not yet completed. Patient also inquired about medication for claustrophobia prior to the PET scan. Cassie, PA, sent a prescription for Ativan  to the patient's pharmacy.  Sent message to Winton Pacini, who rescheduled the patient's PET scan for 06/03/24 at 9:00 AM. Attempted to contact the patient regarding rescheduling his lab appointment and visit with Dr. Sherrod. Left a voicemail informing him that he has been rescheduled for 06/09/24 with labs at 9:15 AM and a follow-up visit with Dr. Sherrod at 9:45 AM to review the scan results. Requested that the patient return the call with any questions.

## 2024-05-18 NOTE — ED Provider Notes (Signed)
  [facility]-EMERGENCY DEPT [facility] Emergency Department Provider Note MRN:  [id number]  Arrival date & time: 05/18/24     Chief Complaint   No chief complaint on file.   History of Present Illness   Lucas [name] is a 58 y.o. year-old male presents to the ED with chief complaint of aphasia and facial twitching.  Was initially seen at [facility] as an activated code stroke.  Patient has hx of brain mets.  Neurology was consulted and patient was transferred to [facility] for admission for EEG.  Patient was given ativan , keppra , and phenytoin  prior to arrival.  At present, he is not able to participate with his history due to sedation from these medications.  Hx from chart review and consultation with Dr. Voncile.   Review of Systems  Pertinent positive and negative review of systems noted in HPI.    Physical Exam   Vitals:   05/18/24 2149 05/18/24 2211  BP: 139/84   Pulse: 68   Resp: 20   Temp: 98.3 F (36.8 C)   SpO2: 100% 98%    CONSTITUTIONAL:  non toxic-appearing, NAD NEURO:  Sedated EYES:  eyes equal and reactive ENT/NECK:  Supple, no stridor  CARDIO:  normal rate, regular rhythm, appears well-perfused  PULM:  No respiratory distress, CTAB GI/GU:  non-distended,  MSK/SPINE:  No gross deformities, no edema, moves all extremities  SKIN:  no rash, atraumatic   *Additional and/or pertinent findings included in MDM below  Diagnostic and Interventional Summary    EKG Interpretation Date/Time:    Ventricular Rate:    PR Interval:    QRS Duration:    QT Interval:    QTC Calculation:   R Axis:      Text Interpretation:         Labs Reviewed - No data to display  No orders to display    Medications - No data to display   Procedures  /  Critical Care Procedures  ED Course and Medical Decision Making  I have reviewed the triage vital signs, the nursing notes, and pertinent available records from the EMR.  Social Determinants Affecting Complexity of Care: Patient has  no clinically significant social determinants affecting this chief complaint..   ED Course:    Medical Decision Making Patient transferred from [facility] for admission and EEG because they do not have EEG capacity over the holiday weekend.  Amount and/or Complexity of Data Reviewed Labs: ordered.  Risk Decision regarding hospitalization.         Consultants: I consulted with Dr. Arora, who recommends admission to medicine for EEG.  Neurology to follow. I consulted with Dr. Charlton, who is appreciated for admitting.  Treatment and Plan: Patient's exam and diagnostic results are concerning for seizure like activity.  Feel that patient will need admission to the hospital for further treatment and evaluation.    Final Clinical Impressions(s) / ED Diagnoses     ICD-10-CM   1. Seizure-like activity (HCC)  R56.9       ED Discharge Orders     None         Discharge Instructions Discussed with and Provided to Patient:   Discharge Instructions   None      Vicky Charleston, PA-C 05/18/24 2245    Ula Prentice SAUNDERS, MD 05/18/24 [phone]

## 2024-05-18 NOTE — ED Notes (Signed)
 Floor called to let them know pt is in route.

## 2024-05-18 NOTE — ED Notes (Signed)
 Patient verbally consented for transfer to [facility] ED.

## 2024-05-18 NOTE — H&P (Addendum)
 History and Physical    TADHG ESKEW FMW:[id number] DOB: 1966/05/12 DOA: 05/18/2024  PCP: [employer]   Patient coming from: Home   Chief Complaint: Left face and arm twitching   HPI: Lucas [name] is a 58 y.o. 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  Patient was apparently in his usual state until approximately 6:40 PM when he was noted to have difficulty speaking and twitching of the left face and arm.  He was brought into the ED as a code stroke.  [facility] ED Course: Upon arrival to the ED, patient was found to be afebrile and saturating well on room air with normal HR and stable BP.  Labs are most notable for WBC 18,800 and platelets 421,000.  CMP and magnesium have been ordered but not yet collected.  Head CT is negative for acute findings but notable for stable metastases and surrounding vasogenic edema.  Patient was evaluated by neurology, treated with 4 mg IV Ativan , 10 mg IV Decadron , 60 mg/kg IV Keppra , and 20 PE/kg fosphenytoin  in the ED.  He was transferred to [facility] for EEG.  Review of Systems:  Unable to complete ROS secondary to the patient's clinical condition.  Past Medical History:  Diagnosis Date   Brain tumor (benign) (HCC)    Hypertension     Past Surgical History:  Procedure Laterality Date   VIDEO BRONCHOSCOPY WITH ENDOBRONCHIAL NAVIGATION N/A 05/05/2024   Procedure: VIDEO BRONCHOSCOPY WITH ENDOBRONCHIAL NAVIGATION;  Surgeon: Kara Dorn NOVAK, MD;  Location: [facility] ENDOSCOPY;  Service: Pulmonary;  Laterality: N/A;   VIDEO BRONCHOSCOPY WITH ENDOBRONCHIAL ULTRASOUND Right 05/05/2024   Procedure: BRONCHOSCOPY, WITH EBUS;  Surgeon: Kara Dorn NOVAK, MD;  Location: [facility] ENDOSCOPY;  Service: Pulmonary;  Laterality: Right;    Social History:   reports that he has  been smoking cigarettes. He has a 44 pack-year smoking history. He has never used smokeless tobacco. He reports current alcohol use of about 7.0 standard drinks of alcohol per week. He reports current drug use. Drug: Marijuana.  Allergies  Allergen Reactions   Penicillins     Has patient had a PCN reaction causing immediate rash, facial/tongue/throat swelling, SOB or lightheadedness with hypotension: No Has patient had a PCN reaction causing severe rash involving mucus membranes or skin necrosis: No Has patient had a PCN reaction that required hospitalization: No Has patient had a PCN reaction occurring within the last 10 years: No If all of the above answers are NO, then may proceed with Cephalosporin use.     Family History  Problem Relation Age of Onset   Hypertension Father    Cancer Father      Prior to Admission medications   Medication Sig Start Date End Date Taking? Authorizing Provider  cetirizine (ZYRTEC) 10 MG tablet Take 10 mg by mouth daily as needed for allergies. 03/08/24   [provider]  cyclobenzaprine  (FLEXERIL ) 10 MG tablet Take 10 mg by mouth 3 (three) times daily as needed for muscle spasms.    [provider]  dexamethasone  (DECADRON ) 4 MG tablet Take 1 tablet (4 mg total) by mouth 3 (three) times daily. 05/05/24 06/04/24  Hongalgi, Anand D, MD  fluconazole  (DIFLUCAN ) 100 MG tablet Take 2 tablets today, then 1 tablet daily x 20 more days. 05/17/24   Izell Domino, MD  gabapentin  (  NEURONTIN ) 400 MG capsule Take 1 capsule (400 mg total) by mouth daily as needed (for pain). 05/05/24   Hongalgi, Anand D, MD  LORazepam  (ATIVAN ) 0.5 MG tablet Take 1 tablet (0.5 mg total) by mouth 20 minutes prior to PET scan. Do not drive after taking medication. 05/18/24   Heilingoetter, Cassandra L, PA-C  naproxen  (NAPRELAN ) 500 MG 24 hr tablet Take 500 mg by mouth as needed (As needed for pain up to twice a day).    [provider]  nicotine  (NICODERM CQ  -  DOSED IN MG/24 HOURS) 14 mg/24hr patch Place 1 patch (14 mg total) onto the skin daily. Apply 21 mg patch daily x 6 wk, then 14mg  patch daily x 2 wk, then 7 mg patch daily x 2 wk 05/11/24   Wyatt Leeroy HERO, PA-C  nicotine  (NICODERM CQ  - DOSED IN MG/24 HOURS) 21 mg/24hr patch Place 1 patch (21 mg total) onto the skin daily. Apply 21 mg patch daily x 6 wk, then 14mg  patch daily x 2 wk, then 7 mg patch daily x 2 wk 05/11/24   Wyatt Leeroy HERO, PA-C  nicotine  (NICODERM CQ  - DOSED IN MG/24 HR) 7 mg/24hr patch Place 1 patch (7 mg total) onto the skin daily. 05/06/24   Hongalgi, Anand D, MD  nicotine  (NICODERM CQ  - DOSED IN MG/24 HR) 7 mg/24hr patch Place 1 patch (7 mg total) onto the skin daily. Apply 21 mg patch daily x 6 wk, then 14mg  patch daily x 2 wk, then 7 mg patch daily x 2 wk 05/11/24   Wyatt Leeroy HERO, PA-C  pantoprazole  (PROTONIX ) 40 MG tablet Take 1 tablet (40 mg total) by mouth daily. 04/21/24 04/21/25  Dorothyann Drivers, MD  polyethylene glycol powder (GLYCOLAX /MIRALAX ) 17 GM/SCOOP powder Take 17 g by mouth 2 (two) times daily. Dissolve 1 capful (17g) in 4-8 ounces of liquid and take by mouth daily. 05/05/24   Hongalgi, Anand D, MD  pregabalin  (LYRICA ) 25 MG capsule Take 25 mg by mouth every 8 (eight) hours. 03/01/24   [provider]  sucralfate  (CARAFATE ) 1 g tablet Take 1 tablet (1 g total) by mouth 4 (four) times daily for 15 days. 04/21/24 05/11/24  Dorothyann Drivers, MD  tadalafil (CIALIS) 20 MG tablet Take 20 mg by mouth once as needed for erectile dysfunction. 03/28/24   [provider]    Physical Exam: Vitals:   05/18/24 2149 05/18/24 2211 05/18/24 2230 05/18/24 2300  BP: 139/84  124/82 125/82  Pulse: 68  68   Resp: 20     Temp: 98.3 F (36.8 C)     TempSrc: Oral     SpO2: 100% 98% 98%     Constitutional: NAD, no pallor or diaphoresis   Eyes: PERTLA, lids and conjunctivae normal ENMT: Mucous membranes are moist. Posterior pharynx clear of any exudate or lesions.    Neck: supple, no masses  Respiratory: no wheezing, no crackles. No accessory muscle use.  Cardiovascular: S1 & S2 heard, regular rate and rhythm. No extremity edema.   Abdomen: No distension, soft. Bowel sounds active.  Musculoskeletal: no clubbing / cyanosis. No joint deformity upper and lower extremities.   Skin: no significant rashes, lesions, ulcers. Warm, dry, well-perfused. Neurologic: no gross facial asymmetry. Spontaneous eye opening. Moving all extremities spontaneously. Pushes examiner away.  No appreciable twitching. Not answering questions.    Labs and Imaging on Admission: I have personally reviewed following labs and imaging studies  CBC: Recent Labs  Lab 05/12/24 1324 05/18/24 1940  WBC 16.1* 18.8*  NEUTROABS 12.5* 13.9*  HGB 12.9* 13.7  HCT 37.8* 42.2  MCV 89.8 92.7  PLT 493* 421*   Basic Metabolic Panel: Recent Labs  Lab 05/12/24 1324  NA 136  K 3.9  CL 100  CO2 24  GLUCOSE 126*  BUN 20  CREATININE 0.88  CALCIUM 9.3   GFR: Estimated Creatinine Clearance: 83.5 mL/min (by C-G formula based on SCr of 0.88 mg/dL). Liver Function Tests: Recent Labs  Lab 05/12/24 1324  AST 24  ALT 9  ALKPHOS 107  BILITOT 0.2  PROT 7.2  ALBUMIN 3.8   No results for input(s): LIPASE, AMYLASE in the last 168 hours. No results for input(s): AMMONIA in the last 168 hours. Coagulation Profile: Recent Labs  Lab 05/18/24 1939  INR 1.0   Cardiac Enzymes: No results for input(s): CKTOTAL, CKMB, CKMBINDEX, TROPONINI in the last 168 hours. BNP (last 3 results) No results for input(s): PROBNP in the last 8760 hours. HbA1C: No results for input(s): HGBA1C in the last 72 hours. CBG: Recent Labs  Lab 05/18/24 1908  GLUCAP 121*   Lipid Profile: No results for input(s): CHOL, HDL, LDLCALC, TRIG, CHOLHDL, LDLDIRECT in the last 72 hours. Thyroid Function Tests: No results for input(s): TSH, T4TOTAL, FREET4, T3FREE, THYROIDAB in  the last 72 hours. Anemia Panel: No results for input(s): VITAMINB12, FOLATE, FERRITIN, TIBC, IRON, RETICCTPCT in the last 72 hours. Urine analysis:    Component Value Date/Time   COLORURINE YELLOW (A) 04/21/2024 0927   APPEARANCEUR CLEAR (A) 04/21/2024 0927   APPEARANCEUR Clear 02/01/2020 1037   LABSPEC >1.046 (H) 04/21/2024 0927   LABSPEC 1.018 01/16/2014 0717   PHURINE 6.0 04/21/2024 0927   GLUCOSEU NEGATIVE 04/21/2024 0927   GLUCOSEU Negative 01/16/2014 0717   HGBUR NEGATIVE 04/21/2024 0927   BILIRUBINUR NEGATIVE 04/21/2024 0927   BILIRUBINUR Negative 02/01/2020 1037   BILIRUBINUR Negative 01/16/2014 0717   KETONESUR NEGATIVE 04/21/2024 0927   PROTEINUR NEGATIVE 04/21/2024 0927   NITRITE NEGATIVE 04/21/2024 0927   LEUKOCYTESUR NEGATIVE 04/21/2024 0927   LEUKOCYTESUR Negative 01/16/2014 0717   Sepsis Labs: @LABRCNTIP (procalcitonin:4,lacticidven:4) )No results found for this or any previous visit (from the past 240 hours).   Radiological Exams on Admission: CT HEAD CODE STROKE WO CONTRAST (LKW 0-4.5h, LVO 0-24h) Result Date: 05/18/2024 EXAM: CT HEAD WITHOUT 05/18/2024 07:17:58 PM TECHNIQUE: CT of the head was performed without the administration of intravenous contrast. Automated exposure control, iterative reconstruction, and/or weight based adjustment of the mA/kV was utilized to reduce the radiation dose to as low as reasonably achievable. COMPARISON: Comparison made with prior MRI from 05/10/2024. CLINICAL HISTORY: Neuro deficit, acute, stroke suspected. FINDINGS: BRAIN AND VENTRICLES: Previously identified intracranial metastases involving the right frontal and left parietal lobes, again seen, and grossly similar to previous. Surrounding vasogenic edema without midline shift, also similar. No visible new lesions on this noncontrast examination. No acute intracranial hemorrhage. No evidence of acute infarct. No hydrocephalus. No extra-axial fluid collection. Calcified  atherosclerosis present about the skull base. ORBITS: No acute abnormality. SINUSES AND MASTOIDS: No acute abnormality. SOFT TISSUES AND SKULL: No acute skull fracture. No acute soft tissue abnormality. IMPRESSION: 1. No acute intracranial abnormality. 2. Aspects is 10. 3. Stable intracranial metastases involving the right frontal and left parietal lobes with surrounding vasogenic edema, without midline shift, similar to prior MRI from 05/10/2024. 4. Findings communicated by text page to Dr. Michaela at 7:28 pm on 05/18/2024. Electronically signed by: Morene Hoard MD 05/18/2024 07:31 PM EST RP Workstation: [id number]  EKG: Independently reviewed. Sinus rhythm, PVCs.   Assessment/Plan   1. Focal status epilepticus  - Presents with left arm and facial twitching, has stable head CT with no ICH, was evaluated by neurology, given 4 mg IV Ativan , 10 mg IV Decadron , 60 mg/kg Keppra , and 20 PE/kg fosphenytoin    - Continue seizure precautions, Decadron , Keppra , and phenytoin   2. Non-small cell lung cancer - Recently diagnosed stage IV NSCLC with adrenal and brain mets, undergoing radiation therapy to brain and molecular marker testing to guide additional therapy under the care of Dr. Sherrod  - Continue Decadron     DVT prophylaxis: Lovenox   Code Status: Full  Level of Care: Level of care: Progressive Family Communication: Fianc updated from ED Disposition Plan:  Patient is from: Home  Anticipated d/c is to: TBD Anticipated d/c date is: 05/21/24  Patient currently: Pending EEG, treatment of seizures  Consults called: Neurology  Admission status: Inpatient     Evalene GORMAN Sprinkles, MD [facility]  05/18/2024, 11:17 PM

## 2024-05-18 NOTE — ED Provider Notes (Signed)
 [facility] Provider Note    Event Date/Time   First MD Initiated Contact with Patient 05/18/24 1920     (approximate)   History   Code Stroke   HPI  Lucas [name] is a 58 year old male with metastatic lung cancer with known brain mets presenting to the ER for evaluation of facial twitching and aphasia as a code stroke.  Patient was seen at his baseline with family when at 640 he was noted to have onset of twitching over the left side of his face with an inability to speak.  Code stroke was activated prehospital.    Physical Exam   Triage Vital Signs: ED Triage Vitals [05/18/24 1922]  Encounter Vitals Group     BP (!) 160/104     Girls Systolic BP Percentile      Girls Diastolic BP Percentile      Boys Systolic BP Percentile      Boys Diastolic BP Percentile      Pulse Rate 92     Resp 18     Temp      Temp src      SpO2 100 %     Weight      Height      Head Circumference      Peak Flow      Pain Score      Pain Loc      Pain Education      Exclude from Growth Chart     Most recent vital signs: Vitals:   05/18/24 2005 05/18/24 2006  BP:  139/80  Pulse:  87  Resp:  (!) 23  Temp: 97.6 F (36.4 C)   SpO2:  100%     General: Awake, visible facial twitching CV:  Good peripheral perfusion Resp:  Unlabored respirations Abd:  Nondistended.  Neuro:  Keenly aware, correctly answers month and age, able to blink eyes and squeeze hands, normal horizontal extraocular movements, no visual field loss, left facial droop with facial twitching present, occasional arm twitching, left arm drifts, no drift with remainder of extremities, expressive aphasia present   ED Results / Procedures / Treatments   Labs (all labs ordered are listed, but only abnormal results are displayed) Labs Reviewed  CBC - Abnormal; Notable for the following components:      Result Value   WBC 18.8 (*)    Platelets 421 (*)    All other components within normal  limits  DIFFERENTIAL - Abnormal; Notable for the following components:   Neutro Abs 13.9 (*)    Monocytes Absolute 1.9 (*)    Abs Immature Granulocytes 0.25 (*)    All other components within normal limits  CBG MONITORING, ED - Abnormal; Notable for the following components:   Glucose-Capillary 121 (*)    All other components within normal limits  PROTIME-INR  APTT  URINE DRUG SCREEN  MAGNESIUM  COMPREHENSIVE METABOLIC PANEL WITH GFR     EKG EKG independently reviewed and interpreted by myself demonstrates:  EKG demonstrate sinus rhythm at a rate of 85, PR 125, QRS 85, QTc 412, no acute ST changes  RADIOLOGY Imaging independently reviewed and interpreted by myself demonstrates:  CT head without acute bleed, stable intracranial masses with associated vasogenic edema noted  Formal Radiology Read:  CT HEAD CODE STROKE WO CONTRAST (LKW 0-4.5h, LVO 0-24h) Result Date: 05/18/2024 EXAM: CT HEAD WITHOUT 05/18/2024 07:17:58 PM TECHNIQUE: CT of the head was performed without the administration of intravenous contrast. Automated  exposure control, iterative reconstruction, and/or weight based adjustment of the mA/kV was utilized to reduce the radiation dose to as low as reasonably achievable. COMPARISON: Comparison made with prior MRI from 05/10/2024. CLINICAL HISTORY: Neuro deficit, acute, stroke suspected. FINDINGS: BRAIN AND VENTRICLES: Previously identified intracranial metastases involving the right frontal and left parietal lobes, again seen, and grossly similar to previous. Surrounding vasogenic edema without midline shift, also similar. No visible new lesions on this noncontrast examination. No acute intracranial hemorrhage. No evidence of acute infarct. No hydrocephalus. No extra-axial fluid collection. Calcified atherosclerosis present about the skull base. ORBITS: No acute abnormality. SINUSES AND MASTOIDS: No acute abnormality. SOFT TISSUES AND SKULL: No acute skull fracture. No acute  soft tissue abnormality. IMPRESSION: 1. No acute intracranial abnormality. 2. Aspects is 10. 3. Stable intracranial metastases involving the right frontal and left parietal lobes with surrounding vasogenic edema, without midline shift, similar to prior MRI from 05/10/2024. 4. Findings communicated by text page to Dr. Michaela at 7:28 pm on 05/18/2024. Electronically signed by: Morene Hoard MD 05/18/2024 07:31 PM EST RP Workstation: [id number]    PROCEDURES:  Critical Care performed: Yes, see critical care procedure note(s)  CRITICAL CARE Performed by: Nilsa Dade   Total critical care time: 31 minutes  Critical care time was exclusive of separately billable procedures and treating other patients.  Critical care was necessary to treat or prevent imminent or life-threatening deterioration.  Critical care was time spent personally by me on the following activities: development of treatment plan with patient and/or surrogate as well as nursing, discussions with consultants, evaluation of patient's response to treatment, examination of patient, obtaining history from patient or surrogate, ordering and performing treatments and interventions, ordering and review of laboratory studies, ordering and review of radiographic studies, pulse oximetry and re-evaluation of patient's condition.   Procedures   MEDICATIONS ORDERED IN ED: Medications  LORazepam  (ATIVAN ) 2 MG/ML injection (0 mg  Hold 05/18/24 1942)  fosPHENYtoin  (CEREBYX ) 1,398 mg PE in sodium chloride  0.9 % 50 mL IVPB (has no administration in time range)  levETIRAcetam  (KEPPRA ) undiluted injection 4,250 mg (4,250 mg Intravenous Given 05/18/24 1931)  LORazepam  (ATIVAN ) injection 2 mg (2 mg Intravenous Given 05/18/24 1927)  LORazepam  (ATIVAN ) injection 2 mg (2 mg Intravenous Given 05/18/24 1935)  dexamethasone  (DECADRON ) injection 10 mg (10 mg Intravenous Given 05/18/24 1957)     IMPRESSION / MDM / ASSESSMENT AND PLAN / ED  COURSE  I reviewed the triage vital signs and the nursing notes.  Differential diagnosis includes, but is not limited to, focal seizures related to known brain metastases, intracranial hemorrhage, CVA, electrolyte abnormality  Patient's presentation is most consistent with acute presentation with potential threat to life or bodily function.  58 year old male presenting to the ER for evaluation of facial twitching and aphasia as a code stroke.  Taken to CT scanner on arrival.  Discussed with neurology as below.  Clinical Course as of 05/18/24 2028  Wed May 18, 2024  2005 Discussed with Dr. Michaela with neurology team.  High concern for seizures with clinical history of brain mets and rhythmic jaw twitching and occasional arm twitching.  Patient did receive 4 mg of Ativan  with persistent twitching of the face.  He was loaded with Keppra  without improvement.  Fosphenytoin  was ordered.  Given concerns further status epilepticus, Dr. Michaela did recommend transfer to [facility] for EEG capability.  He has discussed the case with Dr. Arora. [NR]  2020 Case discussed with Dr. Dean with ER at [facility].  Patient accepted as an ER to ER transfer. [NR]    Clinical Course User Index [NR] Levander Slate, MD     FINAL CLINICAL IMPRESSION(S) / ED DIAGNOSES   Final diagnoses:  Focal seizures (HCC)     Rx / DC Orders   ED Discharge Orders     None        Note:  This document was prepared using Dragon voice recognition software and may include unintentional dictation errors.   Levander Slate, MD 05/19/24 0005

## 2024-05-19 ENCOUNTER — Inpatient Hospital Stay (HOSPITAL_COMMUNITY)

## 2024-05-19 DIAGNOSIS — R569 Unspecified convulsions: Secondary | ICD-10-CM

## 2024-05-19 DIAGNOSIS — G40901 Epilepsy, unspecified, not intractable, with status epilepticus: Secondary | ICD-10-CM

## 2024-05-19 LAB — PHOSPHORUS: Phosphorus: 4.5 mg/dL (ref 2.5–4.6)

## 2024-05-19 LAB — COMPREHENSIVE METABOLIC PANEL WITH GFR
ALT: 20 U/L (ref 0–44)
ALT: 18 U/L (ref 0–44)
AST: 27 U/L (ref 15–41)
AST: 26 U/L (ref 15–41)
Albumin: 3 g/dL — ABNORMAL LOW (ref 3.5–5.0)
Albumin: 3 g/dL — ABNORMAL LOW (ref 3.5–5.0)
Alkaline Phosphatase: 82 U/L (ref 38–126)
Alkaline Phosphatase: 81 U/L (ref 38–126)
Anion gap: 12 (ref 5–15)
Anion gap: 13 (ref 5–15)
BUN: 22 mg/dL — ABNORMAL HIGH (ref 6–20)
BUN: 22 mg/dL — ABNORMAL HIGH (ref 6–20)
CO2: 26 mmol/L (ref 22–32)
CO2: 24 mmol/L (ref 22–32)
Calcium: 9.1 mg/dL (ref 8.9–10.3)
Calcium: 9.2 mg/dL (ref 8.9–10.3)
Chloride: 99 mmol/L (ref 98–111)
Chloride: 97 mmol/L — ABNORMAL LOW (ref 98–111)
Creatinine, Ser: 1.07 mg/dL (ref 0.61–1.24)
Creatinine, Ser: 1.05 mg/dL (ref 0.61–1.24)
GFR, Estimated: >60 mL/min (ref >60)
GFR, Estimated: >60 mL/min (ref >60)
Glucose, Bld: 191 mg/dL — ABNORMAL HIGH (ref 70–99)
Glucose, Bld: 189 mg/dL — ABNORMAL HIGH (ref 70–99)
Potassium: 4.5 mmol/L (ref 3.5–5.1)
Potassium: 4.1 mmol/L (ref 3.5–5.1)
Sodium: 137 mmol/L (ref 135–145)
Sodium: 134 mmol/L — ABNORMAL LOW (ref 135–145)
Total Bilirubin: 0.6 mg/dL (ref 0.0–1.2)
Total Bilirubin: 0.5 mg/dL (ref 0.0–1.2)
Total Protein: 7.2 g/dL (ref 6.5–8.1)
Total Protein: 6 g/dL — ABNORMAL LOW (ref 6.5–8.1)

## 2024-05-19 LAB — CBC
HCT: 39.9 % (ref 39.0–52.0)
Hemoglobin: 13.4 g/dL (ref 13.0–17.0)
MCH: 30.3 pg (ref 26.0–34.0)
MCHC: 33.6 g/dL (ref 30.0–36.0)
MCV: 90.3 fL (ref 80.0–100.0)
Platelets: 442 K/uL — ABNORMAL HIGH (ref 150–400)
RBC: 4.42 MIL/uL (ref 4.22–5.81)
RDW: 14 % (ref 11.5–15.5)
WBC: 21.6 K/uL — ABNORMAL HIGH (ref 4.0–10.5)
nRBC: 0 % (ref 0.0–0.2)

## 2024-05-19 LAB — VITAMIN B12: Vitamin B-12: 247 pg/mL (ref 180–914)

## 2024-05-19 LAB — MAGNESIUM: Magnesium: 2.3 mg/dL (ref 1.7–2.4)

## 2024-05-19 LAB — PHENYTOIN LEVEL, TOTAL: Phenytoin Lvl: 19.6 ug/mL (ref 10.0–20.0)

## 2024-05-19 MED ORDER — NICOTINE 21 MG/24HR TD PT24
21.0000 mg | MEDICATED_PATCH | Freq: Every day | TRANSDERMAL | Status: DC
  Administered 2024-05-19 – 2024-05-20 (×2): 21 mg via TRANSDERMAL
  Filled 2024-05-19 (×2): qty 1

## 2024-05-19 MED ORDER — LORAZEPAM 2 MG/ML IJ SOLN
1.0000 mg | Freq: Once | INTRAMUSCULAR | Status: AC
  Administered 2024-05-19: 1 mg via INTRAVENOUS
  Filled 2024-05-19: qty 1

## 2024-05-19 MED ORDER — HALOPERIDOL LACTATE 5 MG/ML IJ SOLN
2.0000 mg | Freq: Four times a day (QID) | INTRAMUSCULAR | Status: DC | PRN
  Administered 2024-05-19: 2 mg via INTRAMUSCULAR
  Filled 2024-05-19: qty 1

## 2024-05-19 MED ORDER — SODIUM CHLORIDE 0.9 % IV SOLN: INTRAVENOUS | Status: AC

## 2024-05-19 MED ORDER — PANTOPRAZOLE SODIUM 40 MG IV SOLR
40.0000 mg | Freq: Two times a day (BID) | INTRAVENOUS | Status: DC
  Administered 2024-05-19: 40 mg via INTRAVENOUS
  Filled 2024-05-19 (×2): qty 10

## 2024-05-19 MED ORDER — PANTOPRAZOLE SODIUM 40 MG PO TBEC: 40.0000 mg | DELAYED_RELEASE_TABLET | Freq: Every day | ORAL | Status: DC

## 2024-05-19 MED ORDER — DIAZEPAM 5 MG/ML IJ SOLN
5.0000 mg | Freq: Once | INTRAMUSCULAR | Status: AC
  Administered 2024-05-19: 5 mg via INTRAMUSCULAR
  Filled 2024-05-19: qty 2

## 2024-05-19 MED ORDER — PANTOPRAZOLE SODIUM 40 MG IV SOLR
40.0000 mg | Freq: Two times a day (BID) | INTRAVENOUS | Status: DC
  Administered 2024-05-20: 40 mg via INTRAVENOUS
  Filled 2024-05-19: qty 10

## 2024-05-19 NOTE — Plan of Care (Signed)
  Problem: Safety: Goal: Non-violent Restraint(s) 05/19/2024 0520 by Baldwin Doran BIRCH, RN Outcome: Not Progressing 05/19/2024 0520 by Baldwin Doran BIRCH, RN Outcome: Not Progressing   Problem: Education: Goal: Knowledge of General Education information will improve Description: Including pain rating scale, medication(s)/side effects and non-pharmacologic comfort measures 05/19/2024 0520 by Baldwin Doran BIRCH, RN Outcome: Not Progressing 05/19/2024 0520 by Baldwin Doran BIRCH, RN Outcome: Not Progressing   Problem: Health Behavior/Discharge Planning: Goal: Ability to manage health-related needs will improve 05/19/2024 0520 by Baldwin Doran BIRCH, RN Outcome: Not Progressing 05/19/2024 0520 by Baldwin Doran BIRCH, RN Outcome: Not Progressing   Problem: Clinical Measurements: Goal: Ability to maintain clinical measurements within normal limits will improve 05/19/2024 0520 by Baldwin Doran BIRCH, RN Outcome: Not Progressing 05/19/2024 0520 by Baldwin Doran BIRCH, RN Outcome: Not Progressing Goal: Will remain free from infection 05/19/2024 0520 by Baldwin Doran BIRCH, RN Outcome: Not Progressing 05/19/2024 0520 by Baldwin Doran BIRCH, RN Outcome: Not Progressing Goal: Diagnostic test results will improve 05/19/2024 0520 by Baldwin Doran BIRCH, RN Outcome: Not Progressing 05/19/2024 0520 by Baldwin Doran BIRCH, RN Outcome: Not Progressing Goal: Respiratory complications will improve 05/19/2024 0520 by Baldwin Doran BIRCH, RN Outcome: Not Progressing 05/19/2024 0520 by Baldwin Doran D, RN Outcome: Not Progressing Goal: Cardiovascular complication will be avoided 05/19/2024 0520 by Baldwin Doran BIRCH, RN Outcome: Not Progressing 05/19/2024 0520 by Baldwin Doran BIRCH, RN Outcome: Not Progressing   Problem: Activity: Goal: Risk for activity intolerance will decrease 05/19/2024 0520 by Baldwin Doran BIRCH, RN Outcome: Not Progressing 05/19/2024 0520 by Baldwin Doran BIRCH,  RN Outcome: Not Progressing   Problem: Nutrition: Goal: Adequate nutrition will be maintained 05/19/2024 0520 by Baldwin Doran BIRCH, RN Outcome: Not Progressing 05/19/2024 0520 by Baldwin Doran BIRCH, RN Outcome: Not Progressing

## 2024-05-19 NOTE — Progress Notes (Signed)
 EEG complete - results pending

## 2024-05-19 NOTE — Progress Notes (Signed)
 Patient ID: Lucas Yu, male   DOB: Jan 10, 1966, 58 y.o.   MRN: [id number] NEUROLOGY CONSULT FOLLOW UP NOTE   Date of service: May 19, 2024 Patient Name: Lucas Yu MRN:  [id number] DOB:  03-14-1966  Interval Hx/subjective   Doing well this morning.  Still requiring restraints but was calm and cooperative when I was talking with the patient.  Apparently he wants to go home for Thanksgiving and thus the reason for the agitation.  He was agitated per nurse tech who is doing patient care but calmed down after that.  Vitals   Vitals:   05/18/24 2343 05/19/24 0436 05/19/24 0735 05/19/24 1542  BP: (!) 131/99 132/89 120/73 (!) 141/91  Pulse: 78 73 70 83  Resp: 18 20 19 19   Temp: 98.4 F (36.9 C) 97.7 F (36.5 C) 97.8 F (36.6 C) 98.8 F (37.1 C)  TempSrc: Oral Oral Axillary   SpO2: 99% 98% 97% 100%     There is no height or weight on file to calculate BMI.  Physical Exam   Constitutional: Appears well-developed and well-nourished.  Psych: Affect appropriate to situation.  Eyes: No scleral injection.  HENT: No OP obstrucion.  Head: Normocephalic.  Respiratory: Effort normal, non-labored breathing.  GI: Soft.  No distension. There is no tenderness.  Skin: WDI.   Neurologic Examination   Alert awake.  Oriented to person place, month and year. Follows all commands. No aphasia. Cranials 2 through 12: Pupils equal and reactive.  Face symmetric.  Motor exam nonfocal moving all 4 extremities he is in restraints. No ataxia    Medications  Current Facility-Administered Medications:    0.9 %  sodium chloride  infusion, , Intravenous, Continuous, Von Bellis, MD   acetaminophen  (TYLENOL ) tablet 650 mg, 650 mg, Oral, Q6H PRN **OR** acetaminophen  (TYLENOL ) suppository 650 mg, 650 mg, Rectal, Q6H PRN, Opyd, Evalene RAMAN, MD   dexamethasone  (DECADRON ) injection 4 mg, 4 mg, Intravenous, Q8H, Opyd, Timothy S, MD, 4 mg at 05/19/24 0448   enoxaparin  (LOVENOX ) injection 40 mg, 40  mg, Subcutaneous, Daily, Opyd, Timothy S, MD, 40 mg at 05/19/24 9071   haloperidol  lactate (HALDOL ) injection 2 mg, 2 mg, Intramuscular, Q6H PRN, Von Bellis, MD   levETIRAcetam  (KEPPRA ) undiluted injection 1,500 mg, 1,500 mg, Intravenous, BID, Opyd, Timothy S, MD, 1,500 mg at 05/19/24 9071   nicotine  (NICODERM CQ  - dosed in mg/24 hours) patch 21 mg, 21 mg, Transdermal, Daily, Von Bellis, MD   ondansetron  (ZOFRAN ) tablet 4 mg, 4 mg, Oral, Q6H PRN **OR** ondansetron  (ZOFRAN ) injection 4 mg, 4 mg, Intravenous, Q6H PRN, Opyd, Timothy S, MD   pantoprazole  (PROTONIX ) injection 40 mg, 40 mg, Intravenous, Q12H **FOLLOWED BY** [START ON 05/21/2024] pantoprazole  (PROTONIX ) EC tablet 40 mg, 40 mg, Oral, Daily, Von Bellis, MD   phenytoin  (DILANTIN ) injection 100 mg, 100 mg, Intravenous, Q8H, Opyd, Timothy S, MD, 100 mg at 05/19/24 0448   sodium chloride  flush (NS) 0.9 % injection 3 mL, 3 mL, Intravenous, Q12H, Opyd, Evalene RAMAN, MD, 3 mL at 05/19/24 0929  Labs and Diagnostic Imaging   CBC:  Recent Labs  Lab 05/18/24 1940 05/18/24 2346 05/19/24 0145  WBC 18.8*  --  21.6*  NEUTROABS 13.9*  --   --   HGB 13.7 15.3 13.4  HCT 42.2 45.0 39.9  MCV 92.7  --  90.3  PLT 421*  --  442*    Basic Metabolic Panel:  Lab Results  Component Value Date   NA 134 (L) 05/19/2024  K 4.1 05/19/2024   CO2 24 05/19/2024   GLUCOSE 189 (H) 05/19/2024   BUN 22 (H) 05/19/2024   CREATININE 1.05 05/19/2024   CALCIUM 9.2 05/19/2024   GFRNONAA >60 05/19/2024   GFRAA 93 11/30/2019   Lipid Panel:  Lab Results  Component Value Date   LDLCALC 57 11/30/2019   HgbA1c:  Lab Results  Component Value Date   HGBA1C 5.5 11/30/2019   Urine Drug Screen: No results found for: LABOPIA, COCAINSCRNUR, LABBENZ, AMPHETMU, THCU, LABBARB  Alcohol Level     Component Value Date/Time   ETH <15 05/03/2024 1153   INR  Lab Results  Component Value Date   INR 1.0 05/18/2024   APTT  Lab Results  Component  Value Date   APTT <22 (L) 05/18/2024   AED levels: No results found for: PHENYTOIN , ZONISAMIDE, LAMOTRIGINE, LEVETIRACETA  CT Head without contrast(Personally reviewed):  IMPRESSION: 1. No acute intracranial abnormality. 2. Aspects is 10. 3. Stable intracranial metastases involving the right frontal and left parietal lobes with surrounding vasogenic edema, without midline shift, similar to prior MRI from 05/10/2024.  MRI Brain(Personally reviewed): 05/10/24 MPRESSION: 1. Total of four enhancing brain metastases on 3T ranging from punctate (anterior left frontal) to 18 mm (posterior right frontal), all annotated on series 1100. 2. Stable vasogenic edema in the posterior right frontal lobe and posterior left hemisphere. No significant intracranial mass effect.      Assessment   BRANDELL MAREADY is a 58 y.o. 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  Presented at [facility] as a code stroke but likely appears to be a seizure.  Given Decadron  10 mg IV and loaded with Keppra  and Dilantin .  Agitated intermittently.  EEG completed shows no seizures.  I do not see need for LTM at that time since he is awake alert and can follow clinically. Recommendations   Resume home dose Decadron  schedule with 4 mg 3 times daily for now. Continue Keppra  1500 mg twice daily Continue Dilantin  100 mg 3 times daily Check Dilantin  level. No need for LTM at the moment. Follow-up with oncology for further instructions regarding brain mets and swelling  Call neurology if he has seizure activity.   Signed, Lucas CHRISTELLA Raddle, MD [facility]

## 2024-05-19 NOTE — Procedures (Signed)
 Patient Name: APOLO CUTSHAW  MRN: [id number]  Epilepsy Attending: Pastor Falling  Referring Physician/Provider: No ref. provider found      Date: 05/19/2024 Duration: 23 minutes   Patient history: 58 year old man with seizure like activity. EEG for further evaluation   Level of alertness: Awake, drowsy  AEDs during EEG study:   Technical aspects: This EEG study was done with scalp electrodes positioned according to the 10-20 International system of electrode placement. Electrical activity was reviewed with band pass filter of 1-70Hz , sensitivity of 7 uV/mm, display speed of 72mm/sec with a 60Hz  notched filter applied as appropriate. EEG data were recorded continuously and digitally stored.  Video monitoring was available and reviewed as appropriate.  Description: The posterior dominant rhythm consists of 9-10 Hz activity of moderate voltage (25-35 uV) seen predominantly in posterior head regions, symmetric and reactive to eye opening and eye closing. Drowsiness was characterized by attenuation of the posterior background rhythm. Sleep was not seen. Hyperventilation and photic stimulation were not performed.      IMPRESSION: This study is within normal limits. No seizures or epileptiform discharges were seen throughout the recording. A normal interictal EEG does not exclude nor support the diagnosis of epilepsy.   Pastor Falling MD Neurology

## 2024-05-19 NOTE — Progress Notes (Signed)
 [facility] Progress Note  Patient: Lucas Yu    FMW:[id number]  DOA: 05/18/2024     Date of Service: the patient was seen and examined on 05/19/2024  No chief complaint on file.  Brief hospital course: Lucas Yu is a 58 y.o. 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   Patient was apparently in his usual state until approximately 6:40 PM when he was noted to have difficulty speaking and twitching of the left face and arm.  He was brought into the ED as a code stroke.   [facility] ED Course: Upon arrival to the ED, patient was found to be afebrile and saturating well on room air with normal HR and stable BP.  Labs are most notable for WBC 18,800 and platelets 421,000.  CMP and magnesium have been ordered but not yet collected.  Head CT is negative for acute findings but notable for stable metastases and surrounding vasogenic edema.   Patient was evaluated by neurology, treated with 4 mg IV Ativan , 10 mg IV Decadron , 60 mg/kg IV Keppra , and 20 PE/kg fosphenytoin  in the ED.  He was transferred to [facility] for EEG.   Assessment and Plan:  # Focal status epilepticus  - Presents with left arm and facial twitching, has stable head CT with no ICH, was evaluated by neurology, given 4 mg IV Ativan , 10 mg IV Decadron , 60 mg/kg Keppra , and 20 PE/kg fosphenytoin    - Continue seizure precautions,  Decadron  4 mg 3 times daily, Keppra  1500 twice daily, and phenytoin  100 3 times daily Started PPI for GI prophylaxis Continue IV fluid for hydration Follow EEG Follow neurology recommendation   # Non-small cell lung cancer - Recently diagnosed stage IV NSCLC with adrenal and brain mets, undergoing radiation therapy to brain and molecular marker testing to guide additional therapy under the care of Dr. Sherrod  -  Continue Decadron  4 mg 3 times a day Follow with oncologist as an outpatient for continued management of lung cancer with metastasis.   There is no height or weight on file to calculate BMI.  Interventions:  Diet: Regular diet DVT Prophylaxis: Subcutaneous Lovenox    Advance goals of care discussion: Full code  Family Communication: family was not present at bedside, at the time of interview.  The pt provided permission to discuss medical plan with the family. Opportunity was given to ask question and all questions were answered satisfactorily.   Disposition:  Pt is from home, admitted with seizure disorders, still has seizures, which precludes a safe discharge. Discharge to home, when stable and cleared by neurology.  Subjective: No significant events overnight.  Patient was very agitated in the afternoon due to being in the hospital.  He wanted to go home. Patient was given Valium  IM and Haldol  as needed Patient calmed down now after some time.  Physical Exam: General: Agitated because patient wanted to go home Appear in mild to moderate distress Eyes: PERRLA ENT: Oral Mucosa Clear, moist  Neck: no JVD,  Cardiovascular: S1 and S2 Present, no Murmur,  Respiratory: good respiratory effort, Bilateral Air entry equal and Decreased, no Crackles, no wheezes Abdomen: Bowel Sound present, Soft and no tenderness,  Skin: no rashes Extremities: no Pedal edema, no calf tenderness Neurologic: without any new focal findings Gait not checked due to patient safety concerns  Vitals:   05/18/24 2343 05/19/24 0436 05/19/24 0735 05/19/24 1542  BP: (!) 131/99 132/89 120/73 (!) 141/91  Pulse: 78 73 70 83  Resp: 18 20 19 19   Temp: 98.4 F (36.9 C) 97.7 F (36.5 C) 97.8 F (36.6 C) 98.8 F (37.1 C)  TempSrc: Oral Oral Axillary   SpO2: 99% 98% 97% 100%    Intake/Output Summary (Last 24 hours) at 05/19/2024 1717 Last data filed at 05/19/2024 0500 Gross per 24 hour  Intake 240 ml  Output  290 ml  Net -50 ml   There were no vitals filed for this visit.  Data Reviewed: I have personally reviewed and interpreted daily labs, tele strips, imagings as discussed above. I reviewed all nursing notes, pharmacy notes, vitals, pertinent old records I have discussed plan of care as described above with RN and patient/family.  CBC: Recent Labs  Lab 05/18/24 1940 05/18/24 2346 05/19/24 0145  WBC 18.8*  --  21.6*  NEUTROABS 13.9*  --   --   HGB 13.7 15.3 13.4  HCT 42.2 45.0 39.9  MCV 92.7  --  90.3  PLT 421*  --  442*   Basic Metabolic Panel: Recent Labs  Lab 05/18/24 2320 05/18/24 2346 05/19/24 0145  NA 137 135 134*  K 4.5 4.4 4.1  CL 99 99 97*  CO2 26  --  24  GLUCOSE 191* 190* 189*  BUN 22* 24* 22*  CREATININE 1.07 1.00 1.05  CALCIUM 9.1  --  9.2  MG 2.3  --   --   PHOS  --   --  4.5    Studies: EEG adult Result Date: 05/19/2024 Gregg Lek, MD     05/19/2024  2:39 PM Patient Name: Lucas Yu MRN: [id number] Epilepsy Attending: Lek Gregg Referring Physician/Provider: No ref. provider found     Date: 05/19/2024 Duration: 23 minutes Patient history: 58 year old man with seizure like activity. EEG for further evaluation Level of alertness: Awake, drowsy AEDs during EEG study: Technical aspects: This EEG study was done with scalp electrodes positioned according to the 10-20 International system of electrode placement. Electrical activity was reviewed with band pass filter of 1-70Hz , sensitivity of 7 uV/mm, display speed of 22mm/sec with a 60Hz  notched filter applied as appropriate. EEG data were recorded continuously and digitally stored.  Video monitoring was available and reviewed as appropriate. Description: The posterior dominant rhythm consists of 9-10 Hz activity of moderate voltage (25-35 uV) seen predominantly in posterior head regions, symmetric and reactive to eye opening and eye closing. Drowsiness was characterized by attenuation of the posterior  background rhythm. Sleep was not seen. Hyperventilation and photic stimulation were not performed.   IMPRESSION: This study is within normal limits. No seizures or epileptiform discharges were seen throughout the recording. A normal interictal EEG does not exclude nor support the diagnosis of epilepsy. Lek Gregg MD Neurology    CT HEAD CODE STROKE WO CONTRAST (LKW 0-4.5h, LVO 0-24h) Result Date: 05/18/2024 EXAM: CT HEAD WITHOUT 05/18/2024 07:17:58 PM TECHNIQUE: CT of the head was performed without the administration of intravenous contrast. Automated exposure control, iterative reconstruction, and/or weight based adjustment of the mA/kV was utilized to reduce the radiation dose to as low as reasonably achievable. COMPARISON: Comparison made with prior MRI from 05/10/2024. CLINICAL HISTORY: Neuro deficit, acute, stroke suspected. FINDINGS: BRAIN AND VENTRICLES: Previously identified intracranial metastases involving the right frontal and left parietal lobes, again seen, and grossly similar to previous. Surrounding vasogenic edema without midline shift,  also similar. No visible new lesions on this noncontrast examination. No acute intracranial hemorrhage. No evidence of acute infarct. No hydrocephalus. No extra-axial fluid collection. Calcified atherosclerosis present about the skull base. ORBITS: No acute abnormality. SINUSES AND MASTOIDS: No acute abnormality. SOFT TISSUES AND SKULL: No acute skull fracture. No acute soft tissue abnormality. IMPRESSION: 1. No acute intracranial abnormality. 2. Aspects is 10. 3. Stable intracranial metastases involving the right frontal and left parietal lobes with surrounding vasogenic edema, without midline shift, similar to prior MRI from 05/10/2024. 4. Findings communicated by text page to Dr. Michaela at 7:28 pm on 05/18/2024. Electronically signed by: Morene Hoard MD 05/18/2024 07:31 PM EST RP Workstation: [id number]    Scheduled Meds:  dexamethasone   (DECADRON ) injection  4 mg Intravenous Q8H   enoxaparin  (LOVENOX ) injection  40 mg Subcutaneous Daily   levETIRAcetam   1,500 mg Intravenous BID   nicotine   21 mg Transdermal Daily   pantoprazole  (PROTONIX ) IV  40 mg Intravenous Q12H   Followed by   NOREEN ON 05/21/2024] pantoprazole   40 mg Oral Daily   phenytoin  (DILANTIN ) IV  100 mg Intravenous Q8H   sodium chloride  flush  3 mL Intravenous Q12H   Continuous Infusions:  sodium chloride      PRN Meds: acetaminophen  **OR** acetaminophen , haloperidol  lactate, ondansetron  **OR** ondansetron  (ZOFRAN ) IV  Time spent: 55 minutes  Author: ELVAN SOR. MD [facility] 05/19/2024 5:17 PM  To reach On-call, see care teams to locate the attending and reach out to them via [email or web address]. If 7PM-7AM, please contact night-coverage If you still have difficulty reaching the attending provider, please page the [facility] (Director on Call) for [facility] on amion for assistance.

## 2024-05-20 ENCOUNTER — Other Ambulatory Visit (HOSPITAL_COMMUNITY): Payer: Self-pay

## 2024-05-20 DIAGNOSIS — G40901 Epilepsy, unspecified, not intractable, with status epilepticus: Secondary | ICD-10-CM | POA: Diagnosis not present

## 2024-05-20 LAB — CBC
HCT: 42.3 % (ref 39.0–52.0)
Hemoglobin: 14.1 g/dL (ref 13.0–17.0)
MCH: 30.2 pg (ref 26.0–34.0)
MCHC: 33.3 g/dL (ref 30.0–36.0)
MCV: 90.6 fL (ref 80.0–100.0)
Platelets: 417 K/uL — ABNORMAL HIGH (ref 150–400)
RBC: 4.67 MIL/uL (ref 4.22–5.81)
RDW: 14.3 % (ref 11.5–15.5)
WBC: 14.2 K/uL — ABNORMAL HIGH (ref 4.0–10.5)
nRBC: 0 % (ref 0.0–0.2)

## 2024-05-20 LAB — BASIC METABOLIC PANEL WITH GFR
Anion gap: 11 (ref 5–15)
BUN: 21 mg/dL — ABNORMAL HIGH (ref 6–20)
CO2: 23 mmol/L (ref 22–32)
Calcium: 9.4 mg/dL (ref 8.9–10.3)
Chloride: 98 mmol/L (ref 98–111)
Creatinine, Ser: 1.01 mg/dL (ref 0.61–1.24)
GFR, Estimated: >60 mL/min (ref >60)
Glucose, Bld: 221 mg/dL — ABNORMAL HIGH (ref 70–99)
Potassium: 4.2 mmol/L (ref 3.5–5.1)
Sodium: 132 mmol/L — ABNORMAL LOW (ref 135–145)

## 2024-05-20 LAB — PHOSPHORUS: Phosphorus: 4 mg/dL (ref 2.5–4.6)

## 2024-05-20 LAB — MAGNESIUM: Magnesium: 2.1 mg/dL (ref 1.7–2.4)

## 2024-05-20 MED ORDER — GLUCERNA SHAKE PO LIQD
237.0000 mL | Freq: Three times a day (TID) | ORAL | Status: DC
  Administered 2024-05-20: 237 mL via ORAL

## 2024-05-20 MED ORDER — PHENYTOIN SODIUM EXTENDED 100 MG PO CAPS
100.0000 mg | ORAL_CAPSULE | Freq: Three times a day (TID) | ORAL | 11 refills | Status: DC
  Filled 2024-05-20: qty 90, 30d supply, fill #0

## 2024-05-20 MED ORDER — ACETAMINOPHEN 325 MG PO TABS
325.0000 mg | ORAL_TABLET | Freq: Once | ORAL | Status: AC
  Administered 2024-05-20: 325 mg via ORAL
  Filled 2024-05-20: qty 1

## 2024-05-20 MED ORDER — VITAMIN B-12 1000 MCG PO TABS: 1000.0000 ug | ORAL_TABLET | Freq: Every day | ORAL | Status: DC

## 2024-05-20 MED ORDER — CYANOCOBALAMIN 1000 MCG PO TABS
1000.0000 ug | ORAL_TABLET | Freq: Every day | ORAL | 2 refills | Status: DC
  Filled 2024-05-20: qty 30, 30d supply, fill #0

## 2024-05-20 MED ORDER — PHENYTOIN 50 MG PO CHEW: 150.0000 mg | CHEWABLE_TABLET | Freq: Once | ORAL | Status: DC

## 2024-05-20 MED ORDER — LEVETIRACETAM 750 MG PO TABS
1500.0000 mg | ORAL_TABLET | Freq: Two times a day (BID) | ORAL | 11 refills | Status: DC
  Filled 2024-05-20: qty 120, 30d supply, fill #0

## 2024-05-20 MED ORDER — CYANOCOBALAMIN 1000 MCG/ML IJ SOLN
1000.0000 ug | Freq: Every day | INTRAMUSCULAR | Status: DC
  Administered 2024-05-20: 1000 ug via INTRAMUSCULAR
  Filled 2024-05-20: qty 1

## 2024-05-20 MED ORDER — PHENYTOIN 50 MG PO CHEW
150.0000 mg | CHEWABLE_TABLET | Freq: Two times a day (BID) | ORAL | 2 refills | Status: DC
  Filled 2024-05-20: qty 24, 4d supply, fill #0

## 2024-05-20 NOTE — Care Management (Signed)
  Transition of Care [facility]) Screening Note   Patient Details  Name: Lucas Yu Date of Birth: 1966-05-16   Transition of Care [facility]) CM/SW Contact:    Corean JAYSON Canary, RN Phone Number: 05/20/2024, 11:27 AM    Transition of Care Department [facility]) has reviewed patient and no TOC needs have been identified at this time. We will continue to monitor patient advancement through interdisciplinary progression rounds. If new patient transition needs arise, please place a TOC consult.

## 2024-05-20 NOTE — Discharge Summary (Signed)
 [facility] Discharge Summary   Patient: Lucas Yu FMW:[id number]  PCP: [facility]  Date of admission: 05/18/2024   Date of discharge:  05/20/2024     Discharge Diagnoses:  Principal Problem:   Status epilepticus [facility]) Active Problems:   Malignant neoplasm metastatic to brain [facility])   Admitted From: Home Disposition:  Home   Recommendations for Outpatient Follow-up:  F/u with PCP in 1 wk F/u with Neuro and Oncologist in 1 wk No driving until cleared by Neurology out pt Follow up LABS/TEST:     Follow-up Information     [facility] Follow up in 1 week(s).   Contact information: [address] [place] [address] [phone]         Sherrod Sherrod, MD Follow up in 1 week(s).   Specialty: Oncology Contact information: [address] [place] [address] [phone]         Gregg Lek, MD Follow up in 1 week(s).   Specialty: Neurology Contact information: [address] [place] [address] [phone]                Diet recommendation: Regular diet  Activity: The patient is advised to gradually reintroduce usual activities, as tolerated  Discharge Condition: stable  Code Status: Full code   History of present illness: As per the H and P dictated on admission.  Hospital Course:  Lucas Yu is a 58 y.o. 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   Patient was apparently in his usual state until approximately 6:40 PM when he was noted to have difficulty speaking and twitching of the left face and arm.  He was brought into the ED as a code stroke.   [facility] ED Course: Upon arrival to the ED, patient was found to be afebrile and saturating well on room air with normal HR and stable BP.  Labs are most  notable for WBC 18,800 and platelets 421,000.  CMP and magnesium have been ordered but not yet collected.  Head CT is negative for acute findings but notable for stable metastases and surrounding vasogenic edema.   Patient was evaluated by neurology, treated with 4 mg IV Ativan , 10 mg IV Decadron , 60 mg/kg IV Keppra , and 20 PE/kg fosphenytoin  in the ED.  He was transferred to [facility] for EEG.   Assessment and Plan:   # Focal status epilepticus  - Presents with left arm and facial twitching, has stable head CT with no ICH, was evaluated by neurology, given 4 mg IV Ativan , 10 mg IV Decadron , 60 mg/kg Keppra , and 20 PE/kg fosphenytoin . S/p seizure precautions,  Decadron  4 mg 3 times daily, Keppra  1500 twice daily, and phenytoin  100 3 times daily. PPI for GI prophylaxis S/p IV fluid for hydration.  Remained stable, no seizures during hospital stay. EEG: Negative for epilepsy. Neurology consulted: recommended to discharge on Keppra  1500 mg p.o. twice daily, phenytoin  150 mg p.o. twice daily and Decadron  4 mg p.o. 3 times daily resumed as per oncology.  Continued PPI.   # Non-small cell lung cancer - Recently diagnosed stage IV NSCLC with adrenal and brain mets, undergoing radiation therapy to brain and molecular marker testing to guide additional therapy under the care of Dr. Sherrod  - Continue Decadron  4 mg 3 times a day  Follow with oncologist as an outpatient for continued management of lung cancer with metastasis.   There is no height or weight on file to calculate BMI.  Nutrition Interventions:  - Patient was instructed, not to drive, operate heavy machinery, perform activities at heights, swimming or participation in water activities or provide baby sitting services while on Pain, Sleep and Anxiety Medications; until his outpatient Physician has advised to do so again.  - Also recommended to not to take more than prescribed Pain, Sleep and Anxiety Medications.  Patient was  ambulatory without any assistance.  On the day of the discharge the patient's vitals were stable, and no other acute medical condition were reported by patient. the patient was felt safe to be discharge at Home.  Consultants: Neurology Procedures: EEG  Discharge Exam: General: Appear in no distress, Oral Mucosa Clear, moist. Cardiovascular: S1 and S2 Present, no Murmur, Respiratory: normal respiratory effort, Bilateral Air entry present and no Crackles, no wheezes Abdomen: Bowel Sound present, Soft and no tenderness. Extremities: no Pedal edema, no calf tenderness Neurology: alert and oriented to time, place, and person affect appropriate.  There were no vitals filed for this visit. Vitals:   05/20/24 0421 05/20/24 0758  BP: (!) 130/96 (!) 156/99  Pulse: 71 74  Resp:  18  Temp: 97.8 F (36.6 C) 97.8 F (36.6 C)  SpO2: 100% 100%    DISCHARGE MEDICATION: Allergies as of 05/20/2024       Reactions   Penicillins Hives        Medication List     TAKE these medications    cetirizine 10 MG tablet Commonly known as: ZYRTEC Take 10 mg by mouth daily as needed for allergies.   cyanocobalamin 1000 MCG tablet Take 1 tablet (1,000 mcg total) by mouth daily. Start taking on: May 27, 2024   cyclobenzaprine  10 MG tablet Commonly known as: FLEXERIL  Take 10 mg by mouth 3 (three) times daily as needed for muscle spasms.   dexamethasone  4 MG tablet Commonly known as: DECADRON  Take 1 tablet (4 mg total) by mouth 3 (three) times daily.   fluconazole  100 MG tablet Commonly known as: DIFLUCAN  Take 2 tablets today, then 1 tablet daily x 20 more days.   gabapentin  400 MG capsule Commonly known as: NEURONTIN  Take 1 capsule (400 mg total) by mouth daily as needed (for pain).   levETIRAcetam  750 MG tablet Commonly known as: Keppra  Take 2 tablets (1,500 mg total) by mouth 2 (two) times daily.   LORazepam  0.5 MG tablet Commonly known as: ATIVAN  Take 1 tablet (0.5 mg  total) by mouth 20 minutes prior to PET scan. Do not drive after taking medication.   methocarbamol 500 MG tablet Commonly known as: ROBAXIN One twice daily prn for muscle spasm   naproxen  500 MG 24 hr tablet Commonly known as: NAPRELAN  Take 500 mg by mouth as needed (As needed for pain up to twice a day).   nicotine  7 mg/24hr patch Commonly known as: NICODERM CQ  - dosed in mg/24 hr Place 1 patch (7 mg total) onto the skin daily.   nicotine  21 mg/24hr patch Commonly known as: NICODERM CQ  - dosed in mg/24 hours Place 1 patch (21 mg total) onto the skin daily. Apply 21 mg patch daily x 6 wk, then 14mg  patch daily x 2 wk, then 7 mg patch daily x 2 wk   nicotine  14 mg/24hr patch Commonly known as: NICODERM CQ  - dosed in mg/24 hours Place 1 patch (14 mg total)  onto the skin daily. Apply 21 mg patch daily x 6 wk, then 14mg  patch daily x 2 wk, then 7 mg patch daily x 2 wk   nicotine  7 mg/24hr patch Commonly known as: NICODERM CQ  - dosed in mg/24 hr Place 1 patch (7 mg total) onto the skin daily. Apply 21 mg patch daily x 6 wk, then 14mg  patch daily x 2 wk, then 7 mg patch daily x 2 wk   pantoprazole  40 MG tablet Commonly known as: Protonix  Take 1 tablet (40 mg total) by mouth daily.   phenytoin  100 MG ER capsule Commonly known as: Dilantin  Take 1 capsule (100 mg total) by mouth 3 (three) times daily.   polyethylene glycol powder 17 GM/SCOOP powder Commonly known as: GLYCOLAX /MIRALAX  Take 17 g by mouth 2 (two) times daily. Dissolve 1 capful (17g) in 4-8 ounces of liquid and take by mouth daily.   pregabalin  25 MG capsule Commonly known as: LYRICA  Take 25 mg by mouth every 8 (eight) hours.   sucralfate  1 g tablet Commonly known as: Carafate  Take 1 tablet (1 g total) by mouth 4 (four) times daily for 15 days.   tadalafil 20 MG tablet Commonly known as: CIALIS Take 20 mg by mouth once as needed for erectile dysfunction.       Allergies  Allergen Reactions   Penicillins  Hives   Discharge Instructions     Call MD for:   Complete by: As directed    Seizures   Call MD for:  difficulty breathing, headache or visual disturbances   Complete by: As directed    Call MD for:  extreme fatigue   Complete by: As directed    Call MD for:  persistant dizziness or light-headedness   Complete by: As directed    Call MD for:  persistant nausea and vomiting   Complete by: As directed    Call MD for:  severe uncontrolled pain   Complete by: As directed    Call MD for:  temperature >100.4   Complete by: As directed    Diet - low sodium heart healthy   Complete by: As directed    Diet Carb Modified   Complete by: As directed    Discharge instructions   Complete by: As directed    F/u with PCP in 1 wk F/u with Neuro and Oncologist in 1 wk No driving until cleared by Neurology out pt   Increase activity slowly   Complete by: As directed        The results of significant diagnostics from this hospitalization (including imaging, microbiology, ancillary and laboratory) are listed below for reference.    Significant Diagnostic Studies: EEG adult Result Date: 05/19/2024 Gregg Lek, MD     05/19/2024  2:39 PM Patient Name: OLUWATOBI VISSER MRN: [id number] Epilepsy Attending: Lek Gregg Referring Physician/Provider: No ref. provider found     Date: 05/19/2024 Duration: 23 minutes Patient history: 58 year old man with seizure like activity. EEG for further evaluation Level of alertness: Awake, drowsy AEDs during EEG study: Technical aspects: This EEG study was done with scalp electrodes positioned according to the 10-20 International system of electrode placement. Electrical activity was reviewed with band pass filter of 1-70Hz , sensitivity of 7 uV/mm, display speed of 77mm/sec with a 60Hz  notched filter applied as appropriate. EEG data were recorded continuously and digitally stored.  Video monitoring was available and reviewed as appropriate. Description: The posterior  dominant rhythm consists of 9-10 Hz activity of moderate voltage (  25-35 uV) seen predominantly in posterior head regions, symmetric and reactive to eye opening and eye closing. Drowsiness was characterized by attenuation of the posterior background rhythm. Sleep was not seen. Hyperventilation and photic stimulation were not performed.   IMPRESSION: This study is within normal limits. No seizures or epileptiform discharges were seen throughout the recording. A normal interictal EEG does not exclude nor support the diagnosis of epilepsy. Pastor Falling MD Neurology    CT HEAD CODE STROKE WO CONTRAST (LKW 0-4.5h, LVO 0-24h) Result Date: 05/18/2024 EXAM: CT HEAD WITHOUT 05/18/2024 07:17:58 PM TECHNIQUE: CT of the head was performed without the administration of intravenous contrast. Automated exposure control, iterative reconstruction, and/or weight based adjustment of the mA/kV was utilized to reduce the radiation dose to as low as reasonably achievable. COMPARISON: Comparison made with prior MRI from 05/10/2024. CLINICAL HISTORY: Neuro deficit, acute, stroke suspected. FINDINGS: BRAIN AND VENTRICLES: Previously identified intracranial metastases involving the right frontal and left parietal lobes, again seen, and grossly similar to previous. Surrounding vasogenic edema without midline shift, also similar. No visible new lesions on this noncontrast examination. No acute intracranial hemorrhage. No evidence of acute infarct. No hydrocephalus. No extra-axial fluid collection. Calcified atherosclerosis present about the skull base. ORBITS: No acute abnormality. SINUSES AND MASTOIDS: No acute abnormality. SOFT TISSUES AND SKULL: No acute skull fracture. No acute soft tissue abnormality. IMPRESSION: 1. No acute intracranial abnormality. 2. Aspects is 10. 3. Stable intracranial metastases involving the right frontal and left parietal lobes with surrounding vasogenic edema, without midline shift, similar to prior MRI from  05/10/2024. 4. Findings communicated by text page to Dr. Michaela at 7:28 pm on 05/18/2024. Electronically signed by: Morene Hoard MD 05/18/2024 07:31 PM EST RP Workstation: [id number]   VAS US  LOWER EXTREMITY VENOUS (DVT) Result Date: 05/11/2024  Lower Venous DVT Study Patient Name:  MARTE CELANI  Date of Exam:   05/11/2024 Medical Rec #: [id number]      Accession #:    [id number] Date of Birth: [birth year]      Patient Gender: M Patient Age:   28 years Exam Location:  [facility] Procedure:      VAS US  LOWER EXTREMITY VENOUS (DVT) Referring Phys: LEEROY DUE --------------------------------------------------------------------------------  Indications: Pain.  Risk Factors: Cancer. Comparison Study: No prior studies. Performing Technologist: Cordella Collet RVT  Examination Guidelines: A complete evaluation includes B-mode imaging, spectral Doppler, color Doppler, and power Doppler as needed of all accessible portions of each vessel. Bilateral testing is considered an integral part of a complete examination. Limited examinations for reoccurring indications may be performed as noted. The reflux portion of the exam is performed with the patient in reverse Trendelenburg.  +---------+---------------+---------+-----------+----------+--------------+ RIGHT    CompressibilityPhasicitySpontaneityPropertiesThrombus Aging +---------+---------------+---------+-----------+----------+--------------+ CFV      Full           Yes      Yes                                 +---------+---------------+---------+-----------+----------+--------------+ SFJ      Full                                                        +---------+---------------+---------+-----------+----------+--------------+ FV Prox  Full                                                        +---------+---------------+---------+-----------+----------+--------------+  FV Mid   Full                                                         +---------+---------------+---------+-----------+----------+--------------+ FV DistalFull                                                        +---------+---------------+---------+-----------+----------+--------------+ PFV      Full                                                        +---------+---------------+---------+-----------+----------+--------------+ POP      Full           Yes      Yes                                 +---------+---------------+---------+-----------+----------+--------------+ PTV      Full                                                        +---------+---------------+---------+-----------+----------+--------------+ PERO     Full                                                        +---------+---------------+---------+-----------+----------+--------------+   +---------+---------------+---------+-----------+----------+--------------+ LEFT     CompressibilityPhasicitySpontaneityPropertiesThrombus Aging +---------+---------------+---------+-----------+----------+--------------+ CFV      Full           Yes      Yes                                 +---------+---------------+---------+-----------+----------+--------------+ SFJ      Full                                                        +---------+---------------+---------+-----------+----------+--------------+ FV Prox  Full                                                        +---------+---------------+---------+-----------+----------+--------------+ FV Mid   Full                                                        +---------+---------------+---------+-----------+----------+--------------+  FV DistalFull                                                        +---------+---------------+---------+-----------+----------+--------------+ PFV      Full                                                         +---------+---------------+---------+-----------+----------+--------------+ POP      Full           Yes      Yes                                 +---------+---------------+---------+-----------+----------+--------------+ PTV      Full                                                        +---------+---------------+---------+-----------+----------+--------------+ PERO     Full                                                        +---------+---------------+---------+-----------+----------+--------------+     Summary: RIGHT: - There is no evidence of deep vein thrombosis in the lower extremity.  - No cystic structure found in the popliteal fossa.  LEFT: - There is no evidence of deep vein thrombosis in the lower extremity.  - No cystic structure found in the popliteal fossa.  *See table(s) above for measurements and observations. Electronically signed by Lonni Gaskins MD on 05/11/2024 at 3:26:26 PM.    Final    MR Brain W Wo Contrast Result Date: 05/11/2024 EXAM: MRI BRAIN WITH AND WITHOUT CONTRAST 05/10/2024 04:46:18 PM TECHNIQUE: Multiplanar multisequence MRI of the head/brain was performed with and without the administration of intravenous contrast. CONTRAST: 7 mL of Gadavist . COMPARISON: MR Head 05/03/2024. CLINICAL HISTORY: 58 year old male with brain metastases, pretreatment planning. FINDINGS: BRAIN AND VENTRICLES: Subcentimeter left superior cerebellar enhancing metastasis measures 3 to 4 mm on series 1100 image 124. Left superior occipital versus inferior parietal lobe thick rim enhancing metastasis is 12 mm on series 1100 image 172. More solid round and mildly lobulated enhancing metastasis in the posterior right frontal lobe is 18 mm on series 1100 image 198. Punctate enhancing metastasis in the anterior left frontal lobe is visible on both series 1100 image 204 and series 9 image 15. No other brain metastases identified. Vasogenic edema in the posterior right frontal lobe and  the posterior left hemisphere is stable. There is no significant midline shift or intracranial mass effect. Normal underlying brain volume. No convincing intracranial hemorrhage. No acute infarct. No hydrocephalus. The sella is unremarkable. Normal flow voids. No dural thickening identified. Negative visible cervical spine and spinal cord. ORBITS: No acute abnormality. SINUSES: No acute abnormality. BONES AND SOFT TISSUES: Visible bone marrow signal is within  normal limits. Negative visible cervical spine. No acute soft tissue abnormality. IMPRESSION: 1. Total of four enhancing brain metastases on 3T ranging from punctate (anterior left frontal) to 18 mm (posterior right frontal), all annotated on series 1100. 2. Stable vasogenic edema in the posterior right frontal lobe and posterior left hemisphere. No significant intracranial mass effect. Electronically signed by: Helayne Hurst MD 05/11/2024 09:48 AM EST RP Workstation: [id number]   DG CHEST PORT 1 VIEW Result Date: 05/05/2024 CLINICAL DATA:  Post bronchoscopy with biopsy. EXAM: PORTABLE CHEST 1 VIEW COMPARISON:  05/03/2024 and CT 05/03/2024 FINDINGS: Lungs are hypoinflated and demonstrate evidence of patient's known right upper lobe nodule which is slightly less well-defined compatible recent bronchoscopy and biopsy. No pneumothorax. Remainder of the lungs are clear. Cardiomediastinal silhouette and remainder of the exam is unchanged. IMPRESSION: Hypoinflation with known right upper lobe nodule. No pneumothorax. Electronically Signed   By: Toribio Agreste M.D.   On: 05/05/2024 13:30   DG C-ARM BRONCHOSCOPY Result Date: 05/05/2024 C-ARM BRONCHOSCOPY: Fluoroscopy was utilized by the requesting physician.  No radiographic interpretation.   MR ABDOMEN W WO CONTRAST Result Date: 05/04/2024 EXAM: MRCP WITH AND WITHOUT IV CONTRAST 05/04/2024 08:24:35 AM TECHNIQUE: Multisequence, multiplanar magnetic resonance images of the abdomen with and without intravenous  contrast. 7 mL gadobutrol  (GADAVIST ) 1 MMOL/ML injection was administered. MRCP sequences were performed. COMPARISON: Chest CT 05/03/2024. CLINICAL HISTORY: Lung mass. FINDINGS: LIVER: No enhancing lesion within the liver to suggest a hepatocellular carcinoma. No enhancing lesion to suggest metastatic disease. GALLBLADDER AND BILIARY SYSTEM: Gallbladder is unremarkable. No intrahepatic or extrahepatic ductal dilation. SPLEEN: Unremarkable. PANCREAS/PANCREATIC DUCT: Visualized pancreas is unremarkable. No pancreatic ductal dilatation. ADRENAL GLANDS: Masslike enlargement of the right adrenal gland to 3.6 x 2.3 cm on image 34 of series 802. This right adrenal mass has a suspicious enhancement pattern for metastatic disease. Similar findings in the left adrenal gland which is enlarged to 2.1 x 1.6 cm. KIDNEYS: Unremarkable. LYMPH NODES: No enlarged abdominal lymph nodes. VASCULATURE: Unremarkable. PERITONEUM: No ascites. ABDOMINAL WALL: No hernia. No mass. BOWEL: Grossly unremarkable. No bowel obstruction. BONES: No acute abnormality or worrisome osseous lesion. SOFT TISSUES: Unremarkable. MISCELLANEOUS: Unremarkable. IMPRESSION: 1. No evidence of hepatocellular carcinoma or metastatic disease in the liver. 2. 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 Electronically signed by: Norleen Boxer MD 05/04/2024 09:30 AM EST RP Workstation: [id number]   CT CHEST W CONTRAST Result Date: 05/04/2024 CLINICAL DATA:  Pulmonary lesion on chest x-ray concerning for neoplasm. * Tracking Code: BO * EXAM: CT CHEST WITH CONTRAST TECHNIQUE: Multidetector CT imaging of the chest was performed during intravenous contrast administration. RADIATION DOSE REDUCTION: This exam was performed according to the departmental dose-optimization program which includes automated exposure control, adjustment of the mA and/or kV according to patient size and/or use of iterative reconstruction technique. CONTRAST:  50mL  OMNIPAQUE  IOHEXOL  350 MG/ML SOLN COMPARISON:  Chest x-ray earlier same day.  Chest CT 04/04/2020 FINDINGS: Cardiovascular: The heart size is normal. No substantial pericardial effusion. No thoracic aortic aneurysm. Mediastinum/Nodes: 1.7 cm short axis necrotic precarinal lymph node evident. 1.8 cm short axis necrotic right hilar lymph node. No left hilar lymphadenopathy. The esophagus has normal imaging features. There is no axillary lymphadenopathy. Lungs/Pleura: 3.1 x 3.5 x 3.4 cm right upper lobe pulmonary mass is tethered to the peripheral pleura. No other suspicious pulmonary nodule or mass. No pulmonary edema or pleural effusion. Centrilobular and paraseptal emphysema evident. Upper Abdomen: 3.7 x 2.8 x 6.7 cm heterogeneously enhancing right  adrenal mass evident. 1.9 x 1.8 x 1.8 cm left adrenal nodule. Musculoskeletal: No worrisome lytic or sclerotic osseous abnormality. IMPRESSION: 1. 3.1 x 3.5 x 3.4 cm right upper lobe pulmonary mass is tethered to the peripheral pleura. Imaging features are compatible with primary bronchogenic neoplasm. 2. Necrotic right hilar and precarinal lymphadenopathy consistent with metastatic disease. 3. Bilateral adrenal nodules, right larger than left, consistent with metastatic disease. 4.  Emphysema (ICD10-J43.9). Electronically Signed   By: Camellia Candle M.D.   On: 05/04/2024 05:39   MR Brain W and Wo Contrast Result Date: 05/03/2024 EXAM: MRI BRAIN WITH AND WITHOUT CONTRAST 05/03/2024 02:05:09 PM TECHNIQUE: Multiplanar multisequence MRI of the head/brain was performed with and without the administration of intravenous contrast. 7 mL (gadobutrol  (GADAVIST ) 1 MMOL/ML injection 7 mL GADOBUTROL  1 MMOL/ML IV SOLN). COMPARISON: CT head 05/03/2024. CLINICAL HISTORY: Brain/CNS neoplasm, staging; Neuro deficit, acute, stroke suspected. Slurred speech, facial droop, dysphagia, and left sided numbness/weakness. FINDINGS: LIMITATIONS: The examination is intermittently up to moderately  motion degraded. BRAIN AND VENTRICLES: Peripherally enhancing lesions measure 1.7 x 1.3 cm in the posterior right frontal lobe and 1.0 x 0.8 x 1.0 cm in the left parietal lobe. There is moderate associated vasogenic edema, greater in the posterior right frontal lobe, with associated sulcal effacement and slight mass effect on the right greater than left lateral ventricles. There is no midline shift. A punctate focus of susceptibility associated with the right frontal lesion may reflect minimal blood products or mineralization. There is also a punctate 1.5-2 mm enhancing lesion in the superior left cerebellar hemisphere (series 18 image 48 and series 20 image 15) without edema. There is no restricted diffusion associated with these lesions. No acute infarct, hydrocephalus, or extra axial fluid collection is evident. Cerebral volume is normal. Major intracranial vascular flow voids are preserved. ORBITS: No acute abnormality. SINUSES: No acute abnormality. BONES AND SOFT TISSUES: Normal bone marrow signal and enhancement. Small right frontal scalp lipoma. IMPRESSION: 1. Enhancing lesions with edema in the right frontal and left parietal lobes as well as a punctate lesion in the left cerebellum, most consistent with metastatic disease. Electronically signed by: Dasie Hamburg MD 05/03/2024 03:24 PM EST RP Workstation: [id number]   DG Chest Port 1 View Result Date: 05/03/2024 EXAM: 1 VIEW(S) XRAY OF THE CHEST 05/03/2024 02:15:00 PM COMPARISON: Chest CT 04/04/2020. CLINICAL HISTORY: Brain lesions, slurred speech, weakness, and dysphasia. Possible metastases in brain. FINDINGS: LUNGS AND PLEURA: 3.2 cm right upper lobe mass. No pulmonary edema. No pleural effusion. No pneumothorax. HEART AND MEDIASTINUM: No acute abnormality of the cardiac and mediastinal silhouettes. BONES AND SOFT TISSUES: No acute osseous abnormality. IMPRESSION: 1. Right upper lobe pulmonary mass measuring 3.2 cm, suspicious for primary lung  neoplasm; recommend dedicated chest CT for further characterization and staging evaluation, and oncologic referral. Electronically signed by: Maude Stammer MD 05/03/2024 02:29 PM EST RP Workstation: [id number]   CT HEAD WO CONTRAST Result Date: 05/03/2024 EXAM: CT HEAD WITHOUT CONTRAST 05/03/2024 12:06:07 PM TECHNIQUE: CT of the head was performed without the administration of intravenous contrast. Automated exposure control, iterative reconstruction, and/or weight based adjustment of the mA/kV was utilized to reduce the radiation dose to as low as reasonably achievable. COMPARISON: None available. CLINICAL HISTORY: Neuro deficit, acute, stroke suspected. FINDINGS: BRAIN AND VENTRICLES: Hyperdense masses in the posterior right frontal lobe and left parietal lobe measure 1.3 cm and 1.0 cm, respectively. Associated moderate vasogenic edema is greater in the right frontal lobe. There is mild regional mass effect  without significant midline shift. Elsewhere, no acute infarct, intracranial hemorrhage, hydrocephalus, or extra-axial fluid collection is identified. Cerebral volume is normal. ORBITS: No acute abnormality. SINUSES: No acute abnormality. SOFT TISSUES AND SKULL: No acute soft tissue abnormality. No skull fracture. IMPRESSION: 1. Right frontal and left parietal masses with moderate vasogenic edema. A brain MRI without and with IV contrast is recommended for further evaluation. Electronically signed by: Dasie Hamburg MD 05/03/2024 12:21 PM EST RP Workstation: [id number]   CT ABDOMEN PELVIS W CONTRAST Result Date: 04/21/2024 CLINICAL DATA:  Acute abdominal pain.  Nausea and vomiting. EXAM: CT ABDOMEN AND PELVIS WITH CONTRAST TECHNIQUE: Multidetector CT imaging of the abdomen and pelvis was performed using the standard protocol following bolus administration of intravenous contrast. RADIATION DOSE REDUCTION: This exam was performed according to the departmental dose-optimization program which includes  automated exposure control, adjustment of the mA and/or kV according to patient size and/or use of iterative reconstruction technique. CONTRAST:  OMNIPAQUE  IOHEXOL  300 MG/ML  SOLN COMPARISON:  None Available. FINDINGS: Lower Chest: No acute findings. Hepatobiliary:  No suspicious hepatic masses identified. Pancreas: Focal area of hyperenhancement is seen in segment 2 of the left hepatic lobe which is peripheral and somewhat wedge-shaped in appearance. This is suggestive of a vascular shunt, with hepatic mass considered less likely. Gallbladder is unremarkable. No evidence of biliary ductal dilatation. Spleen: Within normal limits in size and appearance. Adrenals/Urinary Tract: Heterogeneously enhancing right adrenal mass is seen measuring 4.7 x 2.1 cm. Normal left adrenal gland and kidneys. No evidence of ureteral calculi or hydronephrosis. Unremarkable unopacified urinary bladder. Stomach/Bowel: No evidence of obstruction, inflammatory process or abnormal fluid collections. Colonic diverticulosis noted, without signs of diverticulitis. Vascular/Lymphatic: No pathologically enlarged lymph nodes. No acute vascular findings. Reproductive:  No mass or other significant abnormality. Other:  None. Musculoskeletal:  No suspicious bone lesions identified. IMPRESSION: No acute findings. Colonic diverticulosis, without radiographic evidence of diverticulitis. Indeterminate right adrenal mass. Recommend abdomen MRI without and with contrast for further characterization. Focal area of hyperenhancement in left hepatic lobe, which is suggestive of a vascular shunt, with hepatic mass considered less likely. This can also be further characterized by MRI. Electronically Signed   By: Norleen DELENA Kil M.D.   On: 04/21/2024 12:13    Microbiology: No results found for this or any previous visit (from the past 240 hours).   Labs: CBC: Recent Labs  Lab 05/18/24 1940 05/18/24 2346 05/19/24 0145 05/20/24 0917  WBC 18.8*  --   21.6* 14.2*  NEUTROABS 13.9*  --   --   --   HGB 13.7 15.3 13.4 14.1  HCT 42.2 45.0 39.9 42.3  MCV 92.7  --  90.3 90.6  PLT 421*  --  442* 417*   Basic Metabolic Panel: Recent Labs  Lab 05/18/24 2320 05/18/24 2346 05/19/24 0145  NA 137 135 134*  K 4.5 4.4 4.1  CL 99 99 97*  CO2 26  --  24  GLUCOSE 191* 190* 189*  BUN 22* 24* 22*  CREATININE 1.07 1.00 1.05  CALCIUM 9.1  --  9.2  MG 2.3  --   --   PHOS  --   --  4.5   Liver Function Tests: Recent Labs  Lab 05/18/24 2320 05/19/24 0145  AST 27 26  ALT 20 18  ALKPHOS 82 81  BILITOT 0.6 0.5  PROT 7.2 6.0*  ALBUMIN 3.0* 3.0*   No results for input(s): LIPASE, AMYLASE in the last 168 hours. No results for input(s):  AMMONIA in the last 168 hours. Cardiac Enzymes: No results for input(s): CKTOTAL, CKMB, CKMBINDEX, TROPONINI in the last 168 hours. BNP (last 3 results) No results for input(s): BNP in the last 8760 hours. CBG: Recent Labs  Lab 05/18/24 1908  GLUCAP 121*    Time spent: 35 minutes  Signed:  Elvan Sor  [facility] 05/20/2024 10:58 AM

## 2024-05-20 NOTE — Plan of Care (Signed)
 dilantin  level is normal.  Con't dilantin  100mg  TID and Keppra  1,500 BID.  Decadron  4mg  TID.   F/U with oncology and neurology outpt.  No driving.

## 2024-05-23 ENCOUNTER — Emergency Department (HOSPITAL_COMMUNITY)
Admission: EM | Admit: 2024-05-23 | Discharge: 2024-05-23 | Discharge status: Against Medical Advice | Attending: Emergency Medicine | Admitting: Emergency Medicine

## 2024-05-23 ENCOUNTER — Emergency Department (HOSPITAL_COMMUNITY)

## 2024-05-23 ENCOUNTER — Encounter (HOSPITAL_COMMUNITY): Payer: Self-pay

## 2024-05-23 ENCOUNTER — Other Ambulatory Visit: Payer: Self-pay

## 2024-05-23 DIAGNOSIS — R52 Pain, unspecified: Secondary | ICD-10-CM | POA: Insufficient documentation

## 2024-05-23 DIAGNOSIS — Z5329 Procedure and treatment not carried out because of patient's decision for other reasons: Secondary | ICD-10-CM | POA: Insufficient documentation

## 2024-05-23 DIAGNOSIS — R569 Unspecified convulsions: Secondary | ICD-10-CM | POA: Insufficient documentation

## 2024-05-23 LAB — CBC
HCT: 40.6 % (ref 39.0–52.0)
Hemoglobin: 13.8 g/dL (ref 13.0–17.0)
MCH: 30.9 pg (ref 26.0–34.0)
MCHC: 34 g/dL (ref 30.0–36.0)
MCV: 90.8 fL (ref 80.0–100.0)
Platelets: 335 K/uL (ref 150–400)
RBC: 4.47 MIL/uL (ref 4.22–5.81)
RDW: 13.8 % (ref 11.5–15.5)
WBC: 11.8 K/uL — ABNORMAL HIGH (ref 4.0–10.5)
nRBC: 0 % (ref 0.0–0.2)

## 2024-05-23 LAB — COMPREHENSIVE METABOLIC PANEL WITH GFR
ALT: 20 U/L (ref 0–44)
AST: 32 U/L (ref 15–41)
Albumin: 3 g/dL — ABNORMAL LOW (ref 3.5–5.0)
Alkaline Phosphatase: 105 U/L (ref 38–126)
Anion gap: 14 (ref 5–15)
BUN: 25 mg/dL — ABNORMAL HIGH (ref 6–20)
CO2: 24 mmol/L (ref 22–32)
Calcium: 9.7 mg/dL (ref 8.9–10.3)
Chloride: 93 mmol/L — ABNORMAL LOW (ref 98–111)
Creatinine, Ser: 1.22 mg/dL (ref 0.61–1.24)
GFR, Estimated: >60 mL/min (ref >60)
Glucose, Bld: 106 mg/dL — ABNORMAL HIGH (ref 70–99)
Potassium: 4.7 mmol/L (ref 3.5–5.1)
Sodium: 131 mmol/L — ABNORMAL LOW (ref 135–145)
Total Bilirubin: 0.7 mg/dL (ref 0.0–1.2)
Total Protein: 7.7 g/dL (ref 6.5–8.1)

## 2024-05-23 LAB — URINALYSIS, ROUTINE W REFLEX MICROSCOPIC
Bilirubin Urine: NEGATIVE
Glucose, UA: NEGATIVE mg/dL
Hgb urine dipstick: NEGATIVE
Ketones, ur: NEGATIVE mg/dL
Leukocytes,Ua: NEGATIVE
Nitrite: NEGATIVE
Protein, ur: NEGATIVE mg/dL
Specific Gravity, Urine: 1.025 (ref 1.005–1.030)
pH: 5.5 (ref 5.0–8.0)

## 2024-05-23 LAB — I-STAT CHEM 8, ED
BUN: 26 mg/dL — ABNORMAL HIGH (ref 6–20)
Calcium, Ion: 1.2 mmol/L (ref 1.15–1.40)
Chloride: 96 mmol/L — ABNORMAL LOW (ref 98–111)
Creatinine, Ser: 1.3 mg/dL — ABNORMAL HIGH (ref 0.61–1.24)
Glucose, Bld: 109 mg/dL — ABNORMAL HIGH (ref 70–99)
HCT: 45 % (ref 39.0–52.0)
Hemoglobin: 15.3 g/dL (ref 13.0–17.0)
Potassium: 4.5 mmol/L (ref 3.5–5.1)
Sodium: 129 mmol/L — ABNORMAL LOW (ref 135–145)
TCO2: 23 mmol/L (ref 22–32)

## 2024-05-23 LAB — LIPASE, BLOOD: Lipase: 22 U/L (ref 11–51)

## 2024-05-23 LAB — I-STAT CG4 LACTIC ACID, ED: Lactic Acid, Venous: 1.3 mmol/L (ref 0.5–1.9)

## 2024-05-23 MED ORDER — HYDROMORPHONE HCL 1 MG/ML IJ SOLN
0.5000 mg | Freq: Once | INTRAMUSCULAR | Status: DC
  Filled 2024-05-23: qty 1

## 2024-05-23 MED ORDER — LORAZEPAM 2 MG/ML IJ SOLN
2.0000 mg | Freq: Once | INTRAMUSCULAR | Status: AC
  Administered 2024-05-23: 2 mg via INTRAMUSCULAR
  Filled 2024-05-23: qty 1

## 2024-05-23 MED ORDER — LEVETIRACETAM (KEPPRA) 500 MG/5 ML ADULT IV PUSH
1500.0000 mg | Freq: Once | INTRAVENOUS | Status: AC
  Administered 2024-05-23: 1500 mg via INTRAVENOUS
  Filled 2024-05-23: qty 15

## 2024-05-23 MED ORDER — ONDANSETRON HCL 4 MG/2ML IJ SOLN
4.0000 mg | Freq: Once | INTRAMUSCULAR | Status: AC
  Administered 2024-05-23: 4 mg via INTRAVENOUS
  Filled 2024-05-23: qty 2

## 2024-05-23 MED ORDER — SODIUM CHLORIDE 0.9 % IV SOLN: 300.0000 mg | Freq: Once | INTRAVENOUS | Status: DC

## 2024-05-23 MED ORDER — SODIUM CHLORIDE 0.9 % IV BOLUS
1000.0000 mL | Freq: Once | INTRAVENOUS | Status: AC
  Administered 2024-05-23: 1000 mL via INTRAVENOUS

## 2024-05-23 MED ORDER — SODIUM CHLORIDE 0.9 % IV SOLN
150.0000 mg | Freq: Once | INTRAVENOUS | Status: AC
  Administered 2024-05-23: 150 mg via INTRAVENOUS
  Filled 2024-05-23: qty 3

## 2024-05-23 NOTE — Discharge Instructions (Signed)
 Return for any problem.  ?

## 2024-05-23 NOTE — ED Triage Notes (Signed)
 C/o generalized bodyaches onset 12 mn states he hasn't been able to sleep all night , states he was recently dx. With brain tumor and was discharged from hospital on fr.

## 2024-05-23 NOTE — ED Notes (Signed)
Pt actively vomiting in triage 

## 2024-05-23 NOTE — ED Provider Notes (Signed)
 [place] EMERGENCY DEPARTMENT AT [facility] Provider Note   CSN: [id number] Arrival date & time: 05/23/24  9044     Patient presents with: Generalized Body Aches   Lucas TOWRY is a 58 y.o. male.  {Add pertinent medical, surgical, social history, OB history to HPI:5010} 58 year old male with prior medical history as detailed below presents for evaluation.  Patient complains of diffuse body aches.  Symptoms began overnight.  He denies fever.  He denies other complaint.  He reports compliance with the previously prescribed antiepileptics and Decadron  as started during last admission.  The history is provided by the patient and medical records.       Prior to Admission medications   Medication Sig Start Date End Date Taking? Authorizing Provider  cetirizine (ZYRTEC) 10 MG tablet Take 10 mg by mouth daily as needed for allergies. 03/08/24   [provider]  cyanocobalamin 1000 MCG tablet Take 1 tablet (1,000 mcg total) by mouth daily. 05/27/24 08/25/24  Von Bellis, MD  cyclobenzaprine  (FLEXERIL ) 10 MG tablet Take 10 mg by mouth 3 (three) times daily as needed for muscle spasms.    [provider]  dexamethasone  (DECADRON ) 4 MG tablet Take 1 tablet (4 mg total) by mouth 3 (three) times daily. 05/05/24 06/04/24  Hongalgi, Anand D, MD  fluconazole  (DIFLUCAN ) 100 MG tablet Take 2 tablets today, then 1 tablet daily x 20 more days. 05/17/24   Izell Domino, MD  gabapentin  (NEURONTIN ) 400 MG capsule Take 1 capsule (400 mg total) by mouth daily as needed (for pain). 05/05/24   Hongalgi, Anand D, MD  levETIRAcetam  (KEPPRA ) 750 MG tablet Take 2 tablets (1,500 mg total) by mouth 2 (two) times daily. 05/20/24 05/20/25  Von Bellis, MD  LORazepam  (ATIVAN ) 0.5 MG tablet Take 1 tablet (0.5 mg total) by mouth 20 minutes prior to PET scan. Do not drive after taking medication. 05/18/24   Heilingoetter, Cassandra L, PA-C  methocarbamol (ROBAXIN) 500 MG tablet One twice  daily prn for muscle spasm 04/19/24   [provider]  naproxen  (NAPRELAN ) 500 MG 24 hr tablet Take 500 mg by mouth as needed (As needed for pain up to twice a day).    [provider]  nicotine  (NICODERM CQ  - DOSED IN MG/24 HOURS) 14 mg/24hr patch Place 1 patch (14 mg total) onto the skin daily. Apply 21 mg patch daily x 6 wk, then 14mg  patch daily x 2 wk, then 7 mg patch daily x 2 wk 05/11/24   Wyatt Leeroy HERO, PA-C  nicotine  (NICODERM CQ  - DOSED IN MG/24 HOURS) 21 mg/24hr patch Place 1 patch (21 mg total) onto the skin daily. Apply 21 mg patch daily x 6 wk, then 14mg  patch daily x 2 wk, then 7 mg patch daily x 2 wk 05/11/24   Wyatt Leeroy HERO, PA-C  nicotine  (NICODERM CQ  - DOSED IN MG/24 HR) 7 mg/24hr patch Place 1 patch (7 mg total) onto the skin daily. 05/06/24   Hongalgi, Anand D, MD  nicotine  (NICODERM CQ  - DOSED IN MG/24 HR) 7 mg/24hr patch Place 1 patch (7 mg total) onto the skin daily. Apply 21 mg patch daily x 6 wk, then 14mg  patch daily x 2 wk, then 7 mg patch daily x 2 wk 05/11/24   Wyatt Leeroy HERO, PA-C  pantoprazole  (PROTONIX ) 40 MG tablet Take 1 tablet (40 mg total) by mouth daily. 04/21/24 04/21/25  Dorothyann Drivers, MD  phenytoin  (DILANTIN ) 50 MG tablet Chew 3 tablets (150 mg total)  by mouth 2 (two) times daily. 05/20/24 08/18/24  Nichola Zeke HERO, MD  polyethylene glycol powder (GLYCOLAX /MIRALAX ) 17 GM/SCOOP powder Take 17 g by mouth 2 (two) times daily. Dissolve 1 capful (17g) in 4-8 ounces of liquid and take by mouth daily. 05/05/24   Hongalgi, Anand D, MD  pregabalin  (LYRICA ) 25 MG capsule Take 25 mg by mouth every 8 (eight) hours. 03/01/24   [provider]  sucralfate  (CARAFATE ) 1 g tablet Take 1 tablet (1 g total) by mouth 4 (four) times daily for 15 days. 04/21/24 05/19/24  Paduchowski, Kevin, MD  tadalafil (CIALIS) 20 MG tablet Take 20 mg by mouth once as needed for erectile dysfunction. 03/28/24   [provider]    Allergies: Penicillins     Review of Systems  All other systems reviewed and are negative.   Updated Vital Signs BP (!) 138/107   Pulse 93   Temp (!) 97 F (36.1 C)   Resp 16   Ht 5' 7 (1.702 m)   Wt 72.6 kg   SpO2 100%   BMI 25.06 kg/m   Physical Exam Vitals and nursing note reviewed.  Constitutional:      General: He is not in acute distress.    Appearance: Normal appearance. He is well-developed.  HENT:     Head: Normocephalic and atraumatic.  Eyes:     Conjunctiva/sclera: Conjunctivae normal.     Pupils: Pupils are equal, round, and reactive to light.  Cardiovascular:     Rate and Rhythm: Normal rate and regular rhythm.     Heart sounds: Normal heart sounds.  Pulmonary:     Effort: Pulmonary effort is normal. No respiratory distress.     Breath sounds: Normal breath sounds.  Abdominal:     General: There is no distension.     Palpations: Abdomen is soft.     Tenderness: There is no abdominal tenderness.  Musculoskeletal:        General: No deformity. Normal range of motion.     Cervical back: Normal range of motion and neck supple.  Skin:    General: Skin is warm and dry.  Neurological:     General: No focal deficit present.     Mental Status: He is alert and oriented to person, place, and time.     (all labs ordered are listed, but only abnormal results are displayed) Labs Reviewed  COMPREHENSIVE METABOLIC PANEL WITH GFR - Abnormal; Notable for the following components:      Result Value   Sodium 131 (*)    Chloride 93 (*)    Glucose, Bld 106 (*)    BUN 25 (*)    Albumin 3.0 (*)    All other components within normal limits  CBC - Abnormal; Notable for the following components:   WBC 11.8 (*)    All other components within normal limits  I-STAT CHEM 8, ED - Abnormal; Notable for the following components:   Sodium 129 (*)    Chloride 96 (*)    BUN 26 (*)    Creatinine, Ser 1.30 (*)    Glucose, Bld 109 (*)    All other components within normal limits  RESP PANEL BY  RT-PCR (RSV, FLU A&B, COVID)  RVPGX2  CULTURE, BLOOD (ROUTINE X 2)  CULTURE, BLOOD (ROUTINE X 2)  LIPASE, BLOOD  URINALYSIS, ROUTINE W REFLEX MICROSCOPIC  PHENYTOIN  LEVEL, TOTAL  LEVETIRACETAM  LEVEL  I-STAT CG4 LACTIC ACID, ED  I-STAT CG4 LACTIC ACID, ED    EKG:  None  Radiology: DG Chest Port 1 View Result Date: 05/23/2024 EXAM: 1 VIEW(S) XRAY OF THE CHEST 05/23/2024 11:33:00 AM COMPARISON: 05/05/2024 CLINICAL HISTORY: sob FINDINGS: LUNGS AND PLEURA: Stable right upper lobe 3.7 cm mass. No pleural effusion. No pneumothorax. HEART AND MEDIASTINUM: No acute abnormality of the cardiac and mediastinal silhouettes. BONES AND SOFT TISSUES: No acute osseous abnormality. IMPRESSION: 1. Stable right upper lobe 3.7 cm mass, unchanged, which may contribute to shortness of breath. 2. No acute findings. Electronically signed by: Waddell Calk MD 05/23/2024 12:04 PM EST RP Workstation: [id number]   CT Head Wo Contrast Result Date: 05/23/2024 EXAM: CT HEAD WITHOUT CONTRAST 05/23/2024 10:46:00 AM TECHNIQUE: CT of the head was performed without the administration of intravenous contrast. Automated exposure control, iterative reconstruction, and/or weight based adjustment of the mA/kV was utilized to reduce the radiation dose to as low as reasonably achievable. COMPARISON: CT head 05/18/2024 and MRI brain 05/10/2024. CLINICAL HISTORY: Mental status change, unknown cause; ams / headache / hx of brain mets. FINDINGS: BRAIN AND VENTRICLES: No acute hemorrhage. No evidence of acute infarct. No hydrocephalus. No extra-axial collection. Overall similar bilateral cerebral metastases with surrounding hypoattenuation likely representing a component of vasogenic edema, and associated regional mass effect without midline shift. Overall similar mild background scattered white matter hypodensities which are nonspecific but most commonly represent chronic microvascular ischemic changes. ORBITS: No acute abnormality. SINUSES:  Chronic left lamina papyracea deformity. SOFT TISSUES AND SKULL: No acute soft tissue abnormality. No skull fracture. IMPRESSION: 1. No acute intracranial hemorrhage or evidence of recently demarcated transcortical infarction. 2. Since 05/18/2024, overall similar bilateral cerebral metastases with associated regional mass effect. No midline shift or brain herniation. Electronically signed by: prentice spade 05/23/2024 11:46 AM EST RP Workstation: [id number]    {Document cardiac monitor, telemetry assessment procedure when appropriate:32947} Procedures   Medications Ordered in the ED  sodium chloride  0.9 % bolus 1,000 mL (0 mLs Intravenous Stopped 05/23/24 1426)  ondansetron  (ZOFRAN ) injection 4 mg (4 mg Intravenous Given 05/23/24 1223)  LORazepam  (ATIVAN ) injection 2 mg (2 mg Intramuscular Given 05/23/24 1137)  levETIRAcetam  (KEPPRA ) undiluted injection 1,500 mg (1,500 mg Intravenous Given 05/23/24 1223)  phenytoin  (DILANTIN ) 150 mg in sodium chloride  0.9 % 25 mL IVPB (0 mg Intravenous Stopped 05/23/24 1426)      {Click here for ABCD2, HEART and other calculators REFRESH Note before signing:1}                              Medical Decision Making Amount and/or Complexity of Data Reviewed Labs: ordered. Radiology: ordered.  Risk Prescription drug management.   ***  {Document critical care time when appropriate  Document review of labs and clinical decision tools ie CHADS2VASC2, etc  Document your independent review of radiology images and any outside records  Document your discussion with family members, caretakers and with consultants  Document social determinants of health affecting pt's care  Document your decision making why or why not admission, treatments were needed:32947:::1}   Final diagnoses:  None    ED Discharge Orders     None

## 2024-05-23 NOTE — ED Notes (Signed)
 Patient requesting to leave, patient told that the doctor would be with him as soon as he could. Patient then requesting to leave AMA.

## 2024-05-24 ENCOUNTER — Encounter (HOSPITAL_COMMUNITY): Payer: Self-pay

## 2024-05-24 ENCOUNTER — Telehealth: Payer: Self-pay | Admitting: Radiation Therapy

## 2024-05-24 ENCOUNTER — Other Ambulatory Visit: Payer: Self-pay | Admitting: Radiation Oncology

## 2024-05-24 DIAGNOSIS — C7931 Secondary malignant neoplasm of brain: Secondary | ICD-10-CM

## 2024-05-24 MED ORDER — ONDANSETRON 8 MG PO TBDP: 8.0000 mg | ORAL_TABLET | Freq: Three times a day (TID) | ORAL | 3 refills | Status: DC | PRN

## 2024-05-24 NOTE — Telephone Encounter (Signed)
-----   Message from Lauraine Golden sent at 05/24/2024 11:17 AM EST ----- Regarding: RE: nausea with dry heaving Thanks, please inform them I just Rx'd ondansetron  to his [facility] on [address] ----- Message ----- From: Yetta Devere DEL Sent: 05/24/2024  10:00 AM EST To: Lauraine Golden, MD; Leeroy CHRISTELLA Due, PA-C; [name] Subject: nausea with dry heaving                      I received a call from this patient's significant other. He has been experiencing severe nausea with dry heaving but not vomiting. He has been taking the steroids as prescribed and was seen in the ED due to seizure. (Nothing new on head CT)  She is calling to request medication to treat his nausea. He is not scheduled to meet with Dr. Sherrod until after his PET scan has been completed.   Please assist.   Devere

## 2024-05-24 NOTE — Telephone Encounter (Signed)
 I spoke with the patient's significant other to inform her that Dr. Izell has just  sent in a Rx for ondansetron  to his [facility] [address].   She was thankful for the call and hopeful this helps with his nausea.   Lucas Yu R.T(R)(T) Radiation Special Procedures Lead

## 2024-05-25 ENCOUNTER — Inpatient Hospital Stay

## 2024-05-25 ENCOUNTER — Inpatient Hospital Stay: Admitting: Internal Medicine

## 2024-05-26 ENCOUNTER — Other Ambulatory Visit: Payer: Self-pay

## 2024-05-26 ENCOUNTER — Telehealth: Payer: Self-pay | Admitting: Radiation Therapy

## 2024-05-26 NOTE — Progress Notes (Signed)
 The proposed treatment discussed in conference is for discussion purpose only and is not a binding recommendation.  The patients have not been physically examined, or presented with their treatment options.  Therefore, final treatment plans cannot be decided.

## 2024-05-26 NOTE — Telephone Encounter (Signed)
 I received a message from the patient's significant other requesting a call back regarding Mr. Fox' lack of appetite. I have forwarded this message to Dr. Charisse nurse, Elverna Lent RN.   Devere Perch R.T(R)(T) Radiation Special Procedures Lead

## 2024-05-27 ENCOUNTER — Other Ambulatory Visit: Payer: Self-pay

## 2024-05-27 ENCOUNTER — Emergency Department
Admission: EM | Admit: 2024-05-27 | Discharge: 2024-05-27 | Disposition: A | Discharge status: Home / Self Care | Attending: Emergency Medicine | Admitting: Emergency Medicine

## 2024-05-27 ENCOUNTER — Telehealth: Payer: Self-pay | Admitting: Radiation Therapy

## 2024-05-27 DIAGNOSIS — R110 Nausea: Secondary | ICD-10-CM

## 2024-05-27 DIAGNOSIS — Z85118 Personal history of other malignant neoplasm of bronchus and lung: Secondary | ICD-10-CM | POA: Insufficient documentation

## 2024-05-27 DIAGNOSIS — C7931 Secondary malignant neoplasm of brain: Secondary | ICD-10-CM

## 2024-05-27 DIAGNOSIS — E871 Hypo-osmolality and hyponatremia: Secondary | ICD-10-CM | POA: Insufficient documentation

## 2024-05-27 DIAGNOSIS — R52 Pain, unspecified: Secondary | ICD-10-CM

## 2024-05-27 DIAGNOSIS — M791 Myalgia, unspecified site: Secondary | ICD-10-CM | POA: Insufficient documentation

## 2024-05-27 DIAGNOSIS — I10 Essential (primary) hypertension: Secondary | ICD-10-CM | POA: Insufficient documentation

## 2024-05-27 HISTORY — DX: Malignant neoplasm of unspecified part of unspecified bronchus or lung: C34.90

## 2024-05-27 LAB — CBC
HCT: 40.1 % (ref 39.0–52.0)
Hemoglobin: 13.6 g/dL (ref 13.0–17.0)
MCH: 30.2 pg (ref 26.0–34.0)
MCHC: 33.9 g/dL (ref 30.0–36.0)
MCV: 89.1 fL (ref 80.0–100.0)
Platelets: 343 K/uL (ref 150–400)
RBC: 4.5 MIL/uL (ref 4.22–5.81)
RDW: 13.1 % (ref 11.5–15.5)
WBC: 9.1 K/uL (ref 4.0–10.5)
nRBC: 0 % (ref 0.0–0.2)

## 2024-05-27 LAB — COMPREHENSIVE METABOLIC PANEL WITH GFR
ALT: 23 U/L (ref 0–44)
AST: 53 U/L — ABNORMAL HIGH (ref 15–41)
Albumin: 3.8 g/dL (ref 3.5–5.0)
Alkaline Phosphatase: 147 U/L — ABNORMAL HIGH (ref 38–126)
Anion gap: 13 (ref 5–15)
BUN: 23 mg/dL — ABNORMAL HIGH (ref 6–20)
CO2: 22 mmol/L (ref 22–32)
Calcium: 9.9 mg/dL (ref 8.9–10.3)
Chloride: 90 mmol/L — ABNORMAL LOW (ref 98–111)
Creatinine, Ser: 1.03 mg/dL (ref 0.61–1.24)
GFR, Estimated: >60 mL/min (ref >60)
Glucose, Bld: 111 mg/dL — ABNORMAL HIGH (ref 70–99)
Potassium: 4.6 mmol/L (ref 3.5–5.1)
Sodium: 125 mmol/L — ABNORMAL LOW (ref 135–145)
Total Bilirubin: 0.4 mg/dL (ref 0.0–1.2)
Total Protein: 8.3 g/dL — ABNORMAL HIGH (ref 6.5–8.1)

## 2024-05-27 MED ORDER — PHENYTOIN 50 MG PO CHEW
150.0000 mg | CHEWABLE_TABLET | Freq: Once | ORAL | Status: AC
  Administered 2024-05-27: 150 mg via ORAL
  Filled 2024-05-27: qty 3

## 2024-05-27 MED ORDER — LEVETIRACETAM 500 MG PO TABS
1500.0000 mg | ORAL_TABLET | Freq: Once | ORAL | Status: AC
  Administered 2024-05-27: 1500 mg via ORAL
  Filled 2024-05-27: qty 3

## 2024-05-27 MED ORDER — HYDROMORPHONE HCL 1 MG/ML IJ SOLN
1.0000 mg | Freq: Once | INTRAMUSCULAR | Status: AC
  Administered 2024-05-27: 1 mg via INTRAVENOUS
  Filled 2024-05-27: qty 1

## 2024-05-27 MED ORDER — ONDANSETRON HCL 4 MG/2ML IJ SOLN
4.0000 mg | Freq: Once | INTRAMUSCULAR | Status: AC
  Administered 2024-05-27: 4 mg via INTRAVENOUS
  Filled 2024-05-27: qty 2

## 2024-05-27 MED ORDER — SODIUM CHLORIDE 0.9 % IV BOLUS
500.0000 mL | Freq: Once | INTRAVENOUS | Status: AC
  Administered 2024-05-27: 500 mL via INTRAVENOUS

## 2024-05-27 MED ORDER — SODIUM CHLORIDE 0.9 % IV BOLUS
1000.0000 mL | Freq: Once | INTRAVENOUS | Status: AC
  Administered 2024-05-27: 1000 mL via INTRAVENOUS

## 2024-05-27 MED ORDER — HYDROMORPHONE HCL 1 MG/ML IJ SOLN
0.5000 mg | Freq: Once | INTRAMUSCULAR | Status: AC
  Administered 2024-05-27: 0.5 mg via INTRAVENOUS
  Filled 2024-05-27: qty 0.5

## 2024-05-27 NOTE — ED Provider Notes (Signed)
 [facility] Provider Note    Event Date/Time   First MD Initiated Contact with Patient 05/27/24 [phone]     (approximate)  History   Chief Complaint: Generalized Body Aches  HPI  Lucas Yu is a 58 y.o. male with a past medical history of hypertension, metastatic lung cancer with brain metastases, seizures, presents to the emergency department for generalized bodyaches and nausea.  According to the patient over the last few days he has been experiencing generalized pain throughout his body along with nausea has not been eating or drinking very much.  Patient denies any focal complaints.  He described as body aches all throughout his body.  No fever.  States nausea but no vomiting.  No diarrhea.  No chest pain or shortness of breath.  Physical Exam   Triage Vital Signs: ED Triage Vitals  Encounter Vitals Group     BP 05/27/24 0718 (!) 147/108     Girls Systolic BP Percentile --      Girls Diastolic BP Percentile --      Boys Systolic BP Percentile --      Boys Diastolic BP Percentile --      Pulse Rate 05/27/24 0718 87     Resp 05/27/24 0718 18     Temp 05/27/24 0718 97.7 F (36.5 C)     Temp Source 05/27/24 0718 Oral     SpO2 05/27/24 0718 98 %     Weight --      Height --      Head Circumference --      Peak Flow --      Pain Score 05/27/24 0717 10     Pain Loc --      Pain Education --      Exclude from Growth Chart --     Most recent vital signs: Vitals:   05/27/24 0718  BP: (!) 147/108  Pulse: 87  Resp: 18  Temp: 97.7 F (36.5 C)  SpO2: 98%    General: Awake, no distress.  CV:  Good peripheral perfusion.  Regular rate and rhythm  Resp:  Normal effort.  Equal breath sounds bilaterally.  Abd:  No distention.  Soft, nontender.  No rebound or guarding.  ED Results / Procedures / Treatments   MEDICATIONS ORDERED IN ED: Medications  sodium chloride  0.9 % bolus 1,000 mL (has no administration in time range)  HYDROmorphone  (DILAUDID )  injection 1 mg (has no administration in time range)  ondansetron  (ZOFRAN ) injection 4 mg (has no administration in time range)     IMPRESSION / MDM / ASSESSMENT AND PLAN / ED COURSE  I reviewed the triage vital signs and the nursing notes.  Patient's presentation is most consistent with acute presentation with potential threat to life or bodily function.  Patient presents to the emergency department for generalized bodyaches/pain throughout his body along with nausea and decreased appetite.  Patient has metastatic lung cancer with known brain metastases and seizures.  No reported seizure activity.  Patient is awake alert answering all questions appropriately.  Patient's main complaint is pain throughout his body along with nausea.  We will IV hydrate we will check labs we will dose nausea and pain medication.  Will continue to closely monitor while awaiting results.  Patient agreeable to plan.  Patient states he is feeling better after medication.  Patient's lab work shows reassuring CBC his chemistry shows hyponatremia at 125 however his baseline appears to be in upper 120s to low 130s.  Patient has received 1.5 L of normal saline.  Will place the patient on salt tablets for 5 days have him follow-up with his doctor this week for recheck.  Patient agreeable to plan.  FINAL CLINICAL IMPRESSION(S) / ED DIAGNOSES   Body aches Nausea  Note:  This document was prepared using Dragon voice recognition software and may include unintentional dictation errors.   Dorothyann Drivers, MD 05/27/24 1133

## 2024-05-27 NOTE — ED Notes (Signed)
Patient quiet and sleeping.

## 2024-05-27 NOTE — ED Triage Notes (Signed)
 Patient states generalized body aches x 2 days; history of lung and brain cancer with current radiation treatment. States he had the same symptoms on 05/23/2024 and was seen at [facility].

## 2024-05-27 NOTE — ED Notes (Signed)
 Called lab regarding labs not being in process. Was told they're sitting in my rack, I'll work on it.

## 2024-05-27 NOTE — ED Notes (Signed)
 Called fiance per her request and she is asking for us  to give Levetiracetam  and phenytoin  for seizures. Will notify MD.

## 2024-05-27 NOTE — Telephone Encounter (Addendum)
  I received a call from this patient's significant other today stating that he has been experiencing severe widespread pain all over. She had to take him to the hospital to be given an injection to assist, but was not given any additional medication to manage the pain properly at home. I let her know that this would be outside of the scope of care for Dr. Izell since she is managing his brain mets and he has completed his treatment course. Also, not knowing the cause of pain makes it very difficult to treat the pain properly. He is scheduled to have a PET scan next Friday 12/12 and then to see Med Onc shortly after that. Is a referral to palliative care for pain management appropriate for this patient?  This message was sent to Dr. Izell, her nurse and [facility] team.   Lucas Yu R.T(R)(T) Radiation Special Procedures Lead

## 2024-05-28 LAB — CULTURE, BLOOD (ROUTINE X 2): Culture: NO GROWTH

## 2024-05-29 ENCOUNTER — Emergency Department

## 2024-05-29 ENCOUNTER — Observation Stay
Admission: EM | Admit: 2024-05-29 | Discharge: 2024-06-23 | DRG: 100 | Disposition: E | Attending: Emergency Medicine | Admitting: Emergency Medicine

## 2024-05-29 ENCOUNTER — Other Ambulatory Visit: Payer: Self-pay

## 2024-05-29 ENCOUNTER — Encounter: Payer: Self-pay | Admitting: Hospitalist

## 2024-05-29 DIAGNOSIS — R55 Syncope and collapse: Secondary | ICD-10-CM | POA: Diagnosis not present

## 2024-05-29 DIAGNOSIS — R52 Pain, unspecified: Secondary | ICD-10-CM

## 2024-05-29 DIAGNOSIS — C3490 Malignant neoplasm of unspecified part of unspecified bronchus or lung: Secondary | ICD-10-CM | POA: Diagnosis present

## 2024-05-29 DIAGNOSIS — R531 Weakness: Principal | ICD-10-CM

## 2024-05-29 DIAGNOSIS — R200 Anesthesia of skin: Secondary | ICD-10-CM

## 2024-05-29 DIAGNOSIS — I10 Essential (primary) hypertension: Secondary | ICD-10-CM | POA: Diagnosis present

## 2024-05-29 LAB — CBC
HCT: 39.4 % (ref 39.0–52.0)
HCT: 36.9 % — ABNORMAL LOW (ref 39.0–52.0)
Hemoglobin: 13.5 g/dL (ref 13.0–17.0)
Hemoglobin: 12.4 g/dL — ABNORMAL LOW (ref 13.0–17.0)
MCH: 30.8 pg (ref 26.0–34.0)
MCH: 30.5 pg (ref 26.0–34.0)
MCHC: 34.3 g/dL (ref 30.0–36.0)
MCHC: 33.6 g/dL (ref 30.0–36.0)
MCV: 89.7 fL (ref 80.0–100.0)
MCV: 90.9 fL (ref 80.0–100.0)
Platelets: 352 K/uL (ref 150–400)
Platelets: 332 K/uL (ref 150–400)
RBC: 4.39 MIL/uL (ref 4.22–5.81)
RBC: 4.06 MIL/uL — ABNORMAL LOW (ref 4.22–5.81)
RDW: 12.9 % (ref 11.5–15.5)
RDW: 13.2 % (ref 11.5–15.5)
WBC: 9.6 K/uL (ref 4.0–10.5)
WBC: 8.7 K/uL (ref 4.0–10.5)
nRBC: 0 % (ref 0.0–0.2)
nRBC: 0 % (ref 0.0–0.2)

## 2024-05-29 LAB — URINALYSIS, ROUTINE W REFLEX MICROSCOPIC
Bilirubin Urine: NEGATIVE
Glucose, UA: NEGATIVE mg/dL
Hgb urine dipstick: NEGATIVE
Ketones, ur: 5 mg/dL — AB
Leukocytes,Ua: NEGATIVE
Nitrite: NEGATIVE
Protein, ur: NEGATIVE mg/dL
Specific Gravity, Urine: 1.032 — ABNORMAL HIGH (ref 1.005–1.030)
pH: 5 (ref 5.0–8.0)

## 2024-05-29 LAB — COMPREHENSIVE METABOLIC PANEL WITH GFR
ALT: 29 U/L (ref 0–44)
AST: 69 U/L — ABNORMAL HIGH (ref 15–41)
Albumin: 3.7 g/dL (ref 3.5–5.0)
Alkaline Phosphatase: 164 U/L — ABNORMAL HIGH (ref 38–126)
Anion gap: 13 (ref 5–15)
BUN: 19 mg/dL (ref 6–20)
CO2: 24 mmol/L (ref 22–32)
Calcium: 9.9 mg/dL (ref 8.9–10.3)
Chloride: 91 mmol/L — ABNORMAL LOW (ref 98–111)
Creatinine, Ser: 1.02 mg/dL (ref 0.61–1.24)
GFR, Estimated: >60 mL/min (ref >60)
Glucose, Bld: 102 mg/dL — ABNORMAL HIGH (ref 70–99)
Potassium: 4.5 mmol/L (ref 3.5–5.1)
Sodium: 128 mmol/L — ABNORMAL LOW (ref 135–145)
Total Bilirubin: 0.4 mg/dL (ref 0.0–1.2)
Total Protein: 8.2 g/dL — ABNORMAL HIGH (ref 6.5–8.1)

## 2024-05-29 LAB — MAGNESIUM: Magnesium: 2.5 mg/dL — ABNORMAL HIGH (ref 1.7–2.4)

## 2024-05-29 LAB — CBG MONITORING, ED: Glucose-Capillary: 96 mg/dL (ref 70–99)

## 2024-05-29 LAB — TROPONIN T, HIGH SENSITIVITY
Troponin T High Sensitivity: <15 ng/L (ref 0–19)
Troponin T High Sensitivity: <15 ng/L (ref 0–19)

## 2024-05-29 LAB — CREATININE, SERUM
Creatinine, Ser: 0.87 mg/dL (ref 0.61–1.24)
GFR, Estimated: >60 mL/min (ref >60)

## 2024-05-29 LAB — PHENYTOIN LEVEL, TOTAL: Phenytoin Lvl: 13.7 ug/mL (ref 10.0–20.0)

## 2024-05-29 LAB — CK: Total CK: 87 U/L (ref 49–397)

## 2024-05-29 MED ORDER — GABAPENTIN 400 MG PO CAPS
400.0000 mg | ORAL_CAPSULE | Freq: Every day | ORAL | Status: DC | PRN
  Filled 2024-05-29: qty 1

## 2024-05-29 MED ORDER — LEVETIRACETAM 500 MG PO TABS
1500.0000 mg | ORAL_TABLET | Freq: Once | ORAL | Status: AC
  Administered 2024-05-29: 1500 mg via ORAL
  Filled 2024-05-29: qty 3

## 2024-05-29 MED ORDER — DEXTROSE-SODIUM CHLORIDE 5-0.9 % IV SOLN: INTRAVENOUS | Status: DC

## 2024-05-29 MED ORDER — VITAMIN B-12 1000 MCG PO TABS
1000.0000 ug | ORAL_TABLET | Freq: Every day | ORAL | Status: DC
  Administered 2024-05-30: 1000 ug via ORAL
  Filled 2024-05-29 (×3): qty 1

## 2024-05-29 MED ORDER — SODIUM CHLORIDE 0.9 % IV BOLUS (SEPSIS)
1000.0000 mL | Freq: Once | INTRAVENOUS | Status: AC
  Administered 2024-05-29: 1000 mL via INTRAVENOUS

## 2024-05-29 MED ORDER — ACETAMINOPHEN 650 MG RE SUPP: 650.0000 mg | Freq: Four times a day (QID) | RECTAL | Status: DC | PRN

## 2024-05-29 MED ORDER — OXYCODONE HCL 5 MG PO TABS
10.0000 mg | ORAL_TABLET | Freq: Once | ORAL | Status: AC
  Administered 2024-05-29: 10 mg via ORAL
  Filled 2024-05-29: qty 2

## 2024-05-29 MED ORDER — OXYCODONE HCL 5 MG PO TABS
5.0000 mg | ORAL_TABLET | ORAL | Status: DC | PRN
  Administered 2024-05-30: 5 mg via ORAL
  Filled 2024-05-29: qty 1

## 2024-05-29 MED ORDER — PHENYTOIN 50 MG PO CHEW
150.0000 mg | CHEWABLE_TABLET | Freq: Two times a day (BID) | ORAL | Status: DC
  Administered 2024-05-29 – 2024-05-30 (×3): 150 mg via ORAL
  Filled 2024-05-29 (×5): qty 3

## 2024-05-29 MED ORDER — SUCRALFATE 1 G PO TABS
1.0000 g | ORAL_TABLET | Freq: Four times a day (QID) | ORAL | Status: DC
  Administered 2024-05-29 – 2024-05-30 (×5): 1 g via ORAL
  Filled 2024-05-29 (×7): qty 1

## 2024-05-29 MED ORDER — LEVETIRACETAM 500 MG PO TABS
1500.0000 mg | ORAL_TABLET | Freq: Two times a day (BID) | ORAL | Status: DC
  Administered 2024-05-29 – 2024-05-30 (×2): 1500 mg via ORAL
  Filled 2024-05-29 (×3): qty 3

## 2024-05-29 MED ORDER — ONDANSETRON HCL 4 MG PO TABS: 4.0000 mg | ORAL_TABLET | Freq: Four times a day (QID) | ORAL | Status: DC | PRN

## 2024-05-29 MED ORDER — HYDROMORPHONE HCL 1 MG/ML IJ SOLN
1.0000 mg | INTRAMUSCULAR | Status: DC | PRN
  Administered 2024-05-29 – 2024-05-31 (×9): 1 mg via INTRAVENOUS
  Filled 2024-05-29 (×10): qty 1

## 2024-05-29 MED ORDER — MORPHINE SULFATE (PF) 4 MG/ML IV SOLN
4.0000 mg | Freq: Once | INTRAVENOUS | Status: AC
  Administered 2024-05-29: 4 mg via INTRAVENOUS
  Filled 2024-05-29: qty 1

## 2024-05-29 MED ORDER — DEXAMETHASONE 4 MG PO TABS
4.0000 mg | ORAL_TABLET | Freq: Two times a day (BID) | ORAL | Status: AC
  Administered 2024-05-29 (×2): 4 mg via ORAL
  Filled 2024-05-29 (×3): qty 1

## 2024-05-29 MED ORDER — GADOBUTROL 1 MMOL/ML IV SOLN
7.0000 mL | Freq: Once | INTRAVENOUS | Status: AC | PRN
  Administered 2024-05-29: 7 mL via INTRAVENOUS

## 2024-05-29 MED ORDER — ONDANSETRON HCL 4 MG/2ML IJ SOLN
4.0000 mg | Freq: Once | INTRAMUSCULAR | Status: AC
  Administered 2024-05-29: 4 mg via INTRAVENOUS
  Filled 2024-05-29: qty 2

## 2024-05-29 MED ORDER — ENOXAPARIN SODIUM 40 MG/0.4ML IJ SOSY
40.0000 mg | PREFILLED_SYRINGE | INTRAMUSCULAR | Status: DC
  Administered 2024-05-29 – 2024-05-31 (×2): 40 mg via SUBCUTANEOUS
  Filled 2024-05-29 (×2): qty 0.4

## 2024-05-29 MED ORDER — POLYETHYLENE GLYCOL 3350 17 G PO PACK: 17.0000 g | PACK | Freq: Every day | ORAL | Status: DC | PRN

## 2024-05-29 MED ORDER — NICOTINE 7 MG/24HR TD PT24
7.0000 mg | MEDICATED_PATCH | Freq: Every day | TRANSDERMAL | Status: DC
  Administered 2024-05-29 – 2024-06-01 (×4): 7 mg via TRANSDERMAL
  Filled 2024-05-29 (×4): qty 1

## 2024-05-29 MED ORDER — LORATADINE 10 MG PO TABS
10.0000 mg | ORAL_TABLET | Freq: Every day | ORAL | Status: DC
  Administered 2024-05-29 – 2024-05-30 (×2): 10 mg via ORAL
  Filled 2024-05-29 (×4): qty 1

## 2024-05-29 MED ORDER — PANTOPRAZOLE SODIUM 40 MG PO TBEC
40.0000 mg | DELAYED_RELEASE_TABLET | Freq: Every day | ORAL | Status: DC
  Administered 2024-05-29 – 2024-05-31 (×3): 40 mg via ORAL
  Filled 2024-05-29 (×3): qty 1

## 2024-05-29 MED ORDER — DEXAMETHASONE 4 MG PO TABS: 4.0000 mg | ORAL_TABLET | Freq: Every day | ORAL | Status: DC

## 2024-05-29 MED ORDER — ACETAMINOPHEN 325 MG PO TABS: 650.0000 mg | ORAL_TABLET | Freq: Four times a day (QID) | ORAL | Status: DC | PRN

## 2024-05-29 MED ORDER — DEXAMETHASONE 4 MG PO TABS: 4.0000 mg | ORAL_TABLET | ORAL | Status: DC

## 2024-05-29 MED ORDER — ONDANSETRON HCL 4 MG/2ML IJ SOLN
4.0000 mg | Freq: Four times a day (QID) | INTRAMUSCULAR | Status: DC | PRN
  Administered 2024-05-29 – 2024-05-30 (×2): 4 mg via INTRAVENOUS
  Filled 2024-05-29 (×2): qty 2

## 2024-05-29 MED ORDER — LORAZEPAM 0.5 MG PO TABS: 0.5000 mg | ORAL_TABLET | ORAL | Status: DC | PRN

## 2024-05-29 NOTE — ED Provider Notes (Signed)
 8:18 AM Assumed care for off going team.   Blood pressure (!) 140/109, pulse 98, temperature 98 F (36.7 C), temperature source Oral, resp. rate 20, height 5' 7 (1.702 m), weight 75.8 kg, SpO2 100%.  See their HPI for full report but in brief     1. Stable round peripherally enhancing masses in the right posterior frontal  lobe (18 mm) and left parietooccipital junction (11-12 mm) with moderate  surrounding vasogenic edema.  2. Small subcentimeter lesion in the left cerebellar hemisphere.  3. No new lesions evident. Evaluation limited by patient motion.   8:40 AM discussed with radiology and there is no worsening edema from his prior MRI.  UA is negative for UTI  Will discuss possible team for syncopal episode as well as with continued weakness and difficulties getting around at home discussed this with family and they felt comfortable with plan.  Patient requesting his pain medication and 10 mg of oxycodone  was ordered.   Ernest Ronal BRAVO, MD 05/29/24 [phone]

## 2024-05-29 NOTE — ED Triage Notes (Signed)
 Patient brought in by EMS from home for generalized body aches/pain x4-5 days. Current treatment for lung cancer, brain cancer. Patient denies taking meds at home. EMS reports family told them of possible syncopal episode ~3m ago, stating that he was unresponsive in his bed. BGL 118 with EMS, no meds given. AxOx3 in triage, disoriented to time.
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
## 2024-05-29 NOTE — ED Notes (Signed)
 Advised nurse that patient has ready bed

## 2024-05-29 NOTE — ED Provider Notes (Signed)
 [facility] Provider Note    Event Date/Time   First MD Initiated Contact with Patient 05/29/24 [phone]     (approximate)   History   Generalized Body Aches and Loss of Consciousness   HPI  Lucas Yu is a 58 y.o. male with history of metastatic lung cancer with mets to the brain currently getting radiation, hypertension who presents to the emergency department complaints of right arm and leg numbness, weakness and pain ongoing for several days.  Was seen at [facility] on 05/23/2024 for the same and had a negative head CT.  He left AGAINST MEDICAL ADVICE.  Return to our emergency department 2 days ago and had a reassuring workup and was discharged home.  He states his pain has been uncontrolled and he is not sure if he has pain medicine at home but it does look like he was recently given 49 tablets of 10 mg of oxycodone  that was filled 6 days ago.  He denies any head injury, head trauma, neck or back pain.  No chest or abdominal pain.  No shortness of breath.  No fevers, cough, vomiting or diarrhea.  No prior history of stroke.  He also reports having a syncopal event while in bed.  No witnessed seizure activity, tongue biting or incontinence.  He is on Dilantin  and Keppra  for which he reports compliance.  States he just felt very dizzy.  No chest pain or shortness of breath.  History provided by patient.    Past Medical History:  Diagnosis Date   Brain tumor (benign) (HCC)    Hypertension    Lung cancer (HCC)     Past Surgical History:  Procedure Laterality Date   VIDEO BRONCHOSCOPY WITH ENDOBRONCHIAL NAVIGATION N/A 05/05/2024   Procedure: VIDEO BRONCHOSCOPY WITH ENDOBRONCHIAL NAVIGATION;  Surgeon: Kara Dorn NOVAK, MD;  Location: [facility] ENDOSCOPY;  Service: Pulmonary;  Laterality: N/A;   VIDEO BRONCHOSCOPY WITH ENDOBRONCHIAL ULTRASOUND Right 05/05/2024   Procedure: BRONCHOSCOPY, WITH EBUS;  Surgeon: Kara Dorn NOVAK, MD;  Location: [facility] ENDOSCOPY;  Service:  Pulmonary;  Laterality: Right;    MEDICATIONS:  Prior to Admission medications   Medication Sig Start Date End Date Taking? Authorizing Provider  cetirizine (ZYRTEC) 10 MG tablet Take 10 mg by mouth daily as needed for allergies. 03/08/24   [provider]  cyanocobalamin  1000 MCG tablet Take 1 tablet (1,000 mcg total) by mouth daily. 05/27/24 08/25/24  Von Bellis, MD  cyclobenzaprine  (FLEXERIL ) 10 MG tablet Take 10 mg by mouth 3 (three) times daily as needed for muscle spasms.    [provider]  dexamethasone  (DECADRON ) 4 MG tablet Take 1 tablet (4 mg total) by mouth 3 (three) times daily. 05/05/24 06/04/24  Hongalgi, Anand D, MD  fluconazole  (DIFLUCAN ) 100 MG tablet Take 2 tablets today, then 1 tablet daily x 20 more days. 05/17/24   Izell Domino, MD  gabapentin  (NEURONTIN ) 400 MG capsule Take 1 capsule (400 mg total) by mouth daily as needed (for pain). 05/05/24   Hongalgi, Anand D, MD  levETIRAcetam  (KEPPRA ) 750 MG tablet Take 2 tablets (1,500 mg total) by mouth 2 (two) times daily. 05/20/24 05/20/25  Von Bellis, MD  LORazepam  (ATIVAN ) 0.5 MG tablet Take 1 tablet (0.5 mg total) by mouth 20 minutes prior to PET scan. Do not drive after taking medication. 05/18/24   Heilingoetter, Cassandra L, PA-C  methocarbamol (ROBAXIN) 500 MG tablet One twice daily prn for muscle spasm 04/19/24   [provider]  naproxen  (NAPRELAN )  500 MG 24 hr tablet Take 500 mg by mouth as needed (As needed for pain up to twice a day).    [provider]  nicotine  (NICODERM CQ  - DOSED IN MG/24 HOURS) 14 mg/24hr patch Place 1 patch (14 mg total) onto the skin daily. Apply 21 mg patch daily x 6 wk, then 14mg  patch daily x 2 wk, then 7 mg patch daily x 2 wk 05/11/24   Wyatt Leeroy HERO, PA-C  nicotine  (NICODERM CQ  - DOSED IN MG/24 HOURS) 21 mg/24hr patch Place 1 patch (21 mg total) onto the skin daily. Apply 21 mg patch daily x 6 wk, then 14mg  patch daily x 2 wk, then 7 mg patch daily x 2 wk  05/11/24   Wyatt Leeroy HERO, PA-C  nicotine  (NICODERM CQ  - DOSED IN MG/24 HR) 7 mg/24hr patch Place 1 patch (7 mg total) onto the skin daily. 05/06/24   Hongalgi, Anand D, MD  nicotine  (NICODERM CQ  - DOSED IN MG/24 HR) 7 mg/24hr patch Place 1 patch (7 mg total) onto the skin daily. Apply 21 mg patch daily x 6 wk, then 14mg  patch daily x 2 wk, then 7 mg patch daily x 2 wk 05/11/24   Wyatt Leeroy HERO, PA-C  ondansetron  (ZOFRAN -ODT) 8 MG disintegrating tablet Take 1 tablet (8 mg total) by mouth every 8 (eight) hours as needed for nausea or vomiting. 05/24/24   Izell Domino, MD  pantoprazole  (PROTONIX ) 40 MG tablet Take 1 tablet (40 mg total) by mouth daily. 04/21/24 04/21/25  Dorothyann Drivers, MD  phenytoin  (DILANTIN ) 50 MG tablet Chew 3 tablets (150 mg total) by mouth 2 (two) times daily. 05/20/24 08/18/24  Nichola Zeke HERO, MD  polyethylene glycol powder (GLYCOLAX /MIRALAX ) 17 GM/SCOOP powder Take 17 g by mouth 2 (two) times daily. Dissolve 1 capful (17g) in 4-8 ounces of liquid and take by mouth daily. 05/05/24   Hongalgi, Anand D, MD  pregabalin  (LYRICA ) 25 MG capsule Take 25 mg by mouth every 8 (eight) hours. 03/01/24   [provider]  sucralfate  (CARAFATE ) 1 g tablet Take 1 tablet (1 g total) by mouth 4 (four) times daily for 15 days. 04/21/24 05/19/24  Paduchowski, Kevin, MD  tadalafil (CIALIS) 20 MG tablet Take 20 mg by mouth once as needed for erectile dysfunction. 03/28/24   [provider]    Physical Exam   Triage Vital Signs: ED Triage Vitals  Encounter Vitals Group     BP 05/29/24 0446 (!) 140/109     Girls Systolic BP Percentile --      Girls Diastolic BP Percentile --      Boys Systolic BP Percentile --      Boys Diastolic BP Percentile --      Pulse Rate 05/29/24 0448 98     Resp 05/29/24 0448 20     Temp 05/29/24 0444 98 F (36.7 C)     Temp Source 05/29/24 0444 Oral     SpO2 05/29/24 0448 100 %     Weight 05/29/24 0445 167 lb (75.8 kg)     Height 05/29/24 0445  5' 7 (1.702 m)     Head Circumference --      Peak Flow --      Pain Score 05/29/24 0445 10     Pain Loc --      Pain Education --      Exclude from Growth Chart --     Most recent vital signs: Vitals:   05/29/24 0446 05/29/24 0448  BP: (!) 140/109   Pulse:  98  Resp:  20  Temp:    SpO2:  100%    CONSTITUTIONAL: Alert, responds appropriately to questions.  Chronically ill-appearing HEAD: Normocephalic, atraumatic EYES: Conjunctivae clear, pupils appear equal, sclera nonicteric ENT: normal nose; moist mucous membranes NECK: Supple, normal ROM CARD: RRR; S1 and S2 appreciated RESP: Normal chest excursion without splinting or tachypnea; breath sounds clear and equal bilaterally; no wheezes, no rhonchi, no rales, no hypoxia or respiratory distress, speaking full sentences ABD/GI: Non-distended; soft, non-tender, no rebound, no guarding, no peritoneal signs BACK: The back appears normal EXT: Normal ROM in all joints; no deformity noted, no edema SKIN: Normal color for age and race; warm; no rash on exposed skin NEURO: Diminished sensation in the right arm and leg compared to the left, decreased strength in the right leg where he cannot even lift it against gravity, diminished grip strength in the right hand compared to the left but no drift, cranial nerves II through XII intact, normal speech PSYCH: The patient's mood and manner are appropriate.   ED Results / Procedures / Treatments   LABS: (all labs ordered are listed, but only abnormal results are displayed) Labs Reviewed  COMPREHENSIVE METABOLIC PANEL WITH GFR - Abnormal; Notable for the following components:      Result Value   Sodium 128 (*)    Chloride 91 (*)    Glucose, Bld 102 (*)    Total Protein 8.2 (*)    AST 69 (*)    Alkaline Phosphatase 164 (*)    All other components within normal limits  MAGNESIUM - Abnormal; Notable for the following components:   Magnesium 2.5 (*)    All other components within normal  limits  CBC  CK  PHENYTOIN  LEVEL, TOTAL  URINALYSIS, ROUTINE W REFLEX MICROSCOPIC  LEVETIRACETAM  LEVEL  CBG MONITORING, ED  CBG MONITORING, ED  TROPONIN T, HIGH SENSITIVITY  TROPONIN T, HIGH SENSITIVITY     EKG:  EKG Interpretation Date/Time:  Sunday May 29 2024 04:49:09 EST Ventricular Rate:  99 PR Interval:  128 QRS Duration:  78 QT Interval:  350 QTC Calculation: 449 R Axis:   79  Text Interpretation: Normal sinus rhythm Nonspecific ST abnormality Abnormal ECG When compared with ECG of 18-May-2024 19:33, PREVIOUS ECG IS PRESENT Confirmed by Neomi Neptune [phone]) on 05/29/2024 6:16:21 AM         RADIOLOGY: My personal review and interpretation of imaging: MRI brain shows no acute change.  I have personally reviewed all radiology reports.   MR Brain W and Wo Contrast Result Date: 05/29/2024 EXAM: MRI BRAIN WITH AND WITHOUT CONTRAST 05/29/2024 07:22:47 AM TECHNIQUE: Multiplanar multisequence MRI of the head/brain was performed with and without the administration of 7 mL of gadobutrol  (GADAVIST ) 1 MMOL/ML injection. COMPARISON: MRI of the head dated 05/10/2024. 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 FINDINGS: LIMITATIONS/ARTIFACTS: This study is limited by patient motion. BRAIN AND VENTRICLES: No acute infarct. No acute intracranial hemorrhage. No mass effect or midline shift. No hydrocephalus. The sella is unremarkable. Normal flow voids. A round peripherally enhancing mass is again demonstrated in the right posterior frontal lobe, again measuring approximately 18 mm in diameter. There is moderate surrounding vasogenic edema. A round enhancing mass is also again demonstrated at the left parietooccipital junction, again measuring 11 to 12 mm in diameter, similarly unchanged. A small subcentimeter lesion is again demonstrated within  the left cerebellar hemisphere, best  visualized on coronal image 12 of series 19. The punctate lesion previously seen medially within the left frontal lobe is not convincingly demonstrated on the current exam, but the axial images are significantly limited. There are no new lesions evident. ORBITS: No acute abnormality. SINUSES: No acute abnormality. BONES AND SOFT TISSUES: Normal bone marrow signal and enhancement. No acute soft tissue abnormality. IMPRESSION: 1. Stable round peripherally enhancing masses in the right posterior frontal lobe (18 mm) and left parietooccipital junction (11-12 mm) with moderate surrounding vasogenic edema. 2. Small subcentimeter lesion in the left cerebellar hemisphere. 3. No new lesions evident. Evaluation limited by patient motion. Electronically signed by: Evalene Coho MD 05/29/2024 08:07 AM EST RP Workstation: [id number]     PROCEDURES:  Critical Care performed: No      .1-3 Lead EKG Interpretation  Performed by: Aliannah Holstrom, Josette SAILOR, DO Authorized by: Rayli Wiederhold, Josette SAILOR, DO     Interpretation: normal     ECG rate:  98   ECG rate assessment: normal     Rhythm: sinus rhythm     Ectopy: none     Conduction: normal       IMPRESSION / MDM / ASSESSMENT AND PLAN / ED COURSE  I reviewed the triage vital signs and the nursing notes.    Patient here with increasing numbness and weakness in the right arm and leg.  History of metastatic cancer.  Also had a syncopal event and dizziness at home.  The patient is on the cardiac monitor to evaluate for evidence of arrhythmia and/or significant heart rate changes.   DIFFERENTIAL DIAGNOSIS (includes but not limited to):   Worsening brain metastasis, stroke outside of tPA window, intracranial hemorrhage, changes secondary to radiation, anemia, electrolyte derangement, UTI, ACS, arrhythmia, less likely PE or dissection given no chest pain or shortness of breath   Patient's presentation is most consistent with acute presentation with potential threat to  life or bodily function.   PLAN: Will obtain labs, urine, MRI of the brain with and without contrast.  Will give pain medication here, IV fluids.   MEDICATIONS GIVEN IN ED: Medications  sodium chloride  0.9 % bolus 1,000 mL (0 mLs Intravenous Stopped 05/29/24 0642)  morphine  (PF) 4 MG/ML injection 4 mg (4 mg Intravenous Given 05/29/24 0612)  ondansetron  (ZOFRAN ) injection 4 mg (4 mg Intravenous Given 05/29/24 0612)  gadobutrol  (GADAVIST ) 1 MMOL/ML injection 7 mL (7 mLs Intravenous Contrast Given 05/29/24 0722)  levETIRAcetam  (KEPPRA ) tablet 1,500 mg (1,500 mg Oral Given 05/29/24 0815)     ED COURSE: Patient's labs show sodium of 128 which appears chronic and improved compared to 2 days ago.  Normal blood glucose.  Normal hemoglobin.  Troponin negative.  CK normal.  Dilantin  level normal.  Keppra  level is pending.  Normal blood glucose here.  MRI of the brain reviewed and interpreted by myself and radiologist and shows no acute change, no stroke or worsening metastasis.  He does have a left parieto-occipital metastasis with surrounding vasogenic edema which could be contributing to his right-sided weakness and numbness.  Second troponin, urinalysis pending.  Patient will need admission for syncopal workup and PT given right-sided weakness.  He is unable to even lift his right leg off the bed against gravity.  I do not think he will be safe for home.  Oncoming ED physician to reach out to the hospitalist for admission.   CONSULTS: Will discuss with hospitalist for admission.   OUTSIDE RECORDS REVIEWED: Reviewed recent  oncology notes.       FINAL CLINICAL IMPRESSION(S) / ED DIAGNOSES   Final diagnoses:  Right sided weakness  Right sided numbness  Uncontrolled pain  Syncope, unspecified syncope type     Rx / DC Orders   ED Discharge Orders     None        Note:  This document was prepared using Dragon voice recognition software and may include unintentional dictation  errors.   Lydia Toren, Josette SAILOR, DO 05/29/24 [phone]

## 2024-05-29 NOTE — Plan of Care (Signed)
  Problem: Safety: Goal: Ability to remain free from injury will improve Outcome: Progressing   Problem: Skin Integrity: Goal: Risk for impaired skin integrity will decrease Outcome: Progressing   Problem: Education: Goal: Knowledge of General Education information will improve Description: Including pain rating scale, medication(s)/side effects and non-pharmacologic comfort measures Outcome: Not Progressing   Problem: Health Behavior/Discharge Planning: Goal: Ability to manage health-related needs will improve Outcome: Not Progressing   Problem: Clinical Measurements: Goal: Ability to maintain clinical measurements within normal limits will improve Outcome: Not Progressing   Problem: Activity: Goal: Risk for activity intolerance will decrease Outcome: Not Progressing   Problem: Nutrition: Goal: Adequate nutrition will be maintained Outcome: Not Progressing   Problem: Coping: Goal: Level of anxiety will decrease Outcome: Not Progressing   Problem: Pain Managment: Goal: General experience of comfort will improve and/or be controlled Outcome: Not Progressing

## 2024-05-30 ENCOUNTER — Other Ambulatory Visit: Payer: Self-pay

## 2024-05-30 DIAGNOSIS — R627 Adult failure to thrive: Secondary | ICD-10-CM | POA: Diagnosis present

## 2024-05-30 DIAGNOSIS — Z923 Personal history of irradiation: Secondary | ICD-10-CM | POA: Diagnosis not present

## 2024-05-30 DIAGNOSIS — Z515 Encounter for palliative care: Secondary | ICD-10-CM | POA: Diagnosis not present

## 2024-05-30 DIAGNOSIS — C7931 Secondary malignant neoplasm of brain: Secondary | ICD-10-CM | POA: Diagnosis present

## 2024-05-30 DIAGNOSIS — C7972 Secondary malignant neoplasm of left adrenal gland: Secondary | ICD-10-CM | POA: Diagnosis present

## 2024-05-30 DIAGNOSIS — R52 Pain, unspecified: Secondary | ICD-10-CM | POA: Diagnosis not present

## 2024-05-30 DIAGNOSIS — Z880 Allergy status to penicillin: Secondary | ICD-10-CM | POA: Diagnosis not present

## 2024-05-30 DIAGNOSIS — R531 Weakness: Secondary | ICD-10-CM | POA: Diagnosis present

## 2024-05-30 DIAGNOSIS — Z7189 Other specified counseling: Secondary | ICD-10-CM | POA: Diagnosis not present

## 2024-05-30 DIAGNOSIS — C3411 Malignant neoplasm of upper lobe, right bronchus or lung: Secondary | ICD-10-CM | POA: Diagnosis present

## 2024-05-30 DIAGNOSIS — E43 Unspecified severe protein-calorie malnutrition: Secondary | ICD-10-CM | POA: Diagnosis present

## 2024-05-30 DIAGNOSIS — Z789 Other specified health status: Secondary | ICD-10-CM | POA: Diagnosis not present

## 2024-05-30 DIAGNOSIS — Z79899 Other long term (current) drug therapy: Secondary | ICD-10-CM | POA: Diagnosis not present

## 2024-05-30 DIAGNOSIS — G8929 Other chronic pain: Secondary | ICD-10-CM | POA: Diagnosis present

## 2024-05-30 DIAGNOSIS — R1310 Dysphagia, unspecified: Secondary | ICD-10-CM | POA: Diagnosis present

## 2024-05-30 DIAGNOSIS — Z8249 Family history of ischemic heart disease and other diseases of the circulatory system: Secondary | ICD-10-CM | POA: Diagnosis not present

## 2024-05-30 DIAGNOSIS — R55 Syncope and collapse: Secondary | ICD-10-CM | POA: Diagnosis present

## 2024-05-30 DIAGNOSIS — C7971 Secondary malignant neoplasm of right adrenal gland: Secondary | ICD-10-CM | POA: Diagnosis present

## 2024-05-30 DIAGNOSIS — R200 Anesthesia of skin: Secondary | ICD-10-CM | POA: Diagnosis not present

## 2024-05-30 DIAGNOSIS — F05 Delirium due to known physiological condition: Secondary | ICD-10-CM | POA: Diagnosis present

## 2024-05-30 DIAGNOSIS — C3490 Malignant neoplasm of unspecified part of unspecified bronchus or lung: Secondary | ICD-10-CM

## 2024-05-30 DIAGNOSIS — R569 Unspecified convulsions: Secondary | ICD-10-CM | POA: Diagnosis not present

## 2024-05-30 DIAGNOSIS — F17210 Nicotine dependence, cigarettes, uncomplicated: Secondary | ICD-10-CM | POA: Diagnosis present

## 2024-05-30 DIAGNOSIS — E860 Dehydration: Secondary | ICD-10-CM | POA: Diagnosis present

## 2024-05-30 DIAGNOSIS — I10 Essential (primary) hypertension: Secondary | ICD-10-CM | POA: Diagnosis present

## 2024-05-30 DIAGNOSIS — Z66 Do not resuscitate: Secondary | ICD-10-CM | POA: Diagnosis present

## 2024-05-30 DIAGNOSIS — R253 Fasciculation: Secondary | ICD-10-CM | POA: Diagnosis present

## 2024-05-30 DIAGNOSIS — G40A09 Absence epileptic syndrome, not intractable, without status epilepticus: Secondary | ICD-10-CM | POA: Diagnosis present

## 2024-05-30 DIAGNOSIS — G936 Cerebral edema: Secondary | ICD-10-CM | POA: Diagnosis present

## 2024-05-30 DIAGNOSIS — E871 Hypo-osmolality and hyponatremia: Secondary | ICD-10-CM | POA: Diagnosis present

## 2024-05-30 LAB — CBC
HCT: 33.4 % — ABNORMAL LOW (ref 39.0–52.0)
Hemoglobin: 11.3 g/dL — ABNORMAL LOW (ref 13.0–17.0)
MCH: 30.8 pg (ref 26.0–34.0)
MCHC: 33.8 g/dL (ref 30.0–36.0)
MCV: 91 fL (ref 80.0–100.0)
Platelets: 303 K/uL (ref 150–400)
RBC: 3.67 MIL/uL — ABNORMAL LOW (ref 4.22–5.81)
RDW: 13.2 % (ref 11.5–15.5)
WBC: 7.8 K/uL (ref 4.0–10.5)
nRBC: 0 % (ref 0.0–0.2)

## 2024-05-30 LAB — PROTIME-INR
INR: 1.1 (ref 0.8–1.2)
Prothrombin Time: 15 s (ref 11.4–15.2)

## 2024-05-30 LAB — COMPREHENSIVE METABOLIC PANEL WITH GFR
ALT: 13 U/L (ref 0–44)
AST: 48 U/L — ABNORMAL HIGH (ref 15–41)
Albumin: 2.8 g/dL — ABNORMAL LOW (ref 3.5–5.0)
Alkaline Phosphatase: 133 U/L — ABNORMAL HIGH (ref 38–126)
Anion gap: 10 (ref 5–15)
BUN: 8 mg/dL (ref 6–20)
CO2: 22 mmol/L (ref 22–32)
Calcium: 9.1 mg/dL (ref 8.9–10.3)
Chloride: 95 mmol/L — ABNORMAL LOW (ref 98–111)
Creatinine, Ser: 0.72 mg/dL (ref 0.61–1.24)
GFR, Estimated: >60 mL/min (ref >60)
Glucose, Bld: 130 mg/dL — ABNORMAL HIGH (ref 70–99)
Potassium: 4 mmol/L (ref 3.5–5.1)
Sodium: 127 mmol/L — ABNORMAL LOW (ref 135–145)
Total Bilirubin: 0.4 mg/dL (ref 0.0–1.2)
Total Protein: 6.6 g/dL (ref 6.5–8.1)

## 2024-05-30 MED ORDER — PREGABALIN 50 MG PO CAPS
50.0000 mg | ORAL_CAPSULE | Freq: Three times a day (TID) | ORAL | Status: DC
  Administered 2024-05-31 – 2024-06-01 (×3): 50 mg via ORAL
  Filled 2024-05-30 (×3): qty 1

## 2024-05-30 MED ORDER — PROMETHAZINE (PHENERGAN) 6.25MG IN NS 50ML IVPB
6.2500 mg | Freq: Four times a day (QID) | INTRAVENOUS | Status: DC | PRN
  Administered 2024-05-30: 6.25 mg via INTRAVENOUS
  Filled 2024-05-30: qty 6.25

## 2024-05-30 MED ORDER — LORAZEPAM 0.5 MG PO TABS: 0.5000 mg | ORAL_TABLET | ORAL | Status: DC | PRN

## 2024-05-30 MED ORDER — SODIUM CHLORIDE 0.9 % IV SOLN: INTRAVENOUS | Status: AC

## 2024-05-30 MED ORDER — DEXAMETHASONE 4 MG PO TABS
4.0000 mg | ORAL_TABLET | Freq: Three times a day (TID) | ORAL | Status: AC
  Administered 2024-05-30 (×2): 4 mg via ORAL
  Filled 2024-05-30 (×2): qty 1

## 2024-05-30 MED ORDER — ENSURE PLUS HIGH PROTEIN PO LIQD
237.0000 mL | Freq: Two times a day (BID) | ORAL | Status: DC
  Administered 2024-05-30 (×2): 237 mL via ORAL

## 2024-05-30 MED ORDER — PREGABALIN 25 MG PO CAPS
25.0000 mg | ORAL_CAPSULE | Freq: Once | ORAL | Status: AC
  Administered 2024-05-30: 25 mg via ORAL

## 2024-05-30 MED ORDER — PREGABALIN 25 MG PO CAPS
25.0000 mg | ORAL_CAPSULE | Freq: Three times a day (TID) | ORAL | Status: DC
  Administered 2024-05-30 (×2): 25 mg via ORAL
  Filled 2024-05-30 (×2): qty 1

## 2024-05-30 NOTE — NC FL2 (Addendum)
 [place]  MEDICAID FL2 LEVEL OF CARE FORM     IDENTIFICATION  Patient Name: Lucas Yu Birthdate: 06/30/1965 Sex: male Admission Date (Current Location): 05/29/2024  [place] Number:  Chiropodist and Address:  [facility], [address], [place], [place] [address]      Provider Number: [id number]  Attending Physician Name and Address:  Lenon Marien CROME, MD  Relative Name and Phone Number:       Current Level of Care: Hospital Recommended Level of Care: Skilled Nursing Facility Prior Approval Number:    Date Approved/Denied:   PASRR Number:  [id number]   Discharge Plan: SNF    Current Diagnoses: Patient Active Problem List   Diagnosis Date Noted   Syncope and collapse 05/29/2024   Status epilepticus (HCC) 05/18/2024   Lung cancer metastatic to brain (HCC) 05/17/2024   Lung mass 05/03/2024   Essential hypertension 05/03/2024   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   History of cigarette smoking 03/13/2020   Elevated liver enzymes 01/03/2020   Encounter to establish care 11/24/2019   Left hip pain 11/24/2019   Numbness and tingling of left leg 11/24/2019    Orientation RESPIRATION BLADDER Height & Weight     Self    Continent Weight: 75.8 kg Height:  5' 7 (170.2 cm)  BEHAVIORAL SYMPTOMS/MOOD NEUROLOGICAL BOWEL NUTRITION STATUS      Continent Diet  AMBULATORY STATUS COMMUNICATION OF NEEDS Skin   Extensive Assist Verbally                         Personal Care Assistance Level of Assistance  Bathing, Feeding, Dressing Bathing Assistance: Maximum assistance Feeding assistance: Limited assistance Dressing Assistance: Maximum assistance     Functional Limitations Info             SPECIAL CARE FACTORS FREQUENCY  PT (By licensed PT), OT (By licensed OT)     PT Frequency: 5 x week OT Frequency: 5 x week            Contractures Contractures Info: Present    Additional  Factors Info  Code Status, Allergies Code Status Info: DNR Limited Allergies Info: Penicillins           Current Medications (05/30/2024):  This is the current hospital active medication list Current Facility-Administered Medications  Medication Dose Route Frequency Provider Last Rate Last Admin   0.9 %  sodium chloride  infusion   Intravenous Continuous Lenon Marien CROME, MD 50 mL/hr at 05/30/24 1149 New Bag at 05/30/24 1149   acetaminophen  (TYLENOL ) tablet 650 mg  650 mg Oral Q6H PRN Paudel, Keshab, MD       Or   acetaminophen  (TYLENOL ) suppository 650 mg  650 mg Rectal Q6H PRN Paudel, Nena, MD       cyanocobalamin  (VITAMIN B12) tablet 1,000 mcg  1,000 mcg Oral Daily Paudel, Keshab, MD   1,000 mcg at 05/30/24 0843   dexamethasone  (DECADRON ) tablet 4 mg  4 mg Oral Q8H Lenon Marien CROME, MD   4 mg at 05/30/24 0843   enoxaparin  (LOVENOX ) injection 40 mg  40 mg Subcutaneous Q24H Paudel, Nena, MD   40 mg at 05/29/24 2105   feeding supplement (ENSURE PLUS HIGH PROTEIN) liquid 237 mL  237 mL Oral BID BM Lenon Marien L, MD   237 mL at 05/30/24 1012   gabapentin  (NEURONTIN ) capsule 400 mg  400 mg Oral Daily PRN Roann Nena, MD  HYDROmorphone  (DILAUDID ) injection 1 mg  1 mg Intravenous Q4H PRN Paudel, Keshab, MD   1 mg at 05/30/24 1144   levETIRAcetam  (KEPPRA ) tablet 1,500 mg  1,500 mg Oral BID Paudel, Keshab, MD   1,500 mg at 05/30/24 9157   loratadine  (CLARITIN ) tablet 10 mg  10 mg Oral Daily Paudel, Nena, MD   10 mg at 05/30/24 9156   LORazepam  (ATIVAN ) tablet 0.5 mg  0.5 mg Oral Q4H PRN Paudel, Keshab, MD       nicotine  (NICODERM CQ  - dosed in mg/24 hr) patch 7 mg  7 mg Transdermal Daily Paudel, Nena, MD   7 mg at 05/30/24 0854   ondansetron  (ZOFRAN ) tablet 4 mg  4 mg Oral Q6H PRN Roann Nena, MD       Or   ondansetron  (ZOFRAN ) injection 4 mg  4 mg Intravenous Q6H PRN Paudel, Keshab, MD   4 mg at 05/29/24 1501   oxyCODONE  (Oxy IR/ROXICODONE ) immediate release tablet  5 mg  5 mg Oral Q4H PRN Paudel, Keshab, MD   5 mg at 05/30/24 0842   pantoprazole  (PROTONIX ) EC tablet 40 mg  40 mg Oral Daily Paudel, Keshab, MD   40 mg at 05/30/24 9156   phenytoin  (DILANTIN ) chewable tablet 150 mg  150 mg Oral BID Paudel, Keshab, MD   150 mg at 05/30/24 0843   polyethylene glycol (MIRALAX  / GLYCOLAX ) packet 17 g  17 g Oral Daily PRN Paudel, Keshab, MD       pregabalin  (LYRICA ) capsule 25 mg  25 mg Oral Q8H Lenon Pons L, MD   25 mg at 05/30/24 9156   sucralfate  (CARAFATE ) tablet 1 g  1 g Oral QID Paudel, Keshab, MD   1 g at 05/30/24 9156     Discharge Medications: Please see discharge summary for a list of discharge medications.  Relevant Imaging Results:  Relevant Lab Results:   Additional Information SSN  [id number]  Dalia GORMAN Fuse, RN

## 2024-05-30 NOTE — Progress Notes (Signed)
 Eeg done

## 2024-05-30 NOTE — Procedures (Signed)
 Patient Name: Lucas Yu  MRN: [id number]  Epilepsy Attending: Arlin MALVA Krebs  Referring Physician/Provider: Germaine Raring, MD  Date: 05/30/2024 Duration: 26.38 mins  Patient history: 58 y.o. male with medical history significant for metastatic lung cancer with mets to the brain currently on active radiation therapy, seizures, HTN noted to have multiple staring spells. EEG to evaluate for seizure  Level of alertness: Awake  AEDs during EEG study: LEV, PHT, PGB  Technical aspects: This EEG study was done with scalp electrodes positioned according to the 10-20 International system of electrode placement. Electrical activity was reviewed with band pass filter of 1-70Hz , sensitivity of 7 uV/mm, display speed of 43mm/sec with a 60Hz  notched filter applied as appropriate. EEG data were recorded continuously and digitally stored.  Video monitoring was available and reviewed as appropriate.  Description: The posterior dominant rhythm consists of 8 Hz activity of moderate voltage (25-35 uV) seen predominantly in posterior head regions, symmetric and reactive to eye opening and eye closing. EEG showed intermittent generalized 3 to 6 Hz theta-delta slowing. Hyperventilation and photic stimulation were not performed.     During the study at 1515, patient reported having a seizure. Concomitant EEG before, during and after the event showed normal posterior dominant rhythm and did not show any EEG changes  to suggest seizure.   ABNORMALITY - Intermittent slow, generalized   IMPRESSION: This study is suggestive of generalized cerebral dysfunction ( encephalopathy). No seizures or epileptiform discharges were seen throughout the recording.  During the study at 1515, patient reported having a seizure without concomitant EEG change. This was mot likely not an epileptic event.    Gregory Dowe O Garyn [place]

## 2024-05-30 NOTE — Evaluation (Signed)
 Occupational Therapy Evaluation Patient Details Name: FRANSICO SCIANDRA MRN: [id number] DOB: 13-Dec-1965 Today's Date: 05/30/2024   History of Present Illness   Pt is a 58 y.o. male who presented to ED with the complaints of right arm and leg numbness, weakness and uncontrolled pain now for several days. Of note, recently seen at [facility] and left AMA, seen again at [facility] and discharged, now back with uncontrolled pain and Back into stare/syncopal episode. PMH of metastatic lung cancer with mets to the brain currently on active radiation therapy, HTN, marijuana and tobacco use.     Clinical Impressions Pt was seen for OT evaluation this date. PTA, patient reports mobilizing with rollator, having assist for ADLs/ IADLs as needed from aide and fiancee however recently requiring increased time and assist for all tasks and it being too much on his family/caregivers.    Pt presents with deficits in strength, pain management, balance, cognition, coordination, and safety awareness, affecting safe and optimal ADL completion. Pt currently requires Mod/Max A for bed mobility with increased time and cues for initiation and sequencing/technique. He is oriented to person, unsure of place. Required CGA and unilateral support on bed to maintain seated balance while taking his meds with increased time and cues to swallow, notable RUE tremors while trying to drink water. Mod A required for lift off and posterior lean to stand from EOB to RW with cues for hand placement and safety, sequencing/step by step instructions. Min A with RW use to take slow, effortful lateral steps to [facility] prior to return to supine. He is noted with blank stare moments ~3 times during session for ~20 seconds requiring verbal cues to refocus. Min progressing to supervision to self feed with return to bed using RUE. Edema to RUE from possible infiltrated IV-nurse removed during session and was elevated upon return to bed. Pt would benefit from  skilled OT services to address noted impairments and functional limitations to maximize safety and independence while minimizing future risk of falls, injury, and readmission. Do anticipate the need for follow up OT services upon acute hospital DC.       If plan is discharge home, recommend the following:   Assistance with cooking/housework;Assist for transportation;Help with stairs or ramp for entrance;A lot of help with walking and/or transfers;A lot of help with bathing/dressing/bathroom     Functional Status Assessment   Patient has had a recent decline in their functional status and demonstrates the ability to make significant improvements in function in a reasonable and predictable amount of time.     Equipment Recommendations   Other (comment) (defer to next venue)     Recommendations for Other Services         Precautions/Restrictions         Mobility Bed Mobility Overal bed mobility: Needs Assistance Bed Mobility: Supine to Sit, Sit to Supine     Supine to sit: Mod assist, HOB elevated, Used rails Sit to supine: Max assist, Mod assist   General bed mobility comments: cues for initiation and technique/sequencing d/t cognitive deficits, blank stare moments, CGA to maintain seated balance at EOB while taking meds    Transfers Overall transfer level: Needs assistance Equipment used: Rolling walker (2 wheels) Transfers: Sit to/from Stand Sit to Stand: Mod assist           General transfer comment: initial posterior lean upon standing requiring increased assist to achieve upright balance progressing to Min/CGA for static standing balance, able to take lateral side steps to  HOB with increased time and max cueing for initiation and sequencing      Balance Overall balance assessment: Needs assistance Sitting-balance support: Feet supported, Single extremity supported Sitting balance-Leahy Scale: Fair   Postural control: Posterior lean Standing  balance support: Reliant on assistive device for balance, Bilateral upper extremity supported Standing balance-Leahy Scale: Poor Standing balance comment: external support required and RW                           ADL either performed or assessed with clinical judgement   ADL Overall ADL's : Needs assistance/impaired Eating/Feeding: Minimal assistance;Sitting;Bed level;Supervision/ safety Eating/Feeding Details (indicate cue type and reason): RUE Tremors while drinking water, required all breakfast food to be cut up prior to eating, cues for initiation Grooming: Wash/dry face;Bed level;Supervision/safety Grooming Details (indicate cue type and reason): cues for initiation                                     Vision         Perception         Praxis         Pertinent Vitals/Pain Pain Assessment Pain Assessment: 0-10 Pain Score: 10-Worst pain ever Pain Location: R sided all over Pain Descriptors / Indicators: Aching, Sore Pain Intervention(s): Monitored during session, Repositioned, RN gave pain meds during session     Extremity/Trunk Assessment Upper Extremity Assessment Upper Extremity Assessment: Generalized weakness   Lower Extremity Assessment Lower Extremity Assessment: Generalized weakness       Communication Communication Communication: Impaired Factors Affecting Communication: Reduced clarity of speech   Cognition Arousal: Alert, Lethargic Behavior During Therapy: Lability                                 Following commands: Impaired Following commands impaired: Follows one step commands inconsistently, Follows one step commands with increased time     Cueing  General Comments   Cueing Techniques: Verbal cues;Gestural cues;Tactile cues  noted with cough during session, blank stares moments and tearful of situtation   Exercises Other Exercises Other Exercises: Edu on role of OT in acute setting and DC  recommendations.   Shoulder Instructions      Home Living Family/patient expects to be discharged to:: Private residence Living Arrangements: Spouse/significant other Available Help at Discharge: Family;Available 24 hours/day Type of Home: Apartment Home Access: Level entry;Stairs to enter Entrance Stairs-Number of Steps: 3 Entrance Stairs-Rails: None Home Layout: One level     Bathroom Shower/Tub: Chief Strategy Officer: Standard Bathroom Accessibility: Yes   Home Equipment: Rollator (4 wheels);Cane - single point          Prior Functioning/Environment Prior Level of Function : Needs assist             Mobility Comments: per chart review 3weeks ago during admission, pt with no falls and using rollator for mobility ADLs Comments: reports fiance is assisting with all tasks; cart review states he has an aide 2 hrs/day 5 days a week assisting with ADLs/IADLs as needed; assist with showers, IADLs    OT Problem List: Decreased strength;Decreased activity tolerance;Impaired balance (sitting and/or standing);Decreased safety awareness;Decreased cognition;Decreased coordination   OT Treatment/Interventions: Self-care/ADL training;Therapeutic exercise;Balance training;Therapeutic activities;DME and/or AE instruction;Patient/family education      OT Goals(Current goals can be found in  the care plan section)   Acute Rehab OT Goals Patient Stated Goal: get better OT Goal Formulation: With patient Time For Goal Achievement: 06/13/24 Potential to Achieve Goals: Fair ADL Goals Pt Will Perform Grooming: with supervision;standing Pt Will Perform Lower Body Dressing: with contact guard assist;with supervision;sit to/from stand;sitting/lateral leans Pt Will Transfer to Toilet: with contact guard assist;ambulating   OT Frequency:  Min 2X/week    Co-evaluation              AM-PAC OT 6 Clicks Daily Activity     Outcome Measure Help from another person eating  meals?: A Little Help from another person taking care of personal grooming?: A Lot Help from another person toileting, which includes using toliet, bedpan, or urinal?: A Lot Help from another person bathing (including washing, rinsing, drying)?: A Lot Help from another person to put on and taking off regular upper body clothing?: A Lot Help from another person to put on and taking off regular lower body clothing?: A Lot 6 Click Score: 13   End of Session Equipment Utilized During Treatment: Gait belt;Rolling walker (2 wheels) Nurse Communication: Mobility status  Activity Tolerance: Patient tolerated treatment well;Patient limited by pain Patient left: in bed;with call bell/phone within reach;with bed alarm set;with nursing/sitter in room  OT Visit Diagnosis: Other abnormalities of gait and mobility (R26.89);Muscle weakness (generalized) (M62.81)                Time: 9166-9091 OT Time Calculation (min): 35 min Charges:  OT General Charges $OT Visit: 1 Visit OT Evaluation $OT Eval Moderate Complexity: 1 Mod OT Treatments $Self Care/Home Management : 8-22 mins  Charlaine Utsey Chrismon, OTR/L 05/30/24, 10:15 AM  Gredmarie Delange E Chrismon 05/30/2024, 10:10 AM

## 2024-05-30 NOTE — Consult Note (Signed)
 Reason for Consult:Starring spells Requesting Physician: Lenon  CC: Starring spells  I have been asked by Dr. Lenon to see this patient in consultation for possible breakthrough seizures.  HPI: Lucas Yu is an 58 y.o. male with medical history significant for metastatic lung cancer with mets to the brain currently on active radiation therapy, seizures, HTN who presented to ED with the complaints of right arm and leg numbness, weakness and uncontrolled pain now for several days.   Was noted on day of admission to have vacant stare by family.  Had multiple prior to EMS arrival.  Has continued to have them frequently throughout the day.  Recently admitted for frequent focal seizures.  Currently on Keppra  and Dilantin .  Dilantin  level is therapeutic and Keppra  is at maximum dose.   Past Medical History:  Diagnosis Date   Brain tumor (benign) (HCC)    Hypertension    Lung cancer (HCC)     Past Surgical History:  Procedure Laterality Date   VIDEO BRONCHOSCOPY WITH ENDOBRONCHIAL NAVIGATION N/A 05/05/2024   Procedure: VIDEO BRONCHOSCOPY WITH ENDOBRONCHIAL NAVIGATION;  Surgeon: Kara Dorn NOVAK, MD;  Location: [facility] ENDOSCOPY;  Service: Pulmonary;  Laterality: N/A;   VIDEO BRONCHOSCOPY WITH ENDOBRONCHIAL ULTRASOUND Right 05/05/2024   Procedure: BRONCHOSCOPY, WITH EBUS;  Surgeon: Kara Dorn NOVAK, MD;  Location: [facility] ENDOSCOPY;  Service: Pulmonary;  Laterality: Right;    Family History  Problem Relation Age of Onset   Hypertension Father    Cancer Father     Social History:  reports that he has quit smoking. His smoking use included cigarettes. He has a 44 pack-year smoking history. He has never used smokeless tobacco. He reports that he does not currently use alcohol after a past usage of about 7.0 standard drinks of alcohol per week. He reports current drug use. Drug: Marijuana.  Allergies  Allergen Reactions   Penicillins Hives    Medications: I have reviewed the patient's  current medications. Scheduled:  cyanocobalamin   1,000 mcg Oral Daily   dexamethasone   4 mg Oral Q8H   enoxaparin  (LOVENOX ) injection  40 mg Subcutaneous Q24H   feeding supplement  237 mL Oral BID BM   levETIRAcetam   1,500 mg Oral BID   loratadine   10 mg Oral Daily   nicotine   7 mg Transdermal Daily   pantoprazole   40 mg Oral Daily   phenytoin   150 mg Oral BID   pregabalin   25 mg Oral Q8H   sucralfate   1 g Oral QID    ROS: History obtained from girlfriend and patient  General ROS: anorexia, sweats Psychological ROS: negative for - behavioral disorder, hallucinations, memory difficulties, mood swings or suicidal ideation Ophthalmic ROS: negative for - blurry vision, double vision, eye pain or loss of vision ENT ROS: negative for - epistaxis, nasal discharge, oral lesions, sore throat, tinnitus or vertigo Allergy and Immunology ROS: negative for - hives or itchy/watery eyes Hematological and Lymphatic ROS: negative for - bleeding problems, bruising or swollen lymph nodes Endocrine ROS: negative for - galactorrhea, hair pattern changes, polydipsia/polyuria or temperature intolerance Respiratory ROS: cough, shortness of breath  Cardiovascular ROS: negative for - chest pain, dyspnea on exertion, edema or irregular heartbeat Gastrointestinal ROS: nausea/vomiting  Genito-Urinary ROS: negative for - dysuria, hematuria, incontinence or urinary frequency/urgency Musculoskeletal ROS: negative for - joint swelling or muscular weakness Neurological ROS: as noted in HPI Dermatological ROS: negative for rash and skin lesion changes   Physical Examination: Blood pressure 125/87, pulse 98, temperature 98.2 F (36.8 C),  resp. rate 20, height 5' 7 (1.702 m), weight 75.8 kg, SpO2 100%.  HEENT-  Normocephalic, no lesions, without obvious abnormality.  Normal external eye and conjunctiva.  Normal auditory canals and external ears. Normal external nose, mucus membranes and septum.  Cardiovascular-  Single S1, S2 Extremities- no edema Skin-warm and dry, no hyperpigmentation, vitiligo, or suspicious lesions  Neurological Examination   Mental Status: Alert, oriented.  Speech fluent without evidence of aphasia.  Able to follow commands.  Patient uncomfortable and having difficulty cooperating with examination.   Cranial Nerves: III,IV, VI: ptosis not present, extra-ocular motions intact bilaterally V,VII: Left facial droop, decreased sensation on left side of face VIII: hearing normal bilaterally XI: bilateral shoulder shrug XII: midline tongue extension Motor: Weakly lifts all extremities against gravity Sensory: Pinprick and light touch decreased in the LUE    Laboratory Studies:   Basic Metabolic Panel: Recent Labs  Lab 05/27/24 0737 05/29/24 0452 05/29/24 1026 05/30/24 0635  NA 125* 128*  --  127*  K 4.6 4.5  --  4.0  CL 90* 91*  --  95*  CO2 22 24  --  22  GLUCOSE 111* 102*  --  130*  BUN 23* 19  --  8  CREATININE 1.03 1.02 0.87 0.72  CALCIUM 9.9 9.9  --  9.1  MG  --  2.5*  --   --     Liver Function Tests: Recent Labs  Lab 05/27/24 0737 05/29/24 0452 05/30/24 0635  AST 53* 69* 48*  ALT 23 29 13   ALKPHOS 147* 164* 133*  BILITOT 0.4 0.4 0.4  PROT 8.3* 8.2* 6.6  ALBUMIN 3.8 3.7 2.8*   No results for input(s): LIPASE, AMYLASE in the last 168 hours. No results for input(s): AMMONIA in the last 168 hours.  CBC: Recent Labs  Lab 05/27/24 0737 05/29/24 0452 05/29/24 1026 05/30/24 0635  WBC 9.1 9.6 8.7 7.8  HGB 13.6 13.5 12.4* 11.3*  HCT 40.1 39.4 36.9* 33.4*  MCV 89.1 89.7 90.9 91.0  PLT 343 352 332 303    Cardiac Enzymes: Recent Labs  Lab 05/29/24 0452  CKTOTAL 87    BNP: Invalid input(s): POCBNP  CBG: Recent Labs  Lab 05/29/24 0444  GLUCAP 96    Microbiology: Results for orders placed or performed during the hospital encounter of 05/23/24  Culture, blood (routine x 2)     Status: None   Collection Time: 05/23/24 12:11 PM    Specimen: BLOOD LEFT FOREARM  Result Value Ref Range Status   Specimen Description BLOOD LEFT FOREARM  Final   Special Requests   Final    BOTTLES DRAWN AEROBIC AND ANAEROBIC Blood Culture results may not be optimal due to an inadequate volume of blood received in culture bottles   Culture   Final    NO GROWTH 5 DAYS Performed at [facility], [address], [place], [place] [address]    Report Status 05/28/2024 FINAL  Final    Coagulation Studies: Recent Labs    05/30/24 0635  LABPROT 15.0  INR 1.1    Urinalysis:  Recent Labs  Lab 05/29/24 0447  COLORURINE YELLOW*  LABSPEC 1.032*  PHURINE 5.0  GLUCOSEU NEGATIVE  HGBUR NEGATIVE  BILIRUBINUR NEGATIVE  KETONESUR 5*  PROTEINUR NEGATIVE  NITRITE NEGATIVE  LEUKOCYTESUR NEGATIVE    Lipid Panel:     Component Value Date/Time   CHOL 145 11/30/2019 1058   TRIG 73 11/30/2019 1058   HDL 74 11/30/2019 1058   CHOLHDL  2.0 11/30/2019 1058   LDLCALC 57 11/30/2019 1058    HgbA1C:  Lab Results  Component Value Date   HGBA1C 5.5 11/30/2019    Urine Drug Screen:  No results found for: LABOPIA, COCAINSCRNUR, LABBENZ, AMPHETMU, THCU, LABBARB  Alcohol Level: No results for input(s): ETH in the last 168 hours.   Imaging: MR Brain W and Wo Contrast Result Date: 05/29/2024 EXAM: MRI BRAIN WITH AND WITHOUT CONTRAST 05/29/2024 07:22:47 AM TECHNIQUE: Multiplanar multisequence MRI of the head/brain was performed with and without the administration of 7 mL of gadobutrol  (GADAVIST ) 1 MMOL/ML injection. COMPARISON: MRI of the head dated 05/10/2024. 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 FINDINGS: LIMITATIONS/ARTIFACTS: This study is limited by patient motion. BRAIN AND VENTRICLES: No acute infarct. No acute intracranial hemorrhage. No mass effect or midline shift. No hydrocephalus. The sella is unremarkable.  Normal flow voids. A round peripherally enhancing mass is again demonstrated in the right posterior frontal lobe, again measuring approximately 18 mm in diameter. There is moderate surrounding vasogenic edema. A round enhancing mass is also again demonstrated at the left parietooccipital junction, again measuring 11 to 12 mm in diameter, similarly unchanged. A small subcentimeter lesion is again demonstrated within the left cerebellar hemisphere, best visualized on coronal image 12 of series 19. The punctate lesion previously seen medially within the left frontal lobe is not convincingly demonstrated on the current exam, but the axial images are significantly limited. There are no new lesions evident. ORBITS: No acute abnormality. SINUSES: No acute abnormality. BONES AND SOFT TISSUES: Normal bone marrow signal and enhancement. No acute soft tissue abnormality. IMPRESSION: 1. Stable round peripherally enhancing masses in the right posterior frontal lobe (18 mm) and left parietooccipital junction (11-12 mm) with moderate surrounding vasogenic edema. 2. Small subcentimeter lesion in the left cerebellar hemisphere. 3. No new lesions evident. Evaluation limited by patient motion. Electronically signed by: Evalene Coho MD 05/29/2024 08:07 AM EST RP Workstation: [id number]     Assessment/Plan:  58 y.o. male with medical history significant for metastatic lung cancer with mets to the brain currently on active radiation therapy, seizures, HTN noted to have multiple staring spells.  One witnessed by examiner during evaluation.  Likely breakthrough seizures.  Currently on Keppra  and Dilantin .  Dilantin  level is therapeutic and Keppra  is at maximum dose.  Also on Lyrica  as well which also has antiepileptic properties but is not being used at doses that would be therapeutic.   MRI of the brain personally reviewed and is stable with right posterior frontal lobe, left parieto-occipital junction and left cerebellar  lesions.     Recommendations: Increase Lyrica  to 50mg  TID.  If necessary may increase further to 100mg  TID.   EEG. Seizure precautions If patient unable to take po would change Keppra  to IV at 1500mg  q 12 hours and Dilantin  to IV at 100mg  IV q 8 hours.  Based on seizure response may also need to add Lacosamide at 100mg  q 12 hours since this has an IV preparation and Lyrica  does not  Sonny Hock, MD Neurology  05/30/2024, 2:07 PM

## 2024-05-30 NOTE — Consult Note (Signed)
 Palliative Care Consult Note                                  Date: 05/30/2024   Patient Name: Lucas Yu  DOB: [birth year]  MRN: [id number]  Age / Sex: 58 y.o., male  PCP: [facility] Referring Physician: Lenon Marien CROME, MD  Reason for Consultation: Establishing goals of care  Past Medical History:  Diagnosis Date   Brain tumor (benign) [facility])    Hypertension    Lung cancer (HCC)     Subjective:   This NP Camellia Kays reviewed medical records, received report from team, assessed the patient and then meet at the patient's bedside to discuss diagnosis, prognosis, GOC, EOL wishes disposition and options.  Before meeting with the patient/family, I spent time reviewing the chart notes including admission H&P from yesterday, ED notes from yesterday, nursing note from today, family medicine note from today. I also reviewed vital signs, nursing flowsheets, medication administrations record, labs, and imaging. Labs reviewed include CBC today which is reassuring for no leukocytosis in the setting of status post radiation therapy and metastatic stage IV cancer.  CMP shows low albumin at 2.8 consistent with fiancs reports of patient not eating for several days because of significant nausea.  Noted hyponatremia but normal potassium in the setting of poor intake.  I met with the patient at the bedside.  His fiance Ernestene is also present.   We meet to discuss diagnosis prognosis, GOC, EOL wishes, disposition and options. Concept of Palliative Care was introduced as specialized medical care for people and their families living with serious illness.  If focuses on providing relief from the symptoms and stress of a serious illness.  The goal is to improve quality of life for both the patient and the family. Values and goals of care important to patient and family were attempted to be elicited.  Created space and opportunity for  patient  and family to explore thoughts and feelings regarding current medical situation   Natural trajectory and current clinical status were discussed. Questions and concerns addressed. Patient  encouraged to call with questions or concerns.    Patient/Family Understanding of Illness: They understand that he came to the hospital due to staring and not responsive since day of admission.  He initially had problems on Halloween with seizure-like activity but he delayed coming to the hospital for a week.  In and around that time multiple workup studies were completed including a CT of the chest, MRI of the brain.  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  He was seen by oncology at [facility] long and was told this is incurable and treatment would be palliative to allow for more time and symptom control.  He underwent radiation treatment.  Previous oncology note in the [facility] serological molecular testing completed and possible options for immunotherapy versus chemotherapy pending test results.  We spent time talking about significant illness, fiancs impression of rapid change in situation.  Life Review: The patient has been with his fiance for 26 years, they have been engaged for 4 years.  The patient has a daughter who lives in [place] from a previous relationship.  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  He thoroughly enjoys fishing.  Baseline Status: Prior to substantial changes in diagnosis of  cancer the patient was functionally independent, although disabled and unable to work.  Notably in the past few days the patient has had increasing pain, a lot of nausea, unable to eat much.  Today's Discussion: In addition to discussions described above we had extensive discussion of various topics.  Again we talked through cancer, the cost of treatments including significant symptoms  such as nausea, vomiting, pain, fatigue, weakness.  We discussed significant nausea the patient is having as possibly a side effect of brain metastasis and/or radiation treatment.  Less likely opioids as he did not appear to be on these prior to admission.  We also talked about the staring spells that he is being evaluated for by neurology.  Per neurology this is a possible side effect of Keppra  but more likely breakthrough seizures.  Planning for EEG today.  During my visit with the patient and family, the patient frequently becomes nauseated but is not able to vomit.  He makes multiple comments of I do not I am going to do this.  His fiance is very supportive of the patient and we celebrated their commitment to each other and he shares he would do the same thing for her if his roles were reversed.  I shared that, depending on how the patient does over the coming days, we may had to have more difficult discussions.  If he does not improve there is a possibility that treatment options may be limited.  However, at this point everyone is continuing to hope for the best.  The patient seems concerned about the possibility of more difficult decisions and discussions.  I also talked about the possibility of outpatient palliative care for ongoing symptom management if his symptoms become progressively worse.  Finally, we talked about the palliative nature of treatment and the understanding that, even if not sooner, at some point end-of-life discussions would need to be held when the patient is at that point.  His fiance understands.  For now, they feel it is too early to make significant decisions.  We did talk about CODE STATUS and confirmed desire for DNR/DNI.  However, they would like to continue full scope of treatment at this time including neurological workup and medications to help with symptoms.  They would like to allow some time for outcomes to see if he can get better.  They remain open to oncology  treatments moving forward at this time.  I shared that I would follow-up tomorrow to check in on the patient and his fiance and see how they are doing.  Palliative medicine will continue to follow for ongoing goals of care conversations pending evolution of his clinical picture. I provided emotional and general support through therapeutic listening, empathy, sharing of stories, and other techniques. I answered all questions and addressed all concerns to the best of my ability.  Goals: DNR/DNI, full scope of care otherwise, time for outcomes.  Patient and family are hopeful for improvement.  Review of Systems  Constitutional:        Describes pain on his right side, unable to tell me if medications are effective.  No objective signs of pain during my visit.  Respiratory:  Negative for shortness of breath.   Gastrointestinal:  Positive for nausea. Negative for vomiting.    Objective:   Primary Diagnoses: Present on Admission:  Syncope and collapse  Essential hypertension  Lung cancer metastatic to brain [facility])   Vital Signs:  BP 125/87 (BP Location: Right Arm)   Pulse 98  Temp 98.2 F (36.8 C)   Resp 20   Ht 5' 7 (1.702 m)   Wt 75.8 kg   SpO2 100%   BMI 26.16 kg/m   Physical Exam Vitals and nursing note reviewed.  Constitutional:      General: He is not in acute distress.    Appearance: Normal appearance. He is ill-appearing.     Comments: Frequently falls asleep  HENT:     Head: Normocephalic and atraumatic.  Cardiovascular:     Rate and Rhythm: Normal rate.  Pulmonary:     Effort: Pulmonary effort is normal. No respiratory distress.  Abdominal:     General: Abdomen is flat.  Skin:    General: Skin is warm and dry.  Neurological:     General: No focal deficit present.     Mental Status: He is alert and oriented to person, place, and time.  Psychiatric:        Mood and Affect: Mood normal.        Behavior: Behavior normal.     Palliative Assessment/Data:  TBD   Assessment & Plan:   HPI/Patient Profile: 58 y.o. male  with past medical history of metastatic lung cancer with mets to the brain currently on active radiation therapy, HTN.  He presented from home to the ED on 05/29/2024 with a five minute unresponsive episode. He was admitted on 05/29/2024 with blank stare/syncopal episode, uncontrolled pain, lung cancer with brain metastasis, and others.   Palliative medicine was consulted for GOC conversations.  SUMMARY OF RECOMMENDATIONS   DNR-Limited (DNR/DNI) Full scope of care otherwise Time for outcomes and evaluation/answers Palliative medicine will continue to follow  Symptom Management:  Per primary team Palliative medicine is available to assist as needed  Code Status: DNR - Limited (DNR/DNI)  Prognosis:  Unable to determine  Discharge Planning:  To Be Determined   Discussed with: Patient, family, medical team, nursing team    Thank you for allowing us  to participate in the care of DAMAN STEFFENHAGEN PMT will continue to support holistically.  Time Total: 80 min  Detailed review of medical records (labs, imaging, vital signs), medically appropriate exam, discussed with treatment team, counseling and education to patient, family, & staff, documenting clinical information, medication management, coordination of care  Signed by: Camellia Kays, NP Palliative Medicine Team  Team Phone # [phone] (Nights/Weekends)  05/30/2024, 1:20 PM

## 2024-05-30 NOTE — Progress Notes (Signed)
 PROGRESS NOTE Lucas Yu    DOB: 07/29/1965, 58 y.o.  FMW:[id number]    Code Status: Limited: Do not attempt resuscitation (DNR) -DNR-LIMITED -Do Not Intubate/DNI    DOA: 05/29/2024   LOS: 0  Brief hospital course  Lucas Yu is a 58 y.o. male with a PMH significant for metastatic lung cancer with mets to the brain currently on active radiation therapy, HTN.  They presented from home to the ED on 05/29/2024 with a five minute unresponsive episode.  In the ED, it was found that they had stable vital signs and were asymptomatic.  Significant findings included mostly unremarkable workup but had several more episodes of unresponsiveness which were witnessed by staff.  05/30/24 -patient alert and had 5 second episode witnessed during our encounter appeared to be consistent with absence seizure  Assessment & Plan  Principal Problem:   Syncope and collapse Active Problems:   Essential hypertension   Lung cancer metastatic to brain (HCC)  Blank stare/syncopal episode- negative post-ictal state. Consistent with absence seizure on observation today. Has been adherent with his seizure ppx - neurology is consulted - EEG ordered - continue keppra  and dilantin  - adding on lyrica  TID - supportive care. Continue steroids   Uncontrolled pain - continued his gabapentin , dexamethasone  from home, oxycodone , Tylenol . - Continue to monitor pain and try to control his pain as much as possible   HTN - Continue his home medications    Lung cancer with brain metastasis - Patient is under radiation therapy. - oncology consulted on admission. Palliative also consulted   Deconditioning PT/OT  Body mass index is 26.16 kg/m.  VTE ppx: enoxaparin  (LOVENOX ) injection 40 mg Start: 05/29/24 2200 SCDs Start: 05/29/24 1026  Diet:     Diet   Diet regular Room service appropriate? Yes; Fluid consistency: Thin   Consultants: Neurology  Oncology  Palliative   Subjective 05/30/24    Pt reports  having significant chronic pain along his whole right side. Shortness of breath.    Objective  Blood pressure 127/82, pulse 73, temperature (!) 96.5 F (35.8 C), temperature source Oral, resp. rate 18, height 5' 7 (1.702 m), weight 75.8 kg, SpO2 100%.  Intake/Output Summary (Last 24 hours) at 05/30/2024 0730 Last data filed at 05/29/2024 1700 Gross per 24 hour  Intake 569.01 ml  Output --  Net 569.01 ml   Filed Weights   05/29/24 0445  Weight: 75.8 kg    Physical Exam:  General: awake, alert HEENT: atraumatic, clear conjunctiva, anicteric sclera, MMM, hearing grossly normal Respiratory: normal respiratory effort. CTAB Cardiovascular: extremities well perfused Nervous: A&O x3. Had about 5 second episode of unresponsive with loss of tear from right eye and spontaneously returned to baseline Extremities: moves all equally, no edema, normal tone Skin: dry, intact, normal temperature, normal color. No rashes, lesions or ulcers on exposed skin Psychiatry: tearful, anxious  Labs   I have personally reviewed the following labs and imaging studies CBC    Component Value Date/Time   WBC 7.8 05/30/2024 0635   RBC 3.67 (L) 05/30/2024 0635   HGB 11.3 (L) 05/30/2024 0635   HGB 12.9 (L) 05/12/2024 1324   HGB 14.9 11/30/2019 1058   HCT 33.4 (L) 05/30/2024 0635   HCT 44.1 11/30/2019 1058   PLT 303 05/30/2024 0635   PLT 493 (H) 05/12/2024 1324   PLT 231 11/30/2019 1058   MCV 91.0 05/30/2024 0635   MCV 98 (H) 11/30/2019 1058   MCV 101 (H) 01/16/2014 0703  MCH 30.8 05/30/2024 0635   MCHC 33.8 05/30/2024 0635   RDW 13.2 05/30/2024 0635   RDW 13.1 11/30/2019 1058   RDW 14.2 01/16/2014 0703   LYMPHSABS 2.7 05/18/2024 1940   LYMPHSABS 2.2 11/30/2019 1058   MONOABS 1.9 (H) 05/18/2024 1940   EOSABS 0.0 05/18/2024 1940   EOSABS 0.0 11/30/2019 1058   BASOSABS 0.0 05/18/2024 1940   BASOSABS 0.0 11/30/2019 1058      Latest Ref Rng & Units 05/30/2024    6:35 AM 05/29/2024   10:26 AM  05/29/2024    4:52 AM  BMP  Glucose 70 - 99 mg/dL 869   897   BUN 6 - 20 mg/dL 8   19   Creatinine 9.38 - 1.24 mg/dL 9.27  9.12  8.97   Sodium 135 - 145 mmol/L 127   128   Potassium 3.5 - 5.1 mmol/L 4.0   4.5   Chloride 98 - 111 mmol/L 95   91   CO2 22 - 32 mmol/L 22   24   Calcium 8.9 - 10.3 mg/dL 9.1   9.9     MR Brain W and Wo Contrast Result Date: 05/29/2024 EXAM: MRI BRAIN WITH AND WITHOUT CONTRAST 05/29/2024 07:22:47 AM TECHNIQUE: Multiplanar multisequence MRI of the head/brain was performed with and without the administration of 7 mL of gadobutrol  (GADAVIST ) 1 MMOL/ML injection. COMPARISON: MRI of the head dated 05/10/2024. 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 FINDINGS: LIMITATIONS/ARTIFACTS: This study is limited by patient motion. BRAIN AND VENTRICLES: No acute infarct. No acute intracranial hemorrhage. No mass effect or midline shift. No hydrocephalus. The sella is unremarkable. Normal flow voids. A round peripherally enhancing mass is again demonstrated in the right posterior frontal lobe, again measuring approximately 18 mm in diameter. There is moderate surrounding vasogenic edema. A round enhancing mass is also again demonstrated at the left parietooccipital junction, again measuring 11 to 12 mm in diameter, similarly unchanged. A small subcentimeter lesion is again demonstrated within the left cerebellar hemisphere, best visualized on coronal image 12 of series 19. The punctate lesion previously seen medially within the left frontal lobe is not convincingly demonstrated on the current exam, but the axial images are significantly limited. There are no new lesions evident. ORBITS: No acute abnormality. SINUSES: No acute abnormality. BONES AND SOFT TISSUES: Normal bone marrow signal and enhancement. No acute soft tissue abnormality. IMPRESSION: 1. Stable round peripherally enhancing  masses in the right posterior frontal lobe (18 mm) and left parietooccipital junction (11-12 mm) with moderate surrounding vasogenic edema. 2. Small subcentimeter lesion in the left cerebellar hemisphere. 3. No new lesions evident. Evaluation limited by patient motion. Electronically signed by: Evalene Coho MD 05/29/2024 08:07 AM EST RP Workstation: [id number]    Disposition Plan & Communication  Patient status: Observation  Admitted From: Home Planned disposition location: TBD Anticipated discharge date: TBD pending clinical stability   Family Communication: several family members at bedside     Author: Marien LITTIE Piety, DO [facility] 05/30/2024, 7:30 AM   Available by Epic secure chat 7AM-7PM. If 7PM-7AM, please contact night-coverage.  [facility] contact information found on [email or web address].

## 2024-05-30 NOTE — TOC Initial Note (Signed)
 Transition of Care [facility]) - Initial/Assessment Note    Patient Details  Name: Lucas Yu MRN: [id number] Date of Birth: [birth year]  Transition of Care [facility]) CM/SW Contact:    Dalia GORMAN Fuse, RN Phone Number: 05/30/2024, 1:33 PM  Clinical Narrative:                  TOC met with the patient, his fiance, sister, and brother in law in the room. The patient is from home with his fiance, up until a few days ago he was able to care for himself. Therapy recs are for STR. With some encouragement from his fiance and family he reluctantly agreed to STR. Their only choices are LC and WOM. If those facilities can't offer he would like to go home with max [facility] services. FL2 sent out and bed offers are pending.       Patient Goals and CMS Choice            Expected Discharge Plan and Services                                              Prior Living Arrangements/Services                       Activities of Daily Living   ADL Screening (condition at time of admission) Independently performs ADLs?: No Does the patient have a NEW difficulty with bathing/dressing/toileting/self-feeding that is expected to last >3 days?: Yes (Initiates electronic notice to provider for possible OT consult) Does the patient have a NEW difficulty with getting in/out of bed, walking, or climbing stairs that is expected to last >3 days?: Yes (Initiates electronic notice to provider for possible PT consult) Does the patient have a NEW difficulty with communication that is expected to last >3 days?: No Is the patient deaf or have difficulty hearing?: No Does the patient have difficulty seeing, even when wearing glasses/contacts?: No Does the patient have difficulty concentrating, remembering, or making decisions?: Yes  Permission Sought/Granted                  Emotional Assessment              Admission diagnosis:  Syncope and collapse [R55] Right sided weakness  [R53.1] Uncontrolled pain [R52] Syncope, unspecified syncope type [R55] Right sided numbness [R20.0] Patient Active Problem List   Diagnosis Date Noted   Syncope and collapse 05/29/2024   Status epilepticus (HCC) 05/18/2024   Lung cancer metastatic to brain (HCC) 05/17/2024   Lung mass 05/03/2024   Essential hypertension 05/03/2024   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 [place] [address] Phone: [phone] Fax: [phone]  [facility] [address] [place] [address] Phone: [phone] Fax: [phone]     Social Drivers of Health (SDOH) Social History: SDOH Screenings   Food Insecurity: No Food Insecurity (05/29/2024)  Housing: High Risk (05/29/2024)  Transportation Needs: No Transportation Needs (05/29/2024)  Utilities: Not At Risk (05/29/2024)  Depression (PHQ2-9): Medium Risk (05/12/2024)  Social Connections: Unknown (05/29/2024)  Tobacco Use: Medium Risk (05/29/2024)   SDOH Interventions:     Readmission Risk Interventions     No data to display

## 2024-05-30 NOTE — Plan of Care (Signed)
  Problem: Health Behavior/Discharge Planning: Goal: Ability to manage health-related needs will improve Outcome: Not Progressing   Problem: Coping: Goal: Level of anxiety will decrease Outcome: Not Progressing   Problem: Elimination: Goal: Will not experience complications related to urinary retention Outcome: Not Progressing   Problem: Pain Managment: Goal: General experience of comfort will improve and/or be controlled Outcome: Not Progressing

## 2024-05-31 ENCOUNTER — Inpatient Hospital Stay: Admitting: Dietician

## 2024-05-31 DIAGNOSIS — R55 Syncope and collapse: Secondary | ICD-10-CM | POA: Diagnosis not present

## 2024-05-31 LAB — COMPREHENSIVE METABOLIC PANEL WITH GFR
ALT: 25 U/L (ref 0–44)
AST: 70 U/L — ABNORMAL HIGH (ref 15–41)
Albumin: 3.2 g/dL — ABNORMAL LOW (ref 3.5–5.0)
Alkaline Phosphatase: 143 U/L — ABNORMAL HIGH (ref 38–126)
Anion gap: 14 (ref 5–15)
BUN: 6 mg/dL (ref 6–20)
CO2: 22 mmol/L (ref 22–32)
Calcium: 9.6 mg/dL (ref 8.9–10.3)
Chloride: 92 mmol/L — ABNORMAL LOW (ref 98–111)
Creatinine, Ser: 0.74 mg/dL (ref 0.61–1.24)
GFR, Estimated: >60 mL/min (ref >60)
Glucose, Bld: 101 mg/dL — ABNORMAL HIGH (ref 70–99)
Potassium: 3.7 mmol/L (ref 3.5–5.1)
Sodium: 129 mmol/L — ABNORMAL LOW (ref 135–145)
Total Bilirubin: 0.4 mg/dL (ref 0.0–1.2)
Total Protein: 7.1 g/dL (ref 6.5–8.1)

## 2024-05-31 LAB — CBC
HCT: 32.2 % — ABNORMAL LOW (ref 39.0–52.0)
Hemoglobin: 11.3 g/dL — ABNORMAL LOW (ref 13.0–17.0)
MCH: 31 pg (ref 26.0–34.0)
MCHC: 35.1 g/dL (ref 30.0–36.0)
MCV: 88.5 fL (ref 80.0–100.0)
Platelets: 357 K/uL (ref 150–400)
RBC: 3.64 MIL/uL — ABNORMAL LOW (ref 4.22–5.81)
RDW: 13.1 % (ref 11.5–15.5)
WBC: 10.1 K/uL (ref 4.0–10.5)
nRBC: 0 % (ref 0.0–0.2)

## 2024-05-31 LAB — LEVETIRACETAM LEVEL: Levetiracetam Lvl: 16 ug/mL (ref 10.0–40.0)

## 2024-05-31 MED ORDER — LORAZEPAM 1 MG PO TABS: 1.0000 mg | ORAL_TABLET | ORAL | Status: DC | PRN

## 2024-05-31 MED ORDER — LORAZEPAM 2 MG/ML IJ SOLN
1.0000 mg | Freq: Once | INTRAMUSCULAR | Status: AC
  Administered 2024-05-31: 1 mg via INTRAVENOUS

## 2024-05-31 MED ORDER — PHENYTOIN SODIUM 50 MG/ML IJ SOLN
100.0000 mg | Freq: Three times a day (TID) | INTRAMUSCULAR | Status: DC
  Administered 2024-05-31 – 2024-06-01 (×5): 100 mg via INTRAVENOUS
  Filled 2024-05-31 (×6): qty 2

## 2024-05-31 MED ORDER — BISACODYL 5 MG PO TBEC
10.0000 mg | DELAYED_RELEASE_TABLET | Freq: Two times a day (BID) | ORAL | Status: DC
  Administered 2024-05-31: 10 mg via ORAL
  Filled 2024-05-31: qty 2

## 2024-05-31 MED ORDER — PANTOPRAZOLE SODIUM 40 MG IV SOLR
40.0000 mg | Freq: Every day | INTRAVENOUS | Status: DC
  Administered 2024-06-01: 40 mg via INTRAVENOUS
  Filled 2024-05-31: qty 10

## 2024-05-31 MED ORDER — SENNA 8.6 MG PO TABS: 1.0000 | ORAL_TABLET | Freq: Two times a day (BID) | ORAL | Status: DC | PRN

## 2024-05-31 MED ORDER — LORAZEPAM 2 MG/ML IJ SOLN
INTRAMUSCULAR | Status: AC
  Filled 2024-05-31: qty 1

## 2024-05-31 MED ORDER — HYDROMORPHONE HCL 1 MG/ML IJ SOLN: 1.0000 mg | INTRAMUSCULAR | Status: DC | PRN

## 2024-05-31 MED ORDER — POLYETHYLENE GLYCOL 3350 17 G PO PACK: 17.0000 g | PACK | Freq: Every day | ORAL | Status: DC | PRN

## 2024-05-31 MED ORDER — SODIUM CHLORIDE 0.9 % IV SOLN: INTRAVENOUS | Status: DC

## 2024-05-31 MED ORDER — MORPHINE 100MG IN NS 100ML (1MG/ML) PREMIX INFUSION
1.0000 mg/h | INTRAVENOUS | Status: DC
  Filled 2024-05-31: qty 100

## 2024-05-31 MED ORDER — OXYCODONE HCL 5 MG PO TABS: 10.0000 mg | ORAL_TABLET | ORAL | Status: DC | PRN

## 2024-05-31 MED ORDER — HYDROMORPHONE HCL 1 MG/ML IJ SOLN
1.0000 mg | INTRAMUSCULAR | Status: DC | PRN
  Administered 2024-05-31 – 2024-06-01 (×6): 1 mg via INTRAVENOUS
  Filled 2024-05-31 (×6): qty 1

## 2024-05-31 MED ORDER — OXYCODONE HCL 5 MG PO TABS: 5.0000 mg | ORAL_TABLET | ORAL | Status: DC | PRN

## 2024-05-31 MED ORDER — LEVETIRACETAM (KEPPRA) 500 MG/5 ML ADULT IV PUSH
1500.0000 mg | Freq: Two times a day (BID) | INTRAVENOUS | Status: DC
  Administered 2024-05-31 – 2024-06-01 (×4): 1500 mg via INTRAVENOUS
  Filled 2024-05-31 (×4): qty 15

## 2024-05-31 MED ORDER — LORAZEPAM 2 MG/ML IJ SOLN
1.0000 mg | INTRAMUSCULAR | Status: DC | PRN
  Administered 2024-05-31 – 2024-06-01 (×3): 1 mg via INTRAVENOUS
  Filled 2024-05-31 (×4): qty 1

## 2024-05-31 MED ORDER — OXYCODONE HCL ER 10 MG PO T12A
10.0000 mg | EXTENDED_RELEASE_TABLET | Freq: Three times a day (TID) | ORAL | Status: DC
  Administered 2024-05-31 – 2024-06-01 (×2): 10 mg via ORAL
  Filled 2024-05-31 (×2): qty 1

## 2024-05-31 NOTE — Progress Notes (Signed)
 Tele order expired, per Dr Lenon, dc tele monitoring

## 2024-05-31 NOTE — Plan of Care (Signed)
   Problem: Health Behavior/Discharge Planning: Goal: Ability to manage health-related needs will improve Outcome: Progressing   Problem: Clinical Measurements: Goal: Ability to maintain clinical measurements within normal limits will improve Outcome: Progressing Goal: Will remain free from infection Outcome: Progressing   Problem: Activity: Goal: Risk for activity intolerance will decrease Outcome: Progressing

## 2024-05-31 NOTE — Progress Notes (Addendum)
 PROGRESS NOTE Lucas Yu    DOB: 1965-11-19, 58 y.o.  FMW:[id number]    Code Status: Limited: Do not attempt resuscitation (DNR) -DNR-LIMITED -Do Not Intubate/DNI    DOA: 05/29/2024   LOS: 1  Brief hospital course  DEQUAN Yu is a 58 y.o. male with a PMH significant for metastatic lung cancer with mets to the brain currently on active radiation therapy, HTN.  They presented from home to the ED on 05/29/2024 with a five minute unresponsive episode.  In the ED, it was found that they had stable vital signs and were asymptomatic.  Significant findings included mostly unremarkable workup but had several more episodes of unresponsiveness which were witnessed by staff. EEG did not show seizure activity throughout the observation period.  Neurology was consulted and recommended increasing lyrica . Oncology has recommended outpatient follow up for treatment options.  Palliative has been consulted given poor prognosis and significant pain with difficulty managing.    05/31/24 -He has not had repeat episodes this morning.  Assessment & Plan  Principal Problem:   Syncope and collapse Active Problems:   Essential hypertension   Lung cancer metastatic to brain (HCC)  Blank stare episodes/syncopal episode- negative post-ictal state. Consistent with absence seizure on observation yesterday. Has been adherent with his seizure ppx. MRI of the brain is stable with right posterior frontal lobe, left parieto-occipital junction and left cerebellar lesions.  - neurology is consulted  - see note for IV alternate treatment recommendations if unable to tolerate PO - EEG did not show active seizure activity - continue keppra  and dilantin  - continue increased dose lyrica  TID - supportive care. Continue steroids - ativan  PRN - seizure precautions UPDATE: patient was unable to tolerate PO meds this am and subsequently had seizure like activity. Converted to IV as recommended by neuro.   Nausea, vomiting-  several episodes of emesis yesterday. Related to pain? Seems to have resolved today but still poor appetite.  - antiemetics PRN  Hyponatremia- Na+ 127>129. Resulting from vomiting, cerebral edema? Could be contributing to hyperexcitability  - continue IVF   Uncontrolled pain- right side of body. Unable to further describe - continued his gabapentin , dexamethasone  from home, oxycodone , Tylenol . - Continue to monitor pain and try to control his pain as much as possible -palliative consulted and adjusting pain medications. Appreciate your care. Family and patient expressed high priority for pain control   HTN - Continue his home medications    Lung cancer with brain metastasis - Patient is under radiation therapy. - oncology consulted on admission- recommends outpatient follow up to discuss ongoing treatment options.  - Palliative also consulted, appreciate your care - placed on O2 for comfort, no documented desats   Deconditioning PT/OT  Body mass index is 26.16 kg/m.  VTE ppx: enoxaparin  (LOVENOX ) injection 40 mg Start: 05/29/24 2200 SCDs Start: 05/29/24 1026  Diet:     Diet   Diet regular Room service appropriate? Yes; Fluid consistency: Thin   Consultants: Neurology  Oncology  Palliative   Subjective 05/31/24    Pt reports continued R side pain. Calls out for his mom. Otherwise falling asleep during encounter and spoke with his fiance at bedside.    Objective  Blood pressure 127/82, pulse 73, temperature (!) 96.5 F (35.8 C), temperature source Oral, resp. rate 18, height 5' 7 (1.702 m), weight 75.8 kg, SpO2 100%.  Intake/Output Summary (Last 24 hours) at 05/31/2024 0720 Last data filed at 05/31/2024 0557 Gross per 24 hour  Intake 851.77 ml  Output 250 ml  Net 601.77 ml   Filed Weights   05/29/24 0445  Weight: 75.8 kg    Physical Exam:  General: awake, alert. Easily falling asleep mid conversation.  HEENT: atraumatic, clear conjunctiva, anicteric sclera,  MMM, hearing grossly normal Respiratory: normal respiratory effort. CTAB Cardiovascular: extremities well perfused Nervous: Alert and answers questions appropriately when awake. Moving all extremities. No witnessed episodes today. Extremities: moves all equally, no edema, normal tone Skin: dry, intact, normal temperature, normal color. No rashes, lesions or ulcers on exposed skin Psychiatry: tearful, anxious  Labs   I have personally reviewed the following labs and imaging studies CBC    Component Value Date/Time   WBC 10.1 05/31/2024 0601   RBC 3.64 (L) 05/31/2024 0601   HGB 11.3 (L) 05/31/2024 0601   HGB 12.9 (L) 05/12/2024 1324   HGB 14.9 11/30/2019 1058   HCT 32.2 (L) 05/31/2024 0601   HCT 44.1 11/30/2019 1058   PLT 357 05/31/2024 0601   PLT 493 (H) 05/12/2024 1324   PLT 231 11/30/2019 1058   MCV 88.5 05/31/2024 0601   MCV 98 (H) 11/30/2019 1058   MCV 101 (H) 01/16/2014 0703   MCH 31.0 05/31/2024 0601   MCHC 35.1 05/31/2024 0601   RDW 13.1 05/31/2024 0601   RDW 13.1 11/30/2019 1058   RDW 14.2 01/16/2014 0703   LYMPHSABS 2.7 05/18/2024 1940   LYMPHSABS 2.2 11/30/2019 1058   MONOABS 1.9 (H) 05/18/2024 1940   EOSABS 0.0 05/18/2024 1940   EOSABS 0.0 11/30/2019 1058   BASOSABS 0.0 05/18/2024 1940   BASOSABS 0.0 11/30/2019 1058      Latest Ref Rng & Units 05/31/2024    6:01 AM 05/30/2024    6:35 AM 05/29/2024   10:26 AM  BMP  Glucose 70 - 99 mg/dL 898  869    BUN 6 - 20 mg/dL 6  8    Creatinine 9.38 - 1.24 mg/dL 9.25  9.27  9.12   Sodium 135 - 145 mmol/L 129  127    Potassium 3.5 - 5.1 mmol/L 3.7  4.0    Chloride 98 - 111 mmol/L 92  95    CO2 22 - 32 mmol/L 22  22    Calcium 8.9 - 10.3 mg/dL 9.6  9.1     EEG adult Result Date: 05/30/2024 Shelton Arlin KIDD, MD     05/30/2024  8:05 PM Patient Name: Lucas Yu MRN: [id number] Epilepsy Attending: Arlin KIDD Shelton Referring Physician/Provider: Germaine Raring, MD Date: 05/30/2024 Duration: 26.38 mins Patient history: 58  y.o. male with medical history significant for metastatic lung cancer with mets to the brain currently on active radiation therapy, seizures, HTN noted to have multiple staring spells. EEG to evaluate for seizure Level of alertness: Awake AEDs during EEG study: LEV, PHT, PGB Technical aspects: This EEG study was done with scalp electrodes positioned according to the 10-20 International system of electrode placement. Electrical activity was reviewed with band pass filter of 1-70Hz , sensitivity of 7 uV/mm, display speed of 69mm/sec with a 60Hz  notched filter applied as appropriate. EEG data were recorded continuously and digitally stored.  Video monitoring was available and reviewed as appropriate. Description: The posterior dominant rhythm consists of 8 Hz activity of moderate voltage (25-35 uV) seen predominantly in posterior head regions, symmetric and reactive to eye opening and eye closing. EEG showed intermittent generalized 3 to 6 Hz theta-delta slowing. Hyperventilation and photic stimulation were not performed.   During the study at 1515, patient reported  having a seizure. Concomitant EEG before, during and after the event showed normal posterior dominant rhythm and did not show any EEG changes  to suggest seizure.  ABNORMALITY - Intermittent slow, generalized  IMPRESSION: This study is suggestive of generalized cerebral dysfunction ( encephalopathy). No seizures or epileptiform discharges were seen throughout the recording. During the study at 1515, patient reported having a seizure without concomitant EEG change. This was mot likely not an epileptic event.  Arlin MALVA Krebs   MR Brain W and Wo Contrast Result Date: 05/29/2024 EXAM: MRI BRAIN WITH AND WITHOUT CONTRAST 05/29/2024 07:22:47 AM TECHNIQUE: Multiplanar multisequence MRI of the head/brain was performed with and without the administration of 7 mL of gadobutrol  (GADAVIST ) 1 MMOL/ML injection. COMPARISON: MRI of the head dated 05/10/2024. CLINICAL  HISTORY: Right-sided numbness and weakness for 4 days, history of metastatic lung cancer with mets to the brain getting brain radiation. FINDINGS: LIMITATIONS/ARTIFACTS: This study is limited by patient motion. BRAIN AND VENTRICLES: No acute infarct. No acute intracranial hemorrhage. No mass effect or midline shift. No hydrocephalus. The sella is unremarkable. Normal flow voids. A round peripherally enhancing mass is again demonstrated in the right posterior frontal lobe, again measuring approximately 18 mm in diameter. There is moderate surrounding vasogenic edema. A round enhancing mass is also again demonstrated at the left parietooccipital junction, again measuring 11 to 12 mm in diameter, similarly unchanged. A small subcentimeter lesion is again demonstrated within the left cerebellar hemisphere, best visualized on coronal image 12 of series 19. The punctate lesion previously seen medially within the left frontal lobe is not convincingly demonstrated on the current exam, but the axial images are significantly limited. There are no new lesions evident. ORBITS: No acute abnormality. SINUSES: No acute abnormality. BONES AND SOFT TISSUES: Normal bone marrow signal and enhancement. No acute soft tissue abnormality. IMPRESSION: 1. Stable round peripherally enhancing masses in the right posterior frontal lobe (18 mm) and left parietooccipital junction (11-12 mm) with moderate surrounding vasogenic edema. 2. Small subcentimeter lesion in the left cerebellar hemisphere. 3. No new lesions evident. Evaluation limited by patient motion. Electronically signed by: Evalene Coho MD 05/29/2024 08:07 AM EST RP Workstation: [id number]   Disposition Plan & Communication  Patient status: Inpatient  Admitted From: Home Planned disposition location: TBD Anticipated discharge date: TBD pending clinical stability   Family Communication: several family members at bedside     Author: Marien LITTIE Piety, DO [facility] 05/31/2024, 7:20 AM   Available by Epic secure chat 7AM-7PM. If 7PM-7AM, please contact night-coverage.  [facility] contact information found on [email or web address].

## 2024-05-31 NOTE — Plan of Care (Deleted)

## 2024-05-31 NOTE — Progress Notes (Signed)
 This morning I went in to give patient his medications and patient was very tired and barley staying awake. I tried to feed him a bote of ice cream and noticed he was pocketing so I used the suctions to make sure that everything was out of his mouth. I messaged MD at [phone] and let her know that patient was unable to take his oral medications due to pocketing and needing suction and she acknowledged my message. At 1055 family came out of the room stating patient was having a seizure. MD was on the phone with Camellia Edis NP at the time and was made aware of the seizure activity. Ativan  was ordered and given and seizure activity stopped at 1100. Keppra  was given at 1116 and Phenytoin  given at 1127. Family requested to speak with the MD and she was notified. Patient was left on 3L Nasal cannula, call bell within reach family at bedside.

## 2024-05-31 NOTE — Progress Notes (Signed)
 Was called into patient room due to him saying he had to void. Patient was able to void 50ml. Patient was bladder scanned and got . MD made aware.

## 2024-05-31 NOTE — Progress Notes (Signed)
 Subjective: Complains of severe pain today.  Continues to have nausea and vomiting as well but appears to have been keeping down medications.  Significant other reports that there has been a decrease in the staring spells.    Objective: Current vital signs: BP (!) 151/91 (BP Location: Right Arm)   Pulse (!) 102   Temp 98.9 F (37.2 C) (Oral)   Resp 18   Ht 5' 7 (1.702 m)   Wt 75.8 kg   SpO2 98%   BMI 26.16 kg/m  Vital signs in last 24 hours: Temp:  [98.2 F (36.8 C)-98.9 F (37.2 C)] 98.9 F (37.2 C) (12/09 0726) Pulse Rate:  [98-107] 102 (12/09 0726) Resp:  [18-20] 18 (12/09 0726) BP: (125-153)/(87-96) 151/91 (12/09 0726) SpO2:  [98 %-100 %] 98 % (12/09 0726)  Intake/Output from previous day: 12/08 0701 - 12/09 0700 In: 851.8 [I.V.:801.8; IV Piggyback:50] Out: 250 [Urine:250] Intake/Output this shift: No intake/output data recorded. Nutritional status:  Diet Order             Diet regular Room service appropriate? Yes; Fluid consistency: Thin  Diet effective now                   Neurologic Exam: Mental Status: Alert, oriented.  Speech fluent without evidence of aphasia.  Able to follow commands.  Patient uncomfortable and having difficulty cooperating with examination.   Cranial Nerves: III,IV, VI: ptosis not present, extra-ocular motions intact bilaterally V,VII: Left facial droop, decreased sensation on left side of face VIII: hearing normal bilaterally XI: bilateral shoulder shrug XII: midline tongue extension Motor: Weakly lifts all extremities against gravity Sensory: Pinprick and light touch decreased in the LUE  Lab Results: Basic Metabolic Panel: Recent Labs  Lab 05/27/24 0737 05/29/24 0452 05/29/24 1026 05/30/24 0635 05/31/24 0601  NA 125* 128*  --  127* 129*  K 4.6 4.5  --  4.0 3.7  CL 90* 91*  --  95* 92*  CO2 22 24  --  22 22  GLUCOSE 111* 102*  --  130* 101*  BUN 23* 19  --  8 6  CREATININE 1.03 1.02 0.87 0.72 0.74  CALCIUM 9.9  9.9  --  9.1 9.6  MG  --  2.5*  --   --   --     Liver Function Tests: Recent Labs  Lab 05/27/24 0737 05/29/24 0452 05/30/24 0635 05/31/24 0601  AST 53* 69* 48* 70*  ALT 23 29 13 25   ALKPHOS 147* 164* 133* 143*  BILITOT 0.4 0.4 0.4 0.4  PROT 8.3* 8.2* 6.6 7.1  ALBUMIN 3.8 3.7 2.8* 3.2*   No results for input(s): LIPASE, AMYLASE in the last 168 hours. No results for input(s): AMMONIA in the last 168 hours.  CBC: Recent Labs  Lab 05/27/24 0737 05/29/24 0452 05/29/24 1026 05/30/24 0635 05/31/24 0601  WBC 9.1 9.6 8.7 7.8 10.1  HGB 13.6 13.5 12.4* 11.3* 11.3*  HCT 40.1 39.4 36.9* 33.4* 32.2*  MCV 89.1 89.7 90.9 91.0 88.5  PLT 343 352 332 303 357    Cardiac Enzymes: Recent Labs  Lab 05/29/24 0452  CKTOTAL 87    Lipid Panel: No results for input(s): CHOL, TRIG, HDL, CHOLHDL, VLDL, LDLCALC in the last 168 hours.  CBG: Recent Labs  Lab 05/29/24 0444  GLUCAP 96    Microbiology: Results for orders placed or performed during the hospital encounter of 05/23/24  Culture, blood (routine x 2)     Status: None   Collection  Time: 05/23/24 12:11 PM   Specimen: BLOOD LEFT FOREARM  Result Value Ref Range Status   Specimen Description BLOOD LEFT FOREARM  Final   Special Requests   Final    BOTTLES DRAWN AEROBIC AND ANAEROBIC Blood Culture results may not be optimal due to an inadequate volume of blood received in culture bottles   Culture   Final    NO GROWTH 5 DAYS Performed at [facility], [address] [place], [place] [address]    Report Status 05/28/2024 FINAL  Final    Coagulation Studies: Recent Labs    05/30/24 0635  LABPROT 15.0  INR 1.1    Imaging: EEG adult Result Date: 05/30/2024 Shelton Arlin KIDD, MD     05/30/2024  8:05 PM Patient Name: Lucas Yu MRN: [id number] Epilepsy Attending: Arlin KIDD Shelton Referring Physician/Provider: Germaine Raring, MD Date: 05/30/2024 Duration: 26.38 mins Patient history: 58 y.o. male  with medical history significant for metastatic lung cancer with mets to the brain currently on active radiation therapy, seizures, HTN noted to have multiple staring spells. EEG to evaluate for seizure Level of alertness: Awake AEDs during EEG study: LEV, PHT, PGB Technical aspects: This EEG study was done with scalp electrodes positioned according to the 10-20 International system of electrode placement. Electrical activity was reviewed with band pass filter of 1-70Hz , sensitivity of 7 uV/mm, display speed of 69mm/sec with a 60Hz  notched filter applied as appropriate. EEG data were recorded continuously and digitally stored.  Video monitoring was available and reviewed as appropriate. Description: The posterior dominant rhythm consists of 8 Hz activity of moderate voltage (25-35 uV) seen predominantly in posterior head regions, symmetric and reactive to eye opening and eye closing. EEG showed intermittent generalized 3 to 6 Hz theta-delta slowing. Hyperventilation and photic stimulation were not performed.   During the study at 1515, patient reported having a seizure. Concomitant EEG before, during and after the event showed normal posterior dominant rhythm and did not show any EEG changes  to suggest seizure.  ABNORMALITY - Intermittent slow, generalized  IMPRESSION: This study is suggestive of generalized cerebral dysfunction ( encephalopathy). No seizures or epileptiform discharges were seen throughout the recording. During the study at 1515, patient reported having a seizure without concomitant EEG change. This was mot likely not an epileptic event.  Priyanka O Yadav    Medications: I have reviewed the patient's current medications. Scheduled:  cyanocobalamin   1,000 mcg Oral Daily   enoxaparin  (LOVENOX ) injection  40 mg Subcutaneous Q24H   feeding supplement  237 mL Oral BID BM   levETIRAcetam   1,500 mg Oral BID   loratadine   10 mg Oral Daily   nicotine   7 mg Transdermal Daily   pantoprazole   40 mg  Oral Daily   phenytoin   150 mg Oral BID   pregabalin   50 mg Oral Q8H   sucralfate   1 g Oral QID    Assessment/Plan:  58 y.o. male with medical history significant for metastatic lung cancer with mets to the brain currently on active radiation therapy, seizures, HTN noted to have multiple staring spells.  Significant other feels they have decreased some.  MRI of the brain personally reviewed and is stable with right posterior frontal lobe, left parieto-occipital junction and left cerebellar lesions.  Currently on Keppra  and Dilantin .  Lyrica  increased on yesterday.  Appears to be tolerating increase.  Lyrica  not helping with pain.   EEG performed and only significant for slowing.  One episode of staring captured  with no epileptic correlate seen.  Despite this there is still some concern that these events are seizure.   Recommendations: Continue Lyrica , Keppra  and Dilantin  at current doses.   Continue seizure precautions If patient unable to take po would change Keppra  to IV at 1500mg  q 12 hours and Dilantin  to IV at 100mg  IV q 8 hours. Based on seizure response may also need to add Lacosamide at 100mg  q 12 hours since this has an IV preparation and Lyrica  does not    LOS: 1 day   Sonny Hock, MD Neurology  05/31/2024  8:53 AM

## 2024-05-31 NOTE — Progress Notes (Signed)
 PT Cancellation Note  Patient Details Name: KHYE HOCHSTETLER MRN: [id number] DOB: 1966-05-02   Cancelled Treatment:    Reason Eval/Treat Not Completed: Medical issues which prohibited therapy;Fatigue/lethargy limiting ability to participate (Consult received and chart reviewed.  Patient resting soundly, multiple visitors at bedside. Per discussion with RN, will allow patient to rest this date and re-attempt next date as medically appropriate and in alignment with goals of care.)   Bexton Haak H. Delores, PT, DPT, NCS 05/31/24, 6:22 PM [phone]

## 2024-05-31 NOTE — TOC Progression Note (Signed)
 Transition of Care [facility]) - Progression Note    Patient Details  Name: Lucas Yu MRN: [id number] Date of Birth: [birth year]  Transition of Care [facility]) CM/SW Contact  Dalia GORMAN Fuse, RN Phone Number: 05/31/2024, 2:48 PM  Clinical Narrative:     Patient with no bed offers. Bed search extended.  [facility] Preferred SNF  Declined Payer/insurance denied or not accepted -- [address], [place] [address] [phone] --  [facility] [place] [facility] meet patient's needs -- [address], [place] [address] [phone] --  [facility] SNF  Declined No bed availablity -- [address] [place] [address] [phone] --  [facility] Preferred SNF  Declined -- [address], [place] [address] [phone] --  [facility]  Declined Out of network -- [address], [place] [address] [phone] --  [facility] Preferred SNF  Declined History of violence and/or drug or alcohol abuse -- [address] [place] [address] [phone]                       Expected Discharge Plan and Services                                               Social Drivers of Health (SDOH) Interventions SDOH Screenings   Food Insecurity: No Food Insecurity (05/29/2024)  Housing: High Risk (05/29/2024)  Transportation Needs: No Transportation Needs (05/29/2024)  Utilities: Not At Risk (05/29/2024)  Depression (PHQ2-9): Medium Risk (05/12/2024)  Social Connections: Unknown (05/29/2024)  Tobacco Use: Medium Risk (05/29/2024)    Readmission Risk Interventions     No data to display

## 2024-05-31 NOTE — Progress Notes (Signed)
 Daily Progress Note   Date: 05/31/2024   Patient Name: Lucas Yu  DOB: Aug 07, 1965  MRN: [id number]  Age / Sex: 58 y.o., male  Attending Physician: Lenon Marien CROME, MD Primary Care Physician: [facility] Admit Date: 05/29/2024 Length of Stay: 1 day  Reason for Follow-up: Establishing goals of care  Past Medical History:  Diagnosis Date   Brain tumor (benign) (HCC)    Hypertension    Lung cancer (HCC)     Subjective:   Subjective: Chart Reviewed. Updates received. Patient Assessed. Created space and opportunity for patient  and family to explore thoughts and feelings regarding current medical situation.  Today's Discussion: Today before meeting with the patient/family, I reviewed the chart notes including OT note from yesterday, neurology note from yesterday, epileptologist note from yesterday, nurse note from today, family medicine note from today, neurology note from today.. I also reviewed vital signs, nursing flowsheets, medication administrations record, labs, and imaging. Labs reviewed include CMP which shows hyponatremia at 129 in the setting of brain metastasis and surrounding vasogenic edema.  CBC shows bump in white count to 10.1 although remains normal in the setting of metastatic cancer new difficulty swallowing.  Today prior to seeing the patient I reach out to the hospitalist Dr. Lenon to discuss offer for palliative medicine to assist in pain management which he accepted.  I talked about transitioning previous 24-hour medications to oral long-acting OxyContin  with IR oxycodone  and IV Dilaudid  available for breakthrough pain.  I communicated this to the nurse who expressed concern about the patient's ability to handle pills.  I shared that is a new development for today and I would be coming to the bedside to evaluate the patient.    Today I saw the patient bedside, her fianc was also present.  For part of our visit she FaceTime to the patient's  daughter as well.  Today the patient looks worse than yesterday.  He is less responsive, decline in mental status.  She shares there is been a decline and the staring episodes.  Nausea appears better today, as he is not actively heaving.  He does admit that he is in pain and I shared the role of pain management and how it is approaching various populations.  It is part of the conversation the patient's daughter was on the call.  I shared that persistent pain medicine drips are generally reserved for patients who are at end-of-life and comfort care.  However, because the patient wants to continue full scope of treatment including cancer treatment we will try to manage the pain and keep it tolerable but also considering mental status/awareness and functional status.  Everybody understands.  However, the patient is unable to take pills today so we would need to continue with primarily IV medications.  I shared and made it very clear that I am concerned about the patient's significant change over the past 24 hours including increasing mental status changes/decline and difficulty swallowing/unable to take pills.  I shared that it is possible that his overall picture is deteriorating rapidly and we may have to have more difficult conversations in the coming days.  The patient's daughter asked to speak to her father via FaceTime and she encouraged him to continue fighting and shared that others are fighting for him as well.  Everyone present understands that we are in a difficult situation and if the medical team is concerned about overall prognosis.  At this time, we will allow more time for outcomes  and try to manage his pain with IV medications, concomitant seizures with IV medications.  I left the room to discuss with the nurse and at that point another staff member called out that the patient is having a seizure.  I went to the room and the patient was having an apparent seizure with facial twitching, difficulty  responding, not following commands.  I put in a one-time order for IV push Ativan  to help break the seizure.  I called the hospitalist to notify her of the change and she shares that she will change AEDs to IV preparation.  With the IV benzodiazepine the seizure broke after several minutes.  The patient's fianc is understandably very tearful.  She states that the patient's mother is en route and the nurse shares that the hospitalist has been asked to be notified when the patient's mother arrives in order to come to the bedside to speak with them.  I shared that I would be back tomorrow and follow-up on ongoing goals of care conversations pending how he does in the next 24 hours.  Family understands we may have to have difficult conversations in the next 24 to 48 hours, pending how he does. I provided emotional and general support through therapeutic listening, empathy, sharing of stories, and other techniques. I answered all questions and addressed all concerns to the best of my ability.  ADDENDUM: Later today I returned to the bedside to check with the patient, he appears to be resting.  Other family members are present and I elected to not disturb them.  The nurse states that there have been no further seizures.  She shares that the patient's mother and sister are present and are surprised by his change, seem to understand that things are getting worse.  They have requested time to visit and discuss.  I shared with the nurse that I would be back tomorrow to have ongoing GOC conversations pending how he does in the next 12 to 24 hours.  ROS limited due to clinical situation Review of Systems  Constitutional:        Pain all over    Objective:   Primary Diagnoses: Present on Admission:  Syncope and collapse  Essential hypertension  Lung cancer metastatic to brain (HCC)   Vital Signs:  BP (!) 151/91 (BP Location: Right Arm)   Pulse (!) 102   Temp 98.9 F (37.2 C) (Oral)   Resp 18   Ht 5'  7 (1.702 m)   Wt 75.8 kg   SpO2 98%   BMI 26.16 kg/m   Physical Exam Vitals and nursing note reviewed.  Constitutional:      Comments: Intermittently awake  HENT:     Head: Normocephalic and atraumatic.  Cardiovascular:     Rate and Rhythm: Normal rate.  Pulmonary:     Effort: Pulmonary effort is normal. No respiratory distress.  Abdominal:     General: Abdomen is flat.     Palpations: Abdomen is soft.  Skin:    General: Skin is warm and dry.  Neurological:     Mental Status: He is disoriented and confused.     Comments: Noted seizure as per note above  Psychiatric:        Mood and Affect: Mood normal.        Behavior: Behavior normal.     Palliative Assessment/Data: 10%   Existing Vynca/ACP Documentation: Advance directive signed 05/09/2024 Goals of care document signed 05/12/2024  Assessment & Plan:   HPI/Patient Profile:  58 y.o. male  with past medical history of metastatic lung cancer with mets to the brain currently on active radiation therapy, HTN.  He presented from home to the ED on 05/29/2024 with a five minute unresponsive episode. He was admitted on 05/29/2024 with blank stare/syncopal episode, uncontrolled pain, lung cancer with brain metastasis, and others.    Palliative medicine was consulted for GOC conversations.  SUMMARY OF RECOMMENDATIONS   DNR-Limited (DNR/DNI) Full scope of care otherwise Ordered Ativan  1 mg IV for acute onset seizure See other symptom management orders below Time for outcomes and evaluation/answers Palliative medicine will continue to follow  Symptom Management:  Change Dilaudid  to 1 mg IV every 3 hours as needed pain Changed Ativan  to 1 mg IV every 4 hours as needed anxiety or seizure OxyContin  10 mg every 8 hours (WHEN/IF ABLE TO TAKE PILLS) Oxycodone  10 mg every 4 hours as needed (WHEN/IF ABLE TO TAKE PILLS)  Code Status: DNR - Limited (DNR/DNI)  Prognosis: Unable to determine  Discharge Planning: To Be  Determined  Discussed with: Patient, family, medical team, nursing team  Thank you for allowing us  to participate in the care of Lucas Yu PMT will continue to support holistically.  Time Total: 120 min  Detailed review of medical records (labs, imaging, vital signs), medically appropriate exam, discussed with treatment team, counseling and education to patient, family, & staff, documenting clinical information, medication management, coordination of care  Camellia Kays, NP Palliative Medicine Team  Team Phone # [phone] (Nights/Weekends)  02/19/2021, 8:17 AM

## 2024-06-01 DIAGNOSIS — R627 Adult failure to thrive: Secondary | ICD-10-CM | POA: Insufficient documentation

## 2024-06-01 DIAGNOSIS — E43 Unspecified severe protein-calorie malnutrition: Secondary | ICD-10-CM | POA: Insufficient documentation

## 2024-06-01 DIAGNOSIS — R569 Unspecified convulsions: Secondary | ICD-10-CM | POA: Diagnosis not present

## 2024-06-01 DIAGNOSIS — Z515 Encounter for palliative care: Secondary | ICD-10-CM | POA: Diagnosis not present

## 2024-06-01 DIAGNOSIS — R55 Syncope and collapse: Secondary | ICD-10-CM | POA: Diagnosis not present

## 2024-06-01 DIAGNOSIS — C7931 Secondary malignant neoplasm of brain: Secondary | ICD-10-CM | POA: Diagnosis not present

## 2024-06-01 DIAGNOSIS — R531 Weakness: Secondary | ICD-10-CM | POA: Diagnosis not present

## 2024-06-01 DIAGNOSIS — Z7189 Other specified counseling: Secondary | ICD-10-CM | POA: Diagnosis not present

## 2024-06-01 MED ORDER — GLYCOPYRROLATE 0.2 MG/ML IJ SOLN
0.2000 mg | INTRAMUSCULAR | Status: DC | PRN
  Administered 2024-06-01: 0.2 mg via INTRAVENOUS
  Filled 2024-06-01: qty 1

## 2024-06-01 MED ORDER — HYDROMORPHONE HCL 1 MG/ML IJ SOLN
0.5000 mg | INTRAMUSCULAR | Status: DC | PRN
  Administered 2024-06-01: 1 mg via INTRAVENOUS
  Administered 2024-06-01 (×3): 2 mg via INTRAVENOUS
  Administered 2024-06-01: 1 mg via INTRAVENOUS
  Administered 2024-06-01: 2 mg via INTRAVENOUS
  Administered 2024-06-01: 1 mg via INTRAVENOUS
  Filled 2024-06-01 (×2): qty 2
  Filled 2024-06-01 (×3): qty 1
  Filled 2024-06-01 (×2): qty 2

## 2024-06-01 MED ORDER — BIOTENE DRY MOUTH MT LIQD: 15.0000 mL | OROMUCOSAL | Status: DC | PRN

## 2024-06-01 MED ORDER — GLYCOPYRROLATE 1 MG PO TABS: 1.0000 mg | ORAL_TABLET | ORAL | Status: DC | PRN

## 2024-06-01 MED ORDER — HALOPERIDOL 0.5 MG PO TABS: 0.5000 mg | ORAL_TABLET | ORAL | Status: DC | PRN

## 2024-06-01 MED ORDER — HALOPERIDOL LACTATE 5 MG/ML IJ SOLN
0.5000 mg | INTRAMUSCULAR | Status: DC | PRN
  Administered 2024-06-01: 0.5 mg via INTRAVENOUS
  Filled 2024-06-01: qty 1

## 2024-06-01 MED ORDER — ENSURE PLUS HIGH PROTEIN PO LIQD: 237.0000 mL | Freq: Two times a day (BID) | ORAL | Status: DC

## 2024-06-01 MED ORDER — HALOPERIDOL LACTATE 2 MG/ML PO CONC: 0.5000 mg | ORAL | Status: DC | PRN

## 2024-06-01 MED ORDER — POLYVINYL ALCOHOL 1.4 % OP SOLN: 1.0000 [drp] | Freq: Four times a day (QID) | OPHTHALMIC | Status: DC | PRN

## 2024-06-01 MED ORDER — GLYCOPYRROLATE 0.2 MG/ML IJ SOLN: 0.2000 mg | INTRAMUSCULAR | Status: DC | PRN

## 2024-06-01 NOTE — Progress Notes (Signed)
 PT Cancellation Note  Patient Details Name: Lucas Yu MRN: [id number] DOB: [birth year]   Cancelled Treatment:    Reason Eval/Treat Not Completed: Other (comment) Pt. On comfort care. PT orders discontinued    Sherlean DELENA [name] 06/01/2024, 11:24 AM

## 2024-06-01 NOTE — Progress Notes (Signed)
 OT Cancellation Note  Patient Details Name: Lucas Yu MRN: [id number] DOB: March 02, 1966   Cancelled Treatment:    Reason Eval/Treat Not Completed: Other (comment) (OT order discontinued, patient going comfort care)  [name] Clause 06/01/2024, 11:20 AM

## 2024-06-01 NOTE — Progress Notes (Signed)
°  Progress Note   Patient: Lucas Yu FMW:[id number] DOB: January 04, 1966 DOA: 05/29/2024     2 DOS: the patient was seen and examined on 06/01/2024   Brief hospital course: Lucas Yu is a 58 y.o. male with a PMH significant for metastatic lung cancer with mets to the brain, HTN, presented from home to the ED on 05/29/2024 with a five minute unresponsive episode.  While in the hospital, patient had a seizure episode. Discussed with the family and oncology, patient condition has been deteriorating, has not been eating for a whole week.  Currently has no appetite.  He also very confused, discussed with oncology, no additional treatment.  As a result, patient to transition to comfort care.  Family and patient wish for him to die in the hospital.   Principal Problem:   Syncope and collapse Active Problems:   Essential hypertension   Lung cancer metastatic to brain [facility])   Assessment and Plan: Lung cancer metastatic to the brain. 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 Delirium secondary to brain mets Failure to thrive secondary to terminal cancer. Discussed with oncology, patient is not a candidate for any additional treatment.  He has not been eating for a whole week, he is very agitated and confused.  It appears that the patient is actively dying.  Will transition to comfort care.  Patient family wish to keep patient in the hospital for the final few days.  Anticipating death in 2 to 3 days.  Hyponatremia. No additional treatment.  Right sided body pain. Continue pain medicine.  Essential hypertension. No additional treatment.    Subjective:  Patient has not been eating, he is very confused and agitated.  Physical Exam: Vitals:   05/31/24 2021 06/01/24 0020 06/01/24 0415 06/01/24 0743  BP:  (!) 146/94 130/82 117/83  Pulse: (!) 114 (!) 104 (!) 107 (!) 110  Resp:  19 19 20   Temp:  98.4 F (36.9 C) 98.7 F (37.1 C) 99.3 F (37.4 C)  TempSrc:    Oral  SpO2: 98% 100% 99% 97%   Weight:      Height:       General exam: Ill-appearing, uncomfortable. Respiratory system: Clear to auscultation. Respiratory effort normal. Cardiovascular system: S1 & S2 heard, RRR. No JVD, murmurs, rubs, gallops or clicks. No pedal edema. Gastrointestinal system: Abdomen is nondistended, soft and nontender. No organomegaly or masses felt. Normal bowel sounds heard. Central nervous system: Confused and agitated Extremities: Symmetric 5 x 5 power. Skin: No rashes, lesions or ulcers Psychiatry: Flat affect.   Data Reviewed:  Reviewed CT scan results and lab results  Family Communication: Wife and daughter updated at bedside.  Disposition: Status is: Inpatient Remains inpatient appropriate because: Severity of disease, comfort care     Time spent: 50 minutes  Author: Murvin Mana, MD 06/01/2024 1:26 PM  For on call review [email or web address].

## 2024-06-01 NOTE — Progress Notes (Signed)
 Daily Progress Note   Date: 06/01/2024   Patient Name: Lucas Yu  DOB: [birth year]  MRN: [id number]  Age / Sex: 58 y.o., male  Attending Physician: Laurita Pillion, MD Primary Care Physician: [facility] Admit Date: 05/29/2024 Length of Stay: 2 days  Reason for Follow-up: Establishing goals of care  Past Medical History:  Diagnosis Date   Brain tumor (benign) (HCC)    Hypertension    Lung cancer (HCC)     Subjective:   Subjective: Chart Reviewed. Updates received. Patient Assessed. Created space and opportunity for patient  and family to explore thoughts and feelings regarding current medical situation.  Today's Discussion: Today before meeting with the patient/family, I reviewed the chart notes including admission note from yesterday, PT note from yesterday, nurse note from today, TOC note from today.  Later today I reviewed PT and OT note from today and oncology note from today. I also reviewed vital signs, nursing flowsheets, medication administrations record, labs, and imaging.  No labs were completed today.  Today prior to seeing the patient I spoke with bedside nurse.  She indicates patient is still not tolerating oral intake due to somnolence.  Antiepileptics are now IV.  He is continue to complain of pain when he is awake and she has been using the IV Dilaudid  and IV Ativan  as needed.  No recurrent seizures since the seizure yesterday morning antiepileptics are intravenous.  Today I saw the patient bedside, multiple family members were present including his fiance, mother, sister, niece, friend, mother-in-law.  We spent time reviewing the conversation to have with the hospitalist this morning.  The patient's family understands that he has no options left for treatment/care given the rapid decline that he has suffered in the past weeks.  They are understandably very emotional.  We talked about options for care in the absence of options for care.  The patient's  mother shared that she has buried all of her children except the patient.  She talked to him yesterday and he told her that he is tired and does not want to suffer anymore.  Other family members agree.  She shares at this point she just wants him to be comfortable and not suffer.  She spent time sharing other details about their personal history together, her faith in God and how this brings her strengths.  She states that these are not my children, they are God's children.  He gave them to me to care for here and I have sent all of them home except him.  He is ready and I am ready to send him home to God.  Family was understandably emotional during this time.  We spent time talk about options moving forward comfort care and hospice. I explained comfort care as care where the patient would no longer receive aggressive medical interventions such as continuous vital signs, lab work, radiology testing, or medications not focused on comfort, peace, and dignity. This includes stopping antibiotics and weaning oxygen to room air, as these are generally not accepted as providing comfort but only prolonging the dying process artificially. All care would focus on how the patient is looking and feeling. This would include management of any symptoms that may cause discomfort, pain, shortness of breath/air hunger, increased work of breathing, cough, nausea, agitation/restlessness, anxiety, and/or secretions etc. Symptoms would be managed with medications and other non-pharmacological interventions such as spiritual support if requested, repositioning, music therapy, or therapeutic listening. Family verbalized understanding and agreement.  We discussed hospice as an option for comfort care outside the hospital. I described hospice as a service for patients who have a life expectancy of 6 months or less. The goal of hospice is the preservation of dignity and quality at the end phases of life. Under hospice care, the focus  changes from curative to symptom relief. I explained the three setting where hospice services can be provided including the home, at a living facility (such as LTC SNF, Assisted Living, etc), and a hospice facility. I explained that acceptance to hospice in any specific location is the final decision of the hospice medical director and bed availability, if applicable. They verbalized understanding.  Addendum for conversation the family was all in agreement with transition to a comfort focused approach.  I shared medications that would be used to manage his symptoms.  However, they do not want to transfer out of the hospital to hospice.  They shared that when the patient was speaking he he told him he does not want to go to a hospice facility.  I shared that I would update the medical team.  I shared a palliative medicine will continue to follow daily for symptom management. I provided emotional and general support through therapeutic listening, empathy, sharing of stories, therapeutic touch, and other techniques. I answered all questions and addressed all concerns to the best of my ability.  After seeing the patient I updated the medical team and the nurse.  I shared the medications that will be made available including parenteral opioids with a short duration of availability as well as parenteral benzodiazepines.  I cautioned he remains at risk for seizures, although I am leaving IV antiepileptics in place to help mitigate this risk.  I encouraged her to reach out to the palliative team for any problems or needs.  Review of Systems  Unable to perform ROS   Objective:   Primary Diagnoses: Present on Admission:  Syncope and collapse  Essential hypertension  Lung cancer metastatic to brain [facility])   Vital Signs:  BP 117/83 (BP Location: Right Arm)   Pulse (!) 110   Temp 99.3 F (37.4 C) (Oral)   Resp 20   Ht 5' 7 (1.702 m)   Wt 75.8 kg   SpO2 97%   BMI 26.16 kg/m   Physical Exam Vitals  and nursing note reviewed.  Constitutional:      General: He is sleeping.     Comments: I elected to not wake him  HENT:     Head: Normocephalic and atraumatic.  Pulmonary:     Effort: Pulmonary effort is normal. No respiratory distress.  Abdominal:     General: Abdomen is flat.     Palpations: Abdomen is soft.  Skin:    General: Skin is warm and dry.  Neurological:     Mental Status: He is confused.     Comments: Noted seizure as per note above     Palliative Assessment/Data: 10%   Existing Vynca/ACP Documentation: Advance directive signed 05/09/2024 Goals of care document signed 05/12/2024  Assessment & Plan:   HPI/Patient Profile:  57 y.o. male  with past medical history of metastatic lung cancer with mets to the brain currently on active radiation therapy, HTN.  He presented from home to the ED on 05/29/2024 with a five minute unresponsive episode. He was admitted on 05/29/2024 with blank stare/syncopal episode, uncontrolled pain, lung cancer with brain metastasis, and others.    Palliative medicine was consulted for GOC conversations.  SUMMARY OF RECOMMENDATIONS   Changed to DNR-comfort (DNR/DNI) Transition to comfort care Patient does not want to go to hospice, remain in house for EOL See symptom management orders below Palliative medicine will continue to follow  Symptom Management:  Tylenol  650 mg PR every 6 hours as needed mild pain (1-3), fever Biotene oral solution 15 mL topical as needed dry mouth Artificial tears 1 drop OU 4 times daily as needed dry eyes Robinul  0.2 mg IV every 4 hours as needed excessive secretions Haldol  0.5 mg IV every 4 hours as needed agitation or delirium Dilaudid  0.5 to 2 mg IV every 30 minutes as needed severe pain (7-10), signs/symptoms of distress CONTINUE Keppra  1500 mg IV every 12 hours for seizure prophylaxis Ativan  1 mg IV every 4 hours as needed anxiety or seizure Zofran  4 mg IV every 6 hours as needed nausea CONTINUE  Dilantin  100 mg IV every 8 hours for seizure prophylaxis Phenergan  6.25 mg IV every 6 hours as needed refractory nausea/vomiting  Code Status: DNR - Comfort  Prognosis: Hours - Days  Discharge Planning: Anticipated Hospital Death  Discussed with: Patient, family, medical team, nursing team  Thank you for allowing us  to participate in the care of KOBYN KRAY PMT will continue to support holistically.  Billing based on MDM: High  Problems Addressed: One acute or chronic illness or injury that poses a threat to life or bodily function  Risks: Parenteral controlled substances  Detailed review of medical records (labs, imaging, vital signs), medically appropriate exam, discussed with treatment team, counseling and education to patient, family, & staff, documenting clinical information, medication management, coordination of care  Camellia Kays, NP Palliative Medicine Team  Team Phone # [phone] (Nights/Weekends)  02/19/2021, 8:17 AM

## 2024-06-01 NOTE — Care Management Important Message (Signed)
 Important Message  Patient Details  Name: Lucas Yu MRN: [id number] Date of Birth: [birth year]   Important Message Given:  Yes - Medicare IM     Saketh Daubert W, CMA 06/01/2024, 12:31 PM

## 2024-06-01 NOTE — TOC Progression Note (Addendum)
 Transition of Care [facility]) - Progression Note    Patient Details  Name: Lucas Yu MRN: [id number] Date of Birth: 1966/01/07  Transition of Care [facility]) CM/SW Contact  Dalia GORMAN Fuse, RN Phone Number: 06/01/2024, 10:05 AM  Clinical Narrative:     No bed offers at this time, some noted OON, active radiation tx, and ethoh history.  TOC extended bed search. TOC will continue to follow.    [facility] Preferred SNF  Declined Payer/insurance denied or not accepted -- [address], [place] [address] [phone] --  [facility] [place] [facility] meet patient's needs -- [address], [place] [address] [phone] --  [facility] CARE SNF  Declined No bed availablity -- [address], [place] [address] [phone] --  [facility] Preferred SNF  Declined -- [address], [place] [address] [phone] --  [facility]  Declined Out of network -- [address], [place] [address] [phone] --  [facility] Preferred SNF  Declined History of violence and/or drug or alcohol abuse -- [address], [place] [address] [phone] --  [facility] Preferred SNF  Declined No bed availablity -- [address], [place] [address] [phone] --  [facility] [place] Preferred SNF  Declined Cannot meet patient's needs -- [address] [place] [address] [phone] --  [facility] Preferred SNF                          Expected Discharge Plan and Services                                               Social Drivers of Health (SDOH) Interventions SDOH Screenings   Food Insecurity: No Food Insecurity  (05/29/2024)  Housing: High Risk (05/29/2024)  Transportation Needs: No Transportation Needs (05/29/2024)  Utilities: Not At Risk (05/29/2024)  Depression (PHQ2-9): Medium Risk (05/12/2024)  Social Connections: Unknown (05/29/2024)  Tobacco Use: Medium Risk (05/29/2024)    Readmission Risk Interventions     No data to display

## 2024-06-01 NOTE — Progress Notes (Signed)
 Subjective: Family reports no further seizures noted.  Still at times screams out in pain.  Decision has been made for comfort care.    Objective: Current vital signs: BP 117/83 (BP Location: Right Arm)   Pulse (!) 110   Temp 99.3 F (37.4 C) (Oral)   Resp 20   Ht 5' 7 (1.702 m)   Wt 75.8 kg   SpO2 97%   BMI 26.16 kg/m  Vital signs in last 24 hours: Temp:  [98.4 F (36.9 C)-99.6 F (37.6 C)] 99.3 F (37.4 C) (12/10 0743) Pulse Rate:  [104-116] 110 (12/10 0743) Resp:  [18-20] 20 (12/10 0743) BP: (117-152)/(81-94) 117/83 (12/10 0743) SpO2:  [97 %-100 %] 97 % (12/10 0743)  Intake/Output from previous day: 12/09 0701 - 12/10 0700 In: 558.9 [P.O.:120; I.V.:423.9; IV Piggyback:15] Out: 400 [Urine:400] Intake/Output this shift: Total I/O In: -  Out: 200 [Urine:200] Nutritional status:  Diet Order             Diet regular Room service appropriate? Yes; Fluid consistency: Thin  Diet effective now                   Neurologic Exam: Lethargic. Continued facial droop Speech dysarthric but fluent  Lab Results: Basic Metabolic Panel: Recent Labs  Lab 05/27/24 0737 05/29/24 0452 05/29/24 1026 05/30/24 0635 05/31/24 0601  NA 125* 128*  --  127* 129*  K 4.6 4.5  --  4.0 3.7  CL 90* 91*  --  95* 92*  CO2 22 24  --  22 22  GLUCOSE 111* 102*  --  130* 101*  BUN 23* 19  --  8 6  CREATININE 1.03 1.02 0.87 0.72 0.74  CALCIUM 9.9 9.9  --  9.1 9.6  MG  --  2.5*  --   --   --     Liver Function Tests: Recent Labs  Lab 05/27/24 0737 05/29/24 0452 05/30/24 0635 05/31/24 0601  AST 53* 69* 48* 70*  ALT 23 29 13 25   ALKPHOS 147* 164* 133* 143*  BILITOT 0.4 0.4 0.4 0.4  PROT 8.3* 8.2* 6.6 7.1  ALBUMIN 3.8 3.7 2.8* 3.2*   No results for input(s): LIPASE, AMYLASE in the last 168 hours. No results for input(s): AMMONIA in the last 168 hours.  CBC: Recent Labs  Lab 05/27/24 0737 05/29/24 0452 05/29/24 1026 05/30/24 0635 05/31/24 0601  WBC 9.1 9.6 8.7  7.8 10.1  HGB 13.6 13.5 12.4* 11.3* 11.3*  HCT 40.1 39.4 36.9* 33.4* 32.2*  MCV 89.1 89.7 90.9 91.0 88.5  PLT 343 352 332 303 357    Cardiac Enzymes: Recent Labs  Lab 05/29/24 0452  CKTOTAL 87    Lipid Panel: No results for input(s): CHOL, TRIG, HDL, CHOLHDL, VLDL, LDLCALC in the last 168 hours.  CBG: Recent Labs  Lab 05/29/24 0444  GLUCAP 96    Microbiology: Results for orders placed or performed during the hospital encounter of 05/23/24  Culture, blood (routine x 2)     Status: None   Collection Time: 05/23/24 12:11 PM   Specimen: BLOOD LEFT FOREARM  Result Value Ref Range Status   Specimen Description BLOOD LEFT FOREARM  Final   Special Requests   Final    BOTTLES DRAWN AEROBIC AND ANAEROBIC Blood Culture results may not be optimal due to an inadequate volume of blood received in culture bottles   Culture   Final    NO GROWTH 5 DAYS Performed at [facility], [address], [place], [place] [address]    Report Status 05/28/2024 FINAL  Final    Coagulation Studies: Recent Labs    05/30/24 0635  LABPROT 15.0  INR 1.1    Imaging: EEG adult Result Date: 05/30/2024 Shelton Arlin KIDD, MD     05/30/2024  8:05 PM Patient Name: Lucas Yu MRN: [id number] Epilepsy Attending: Arlin KIDD Shelton Referring Physician/Provider: Germaine Raring, MD Date: 05/30/2024 Duration: 26.38 mins Patient history: 58 y.o. male with medical history significant for metastatic lung cancer with mets to the brain currently on active radiation therapy, seizures, HTN noted to have multiple staring spells. EEG to evaluate for seizure Level of alertness: Awake AEDs during EEG study: LEV, PHT, PGB Technical aspects: This EEG study was done with scalp electrodes positioned according to the 10-20 International system of electrode placement. Electrical activity was reviewed with band pass filter of 1-70Hz , sensitivity of 7 uV/mm, display speed of 15mm/sec with a 60Hz  notched filter  applied as appropriate. EEG data were recorded continuously and digitally stored.  Video monitoring was available and reviewed as appropriate. Description: The posterior dominant rhythm consists of 8 Hz activity of moderate voltage (25-35 uV) seen predominantly in posterior head regions, symmetric and reactive to eye opening and eye closing. EEG showed intermittent generalized 3 to 6 Hz theta-delta slowing. Hyperventilation and photic stimulation were not performed.   During the study at 1515, patient reported having a seizure. Concomitant EEG before, during and after the event showed normal posterior dominant rhythm and did not show any EEG changes  to suggest seizure.  ABNORMALITY - Intermittent slow, generalized  IMPRESSION: This study is suggestive of generalized cerebral dysfunction ( encephalopathy). No seizures or epileptiform discharges were seen throughout the recording. During the study at 1515, patient reported having a seizure without concomitant EEG change. This was mot likely not an epileptic event.  Priyanka O Yadav    Medications: I have reviewed the patient's current medications. Scheduled:  levETIRAcetam   1,500 mg Intravenous Q12H   nicotine   7 mg Transdermal Daily   pantoprazole  (PROTONIX ) IV  40 mg Intravenous Daily   phenytoin  (DILANTIN ) IV  100 mg Intravenous Q8H    Assessment/Plan:  58 y.o. male with medical history significant for metastatic lung cancer with mets to the brain currently on active radiation therapy, seizures, HTN noted to have seizures.  Now on comfort care.  Family has noted no seizures on the Keppra  and Dilantin  IV.  They have been advised that with the brain lesions they may at times see some starring but as long as he is not having GTC seizures will not likely be more aggressive with his anticonvulsant therapy.  They expressed understanding.    Recommendations: Will see as needed   LOS: 2 days   Raring Germaine, MD Neurology  06/01/2024  12:38  PM

## 2024-06-01 NOTE — Consult Note (Signed)
 [name] CONSULT NOTE  Patient Care Team: [facility] as PCP - General  CHIEF COMPLAINTS/PURPOSE OF CONSULTATION: Lung cancer  Oncology History   No history exists.    HISTORY OF PRESENTING ILLNESS: Patient in bed. Accompanied by family.  Patient currently snoring/sleeping-unable to wake up recent pain medication.  History as per the family including wife/mother  Lucas Yu 58 y.o.  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  Of note patient recently diagnosed with left sided non-small cell lung cancer-had metastasized to brain and also adrenal gland.  Patient status post evaluation with Dr. Sherrod for consideration of systemic therapy.  Patient also underwent SRS of his brain lesion with Dr. Izell in [place].  Patient had further workup planned-in [facility] including a PET scan-however as mentioned above patient admitted to hospital for episode of possible seizure.  Patient underwent further evaluation with MRI brain-that showed 2 lesions-18 mm and up 11 mm with vasogenic edema.  Patient treated with steroids.   However given intractable pain-patient is currently on narcotics.   Palliative care has been consulted has been following the patient.  Oncology has been consulted to discuss any systemic therapy options prior to proceeding with comfort care.   Review of Systems  Unable to perform ROS: Mental status change    MEDICAL HISTORY:  Past Medical History:  Diagnosis Date   Brain tumor (benign) (HCC)    Hypertension    Lung cancer (HCC)     SURGICAL HISTORY: Past Surgical History:  Procedure Laterality Date   VIDEO BRONCHOSCOPY WITH ENDOBRONCHIAL NAVIGATION N/A 05/05/2024   Procedure: VIDEO BRONCHOSCOPY WITH ENDOBRONCHIAL NAVIGATION;  Surgeon: Kara Dorn NOVAK, MD;  Location: [facility] ENDOSCOPY;  Service: Pulmonary;   Laterality: N/A;   VIDEO BRONCHOSCOPY WITH ENDOBRONCHIAL ULTRASOUND Right 05/05/2024   Procedure: BRONCHOSCOPY, WITH EBUS;  Surgeon: Kara Dorn NOVAK, MD;  Location: [facility] ENDOSCOPY;  Service: Pulmonary;  Laterality: Right;    SOCIAL HISTORY: Social History   Socioeconomic History   Marital status: Single    Spouse name: Not on file   Number of children: Not on file   Years of education: Not on file   Highest education level: Not on file  Occupational History   Not on file  Tobacco Use   Smoking status: Former    Current packs/day: 1.00    Average packs/day: 1 pack/day for 44.0 years (44.0 ttl pk-yrs)    Types: Cigarettes   Smokeless tobacco: Never   Tobacco comments:    4-5 cigarettes per day currently  Vaping Use   Vaping status: Never Used  Substance and Sexual Activity   Alcohol use: Not Currently    Alcohol/week: 7.0 standard drinks of alcohol    Types: 7 Cans of beer per week   Drug use: Yes    Types: Marijuana   Sexual activity: Yes  Other Topics Concern   Not on file  Social History Narrative   Not on file   Social Drivers of Health   Financial Resource Strain: Not on file  Food Insecurity: No Food Insecurity (05/29/2024)   Hunger Vital Sign    Worried About Running Out of Food in the Last Year: Never true    Ran Out of Food in the Last Year: Never true  Transportation Needs: No Transportation Needs (05/29/2024)   PRAPARE - Administrator, Civil Service (Medical): No  Lack of Transportation (Non-Medical): No  Physical Activity: Not on file  Stress: Not on file  Social Connections: Unknown (05/29/2024)   Social Connection and Isolation Panel    Frequency of Communication with Friends and Family: Patient declined    Frequency of Social Gatherings with Friends and Family: Patient declined    Attends Religious Services: Patient declined    Database Administrator or Organizations: Patient declined    Attends Banker Meetings: Patient  declined    Marital Status: Living with partner  Intimate Partner Violence: Patient Unable To Answer (05/29/2024)   Humiliation, Afraid, Rape, and Kick questionnaire    Fear of Current or Ex-Partner: Patient unable to answer    Emotionally Abused: Patient unable to answer    Physically Abused: Patient unable to answer    Sexually Abused: Patient unable to answer    FAMILY HISTORY: Family History  Problem Relation Age of Onset   Hypertension Father    Cancer Father     ALLERGIES:  is allergic to penicillins.  MEDICATIONS:  Current Facility-Administered Medications  Medication Dose Route Frequency Provider Last Rate Last Admin   acetaminophen  (TYLENOL ) tablet 650 mg  650 mg Oral Q6H PRN Roann Gouty, MD       Or   acetaminophen  (TYLENOL ) suppository 650 mg  650 mg Rectal Q6H PRN Roann Gouty, MD       antiseptic oral rinse (BIOTENE) solution 15 mL  15 mL Topical PRN Marvis Camellia LABOR, NP       artificial tears ophthalmic solution 1 drop  1 drop Both Eyes QID PRN Marvis Camellia LABOR, NP       glycopyrrolate  (ROBINUL ) tablet 1 mg  1 mg Oral Q4H PRN Marvis Camellia LABOR, NP       Or   glycopyrrolate  (ROBINUL ) injection 0.2 mg  0.2 mg Subcutaneous Q4H PRN Marvis Camellia LABOR, NP       Or   glycopyrrolate  (ROBINUL ) injection 0.2 mg  0.2 mg Intravenous Q4H PRN Marvis Camellia LABOR, NP       haloperidol  (HALDOL ) tablet 0.5 mg  0.5 mg Oral Q4H PRN Marvis Camellia LABOR, NP       Or   haloperidol  (HALDOL ) 2 MG/ML solution 0.5 mg  0.5 mg Sublingual Q4H PRN Marvis Camellia LABOR, NP       Or   haloperidol  lactate (HALDOL ) injection 0.5 mg  0.5 mg Intravenous Q4H PRN Marvis Camellia LABOR, NP       HYDROmorphone  (DILAUDID ) injection 0.5-2 mg  0.5-2 mg Intravenous Q30 min PRN Marvis Camellia LABOR, NP   2 mg at 06/01/24 1304   levETIRAcetam  (KEPPRA ) undiluted injection 1,500 mg  1,500 mg Intravenous Q12H Lenon Pons L, MD   1,500 mg at 06/01/24 1146   LORazepam  (ATIVAN ) injection 1 mg  1 mg Intravenous Q4H PRN Anderson, Chelsey L, MD   1 mg at 06/01/24  9490   nicotine  (NICODERM CQ  - dosed in mg/24 hr) patch 7 mg  7 mg Transdermal Daily Paudel, Gouty, MD   7 mg at 06/01/24 1020   ondansetron  (ZOFRAN ) tablet 4 mg  4 mg Oral Q6H PRN Roann Gouty, MD       Or   ondansetron  (ZOFRAN ) injection 4 mg  4 mg Intravenous Q6H PRN Paudel, Keshab, MD   4 mg at 05/30/24 1352   pantoprazole  (PROTONIX ) injection 40 mg  40 mg Intravenous Daily Lenon Pons CROME, MD   40 mg at 06/01/24 1022   phenytoin  (DILANTIN ) injection  100 mg  100 mg Intravenous Q8H Lenon Pons L, MD   100 mg at 06/01/24 9297   promethazine  (PHENERGAN ) 6.25 mg/NS 50 mL IVPB  6.25 mg Intravenous Q6H PRN Lenon Pons CROME, MD   Stopped at 05/30/24 1732    PHYSICAL EXAMINATION:   Vitals:   06/01/24 0415 06/01/24 0743  BP: 130/82 117/83  Pulse: (!) 107 (!) 110  Resp: 19 20  Temp: 98.7 F (37.1 C) 99.3 F (37.4 C)  SpO2: 99% 97%   Filed Weights   05/29/24 0445  Weight: 167 lb (75.8 kg)   Patient's somnolent; unable to wake up.  Accompanied by multiple family numbers. Physical Exam Vitals and nursing note reviewed.  HENT:     Head: Normocephalic and atraumatic.  Cardiovascular:     Rate and Rhythm: Normal rate and regular rhythm.  Pulmonary:     Comments: Decreased breath sounds bilaterally.  Abdominal:     Palpations: Abdomen is soft.  Musculoskeletal:        General: Normal range of motion.     Cervical back: Normal range of motion.  Skin:    General: Skin is warm.     LABORATORY DATA:  I have reviewed the data as listed Lab Results  Component Value Date   WBC 10.1 05/31/2024   HGB 11.3 (L) 05/31/2024   HCT 32.2 (L) 05/31/2024   MCV 88.5 05/31/2024   PLT 357 05/31/2024   Recent Labs    05/29/24 0452 05/29/24 1026 05/30/24 0635 05/31/24 0601  NA 128*  --  127* 129*  K 4.5  --  4.0 3.7  CL 91*  --  95* 92*  CO2 24  --  22 22  GLUCOSE 102*  --  130* 101*  BUN 19  --  8 6  CREATININE 1.02 0.87 0.72 0.74  CALCIUM 9.9  --  9.1 9.6  GFRNONAA  >60 >60 >60 >60  PROT 8.2*  --  6.6 7.1  ALBUMIN 3.7  --  2.8* 3.2*  AST 69*  --  48* 70*  ALT 29  --  13 25  ALKPHOS 164*  --  133* 143*  BILITOT 0.4  --  0.4 0.4    RADIOGRAPHIC STUDIES: I have personally reviewed the radiological images as listed and agreed with the findings in the report. EEG adult Result Date: 05/30/2024 Shelton Arlin KIDD, MD     05/30/2024  8:05 PM Patient Name: DEMONTE DOBRATZ MRN: [id number] Epilepsy Attending: Arlin KIDD Shelton Referring Physician/Provider: Germaine Raring, MD Date: 05/30/2024 Duration: 26.38 mins Patient history: 58 y.o. male with medical history significant for metastatic lung cancer with mets to the brain currently on active radiation therapy, seizures, HTN noted to have multiple staring spells. EEG to evaluate for seizure Level of alertness: Awake AEDs during EEG study: LEV, PHT, PGB Technical aspects: This EEG study was done with scalp electrodes positioned according to the 10-20 International system of electrode placement. Electrical activity was reviewed with band pass filter of 1-70Hz , sensitivity of 7 uV/mm, display speed of 83mm/sec with a 60Hz  notched filter applied as appropriate. EEG data were recorded continuously and digitally stored.  Video monitoring was available and reviewed as appropriate. Description: The posterior dominant rhythm consists of 8 Hz activity of moderate voltage (25-35 uV) seen predominantly in posterior head regions, symmetric and reactive to eye opening and eye closing. EEG showed intermittent generalized 3 to 6 Hz theta-delta slowing. Hyperventilation and photic stimulation were not performed.   During the study  at 1515, patient reported having a seizure. Concomitant EEG before, during and after the event showed normal posterior dominant rhythm and did not show any EEG changes  to suggest seizure.  ABNORMALITY - Intermittent slow, generalized  IMPRESSION: This study is suggestive of generalized cerebral dysfunction (  encephalopathy). No seizures or epileptiform discharges were seen throughout the recording. During the study at 1515, patient reported having a seizure without concomitant EEG change. This was mot likely not an epileptic event.  Arlin MALVA Krebs   MR Brain W and Wo Contrast Result Date: 05/29/2024 EXAM: MRI BRAIN WITH AND WITHOUT CONTRAST 05/29/2024 07:22:47 AM TECHNIQUE: Multiplanar multisequence MRI of the head/brain was performed with and without the administration of 7 mL of gadobutrol  (GADAVIST ) 1 MMOL/ML injection. COMPARISON: MRI of the head dated 05/10/2024. 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 FINDINGS: LIMITATIONS/ARTIFACTS: This study is limited by patient motion. BRAIN AND VENTRICLES: No acute infarct. No acute intracranial hemorrhage. No mass effect or midline shift. No hydrocephalus. The sella is unremarkable. Normal flow voids. A round peripherally enhancing mass is again demonstrated in the right posterior frontal lobe, again measuring approximately 18 mm in diameter. There is moderate surrounding vasogenic edema. A round enhancing mass is also again demonstrated at the left parietooccipital junction, again measuring 11 to 12 mm in diameter, similarly unchanged. A small subcentimeter lesion is again demonstrated within the left cerebellar hemisphere, best visualized on coronal image 12 of series 19. The punctate lesion previously seen medially within the left frontal lobe is not convincingly demonstrated on the current exam, but the axial images are significantly limited. There are no new lesions evident. ORBITS: No acute abnormality. SINUSES: No acute abnormality. BONES AND SOFT TISSUES: Normal bone marrow signal and enhancement. No acute soft tissue abnormality. IMPRESSION: 1. Stable round peripherally enhancing masses in the right posterior frontal lobe (18 mm) and left parietooccipital  junction (11-12 mm) with moderate surrounding vasogenic edema. 2. Small subcentimeter lesion in the left cerebellar hemisphere. 3. No new lesions evident. Evaluation limited by patient motion. Electronically signed by: Evalene Coho MD 05/29/2024 08:07 AM EST RP Workstation: [id number]   DG Chest Port 1 View Result Date: 05/23/2024 EXAM: 1 VIEW(S) XRAY OF THE CHEST 05/23/2024 11:33:00 AM COMPARISON: 05/05/2024 CLINICAL HISTORY: sob FINDINGS: LUNGS AND PLEURA: Stable right upper lobe 3.7 cm mass. No pleural effusion. No pneumothorax. HEART AND MEDIASTINUM: No acute abnormality of the cardiac and mediastinal silhouettes. BONES AND SOFT TISSUES: No acute osseous abnormality. IMPRESSION: 1. Stable right upper lobe 3.7 cm mass, unchanged, which may contribute to shortness of breath. 2. No acute findings. Electronically signed by: Waddell Calk MD 05/23/2024 12:04 PM EST RP Workstation: [id number]   CT Head Wo Contrast Result Date: 05/23/2024 EXAM: CT HEAD WITHOUT CONTRAST 05/23/2024 10:46:00 AM TECHNIQUE: CT of the head was performed without the administration of intravenous contrast. Automated exposure control, iterative reconstruction, and/or weight based adjustment of the mA/kV was utilized to reduce the radiation dose to as low as reasonably achievable. COMPARISON: CT head 05/18/2024 and MRI brain 05/10/2024. CLINICAL HISTORY: Mental status change, unknown cause; ams / headache / hx of brain mets. FINDINGS: BRAIN AND VENTRICLES: No acute hemorrhage. No evidence of acute infarct. No hydrocephalus. No extra-axial collection. Overall similar bilateral cerebral metastases with surrounding hypoattenuation likely representing a component of vasogenic edema, and associated regional mass effect without midline shift. Overall similar mild background scattered white matter hypodensities which  are nonspecific but most commonly represent chronic microvascular ischemic changes. ORBITS: No acute abnormality. SINUSES:  Chronic left lamina papyracea deformity. SOFT TISSUES AND SKULL: No acute soft tissue abnormality. No skull fracture. IMPRESSION: 1. No acute intracranial hemorrhage or evidence of recently demarcated transcortical infarction. 2. Since 05/18/2024, overall similar bilateral cerebral metastases with associated regional mass effect. No midline shift or brain herniation. Electronically signed by: prentice bybordi 05/23/2024 11:46 AM EST RP Workstation: [id number]   EEG adult Result Date: 05/19/2024 Gregg Lek, MD     05/19/2024  2:39 PM Patient Name: FRIEND DORFMAN MRN: [id number] Epilepsy Attending: Lek Gregg Referring Physician/Provider: No ref. provider found     Date: 05/19/2024 Duration: 23 minutes Patient history: 58 year old man with seizure like activity. EEG for further evaluation Level of alertness: Awake, drowsy AEDs during EEG study: Technical aspects: This EEG study was done with scalp electrodes positioned according to the 10-20 International system of electrode placement. Electrical activity was reviewed with band pass filter of 1-70Hz , sensitivity of 7 uV/mm, display speed of 67mm/sec with a 60Hz  notched filter applied as appropriate. EEG data were recorded continuously and digitally stored.  Video monitoring was available and reviewed as appropriate. Description: The posterior dominant rhythm consists of 9-10 Hz activity of moderate voltage (25-35 uV) seen predominantly in posterior head regions, symmetric and reactive to eye opening and eye closing. Drowsiness was characterized by attenuation of the posterior background rhythm. Sleep was not seen. Hyperventilation and photic stimulation were not performed.   IMPRESSION: This study is within normal limits. No seizures or epileptiform discharges were seen throughout the recording. A normal interictal EEG does not exclude nor support the diagnosis of epilepsy. Lek Gregg MD Neurology    CT HEAD CODE STROKE WO CONTRAST (LKW 0-4.5h, LVO  0-24h) Result Date: 05/18/2024 EXAM: CT HEAD WITHOUT 05/18/2024 07:17:58 PM TECHNIQUE: CT of the head was performed without the administration of intravenous contrast. Automated exposure control, iterative reconstruction, and/or weight based adjustment of the mA/kV was utilized to reduce the radiation dose to as low as reasonably achievable. COMPARISON: Comparison made with prior MRI from 05/10/2024. CLINICAL HISTORY: Neuro deficit, acute, stroke suspected. FINDINGS: BRAIN AND VENTRICLES: Previously identified intracranial metastases involving the right frontal and left parietal lobes, again seen, and grossly similar to previous. Surrounding vasogenic edema without midline shift, also similar. No visible new lesions on this noncontrast examination. No acute intracranial hemorrhage. No evidence of acute infarct. No hydrocephalus. No extra-axial fluid collection. Calcified atherosclerosis present about the skull base. ORBITS: No acute abnormality. SINUSES AND MASTOIDS: No acute abnormality. SOFT TISSUES AND SKULL: No acute skull fracture. No acute soft tissue abnormality. IMPRESSION: 1. No acute intracranial abnormality. 2. Aspects is 10. 3. Stable intracranial metastases involving the right frontal and left parietal lobes with surrounding vasogenic edema, without midline shift, similar to prior MRI from 05/10/2024. 4. Findings communicated by text page to Dr. Michaela at 7:28 pm on 05/18/2024. Electronically signed by: Morene Hoard MD 05/18/2024 07:31 PM EST RP Workstation: [id number]   VAS US  LOWER EXTREMITY VENOUS (DVT) Result Date: 05/11/2024  Lower Venous DVT Study Patient Name:  DENMAN PICHARDO  Date of Exam:   05/11/2024 Medical Rec #: [id number]      Accession #:    [id number] Date of Birth: Jun 20, 1966      Patient Gender: M Patient Age:   64 years Exam Location:  [facility] Procedure:      VAS US  LOWER EXTREMITY VENOUS (DVT) Referring Phys: LEEROY  MUIR  --------------------------------------------------------------------------------  Indications: Pain.  Risk Factors: Cancer. Comparison Study: No prior studies. Performing Technologist: Cordella Collet RVT  Examination Guidelines: A complete evaluation includes B-mode imaging, spectral Doppler, color Doppler, and power Doppler as needed of all accessible portions of each vessel. Bilateral testing is considered an integral part of a complete examination. Limited examinations for reoccurring indications may be performed as noted. The reflux portion of the exam is performed with the patient in reverse Trendelenburg.  +---------+---------------+---------+-----------+----------+--------------+ RIGHT    CompressibilityPhasicitySpontaneityPropertiesThrombus Aging +---------+---------------+---------+-----------+----------+--------------+ CFV      Full           Yes      Yes                                 +---------+---------------+---------+-----------+----------+--------------+ SFJ      Full                                                        +---------+---------------+---------+-----------+----------+--------------+ FV Prox  Full                                                        +---------+---------------+---------+-----------+----------+--------------+ FV Mid   Full                                                        +---------+---------------+---------+-----------+----------+--------------+ FV DistalFull                                                        +---------+---------------+---------+-----------+----------+--------------+ PFV      Full                                                        +---------+---------------+---------+-----------+----------+--------------+ POP      Full           Yes      Yes                                 +---------+---------------+---------+-----------+----------+--------------+ PTV      Full                                                         +---------+---------------+---------+-----------+----------+--------------+ PERO     Full                                                        +---------+---------------+---------+-----------+----------+--------------+   +---------+---------------+---------+-----------+----------+--------------+  LEFT     CompressibilityPhasicitySpontaneityPropertiesThrombus Aging +---------+---------------+---------+-----------+----------+--------------+ CFV      Full           Yes      Yes                                 +---------+---------------+---------+-----------+----------+--------------+ SFJ      Full                                                        +---------+---------------+---------+-----------+----------+--------------+ FV Prox  Full                                                        +---------+---------------+---------+-----------+----------+--------------+ FV Mid   Full                                                        +---------+---------------+---------+-----------+----------+--------------+ FV DistalFull                                                        +---------+---------------+---------+-----------+----------+--------------+ PFV      Full                                                        +---------+---------------+---------+-----------+----------+--------------+ POP      Full           Yes      Yes                                 +---------+---------------+---------+-----------+----------+--------------+ PTV      Full                                                        +---------+---------------+---------+-----------+----------+--------------+ PERO     Full                                                        +---------+---------------+---------+-----------+----------+--------------+     Summary: RIGHT: - There is no evidence of deep vein thrombosis in the lower  extremity.  - No cystic structure found in the popliteal fossa.  LEFT: - There is no evidence of deep vein thrombosis in the lower extremity.  - No  cystic structure found in the popliteal fossa.  *See table(s) above for measurements and observations. Electronically signed by Lonni Gaskins MD on 05/11/2024 at 3:26:26 PM.    Final    MR Brain W Wo Contrast Result Date: 05/11/2024 EXAM: MRI BRAIN WITH AND WITHOUT CONTRAST 05/10/2024 04:46:18 PM TECHNIQUE: Multiplanar multisequence MRI of the head/brain was performed with and without the administration of intravenous contrast. CONTRAST: 7 mL of Gadavist . COMPARISON: MR Head 05/03/2024. CLINICAL HISTORY: 58 year old male with brain metastases, pretreatment planning. FINDINGS: BRAIN AND VENTRICLES: Subcentimeter left superior cerebellar enhancing metastasis measures 3 to 4 mm on series 1100 image 124. Left superior occipital versus inferior parietal lobe thick rim enhancing metastasis is 12 mm on series 1100 image 172. More solid round and mildly lobulated enhancing metastasis in the posterior right frontal lobe is 18 mm on series 1100 image 198. Punctate enhancing metastasis in the anterior left frontal lobe is visible on both series 1100 image 204 and series 9 image 15. No other brain metastases identified. Vasogenic edema in the posterior right frontal lobe and the posterior left hemisphere is stable. There is no significant midline shift or intracranial mass effect. Normal underlying brain volume. No convincing intracranial hemorrhage. No acute infarct. No hydrocephalus. The sella is unremarkable. Normal flow voids. No dural thickening identified. Negative visible cervical spine and spinal cord. ORBITS: No acute abnormality. SINUSES: No acute abnormality. BONES AND SOFT TISSUES: Visible bone marrow signal is within normal limits. Negative visible cervical spine. No acute soft tissue abnormality. IMPRESSION: 1. Total of four enhancing brain metastases on 3T  ranging from punctate (anterior left frontal) to 18 mm (posterior right frontal), all annotated on series 1100. 2. Stable vasogenic edema in the posterior right frontal lobe and posterior left hemisphere. No significant intracranial mass effect. Electronically signed by: Helayne Hurst MD 05/11/2024 09:48 AM EST RP Workstation: [id number]   DG CHEST PORT 1 VIEW Result Date: 05/05/2024 CLINICAL DATA:  Post bronchoscopy with biopsy. EXAM: PORTABLE CHEST 1 VIEW COMPARISON:  05/03/2024 and CT 05/03/2024 FINDINGS: Lungs are hypoinflated and demonstrate evidence of patient's known right upper lobe nodule which is slightly less well-defined compatible recent bronchoscopy and biopsy. No pneumothorax. Remainder of the lungs are clear. Cardiomediastinal silhouette and remainder of the exam is unchanged. IMPRESSION: Hypoinflation with known right upper lobe nodule. No pneumothorax. Electronically Signed   By: Toribio Agreste M.D.   On: 05/05/2024 13:30   DG C-ARM BRONCHOSCOPY Result Date: 05/05/2024 C-ARM BRONCHOSCOPY: Fluoroscopy was utilized by the requesting physician.  No radiographic interpretation.   MR ABDOMEN W WO CONTRAST Result Date: 05/04/2024 EXAM: MRCP WITH AND WITHOUT IV CONTRAST 05/04/2024 08:24:35 AM TECHNIQUE: Multisequence, multiplanar magnetic resonance images of the abdomen with and without intravenous contrast. 7 mL gadobutrol  (GADAVIST ) 1 MMOL/ML injection was administered. MRCP sequences were performed. COMPARISON: Chest CT 05/03/2024. CLINICAL HISTORY: Lung mass. FINDINGS: LIVER: No enhancing lesion within the liver to suggest a hepatocellular carcinoma. No enhancing lesion to suggest metastatic disease. GALLBLADDER AND BILIARY SYSTEM: Gallbladder is unremarkable. No intrahepatic or extrahepatic ductal dilation. SPLEEN: Unremarkable. PANCREAS/PANCREATIC DUCT: Visualized pancreas is unremarkable. No pancreatic ductal dilatation. ADRENAL GLANDS: Masslike enlargement of the right adrenal gland to  3.6 x 2.3 cm on image 34 of series 802. This right adrenal mass has a suspicious enhancement pattern for metastatic disease. Similar findings in the left adrenal gland which is enlarged to 2.1 x 1.6 cm. KIDNEYS: Unremarkable. LYMPH NODES: No enlarged abdominal lymph nodes. VASCULATURE: Unremarkable. PERITONEUM: No ascites. ABDOMINAL WALL: No hernia. No  mass. BOWEL: Grossly unremarkable. No bowel obstruction. BONES: No acute abnormality or worrisome osseous lesion. SOFT TISSUES: Unremarkable. MISCELLANEOUS: Unremarkable. IMPRESSION: 1. No evidence of hepatocellular carcinoma or metastatic disease in the liver. 2. 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 Electronically signed by: Norleen Boxer MD 05/04/2024 09:30 AM EST RP Workstation: [id number]   CT CHEST W CONTRAST Result Date: 05/04/2024 CLINICAL DATA:  Pulmonary lesion on chest x-ray concerning for neoplasm. * Tracking Code: BO * EXAM: CT CHEST WITH CONTRAST TECHNIQUE: Multidetector CT imaging of the chest was performed during intravenous contrast administration. RADIATION DOSE REDUCTION: This exam was performed according to the departmental dose-optimization program which includes automated exposure control, adjustment of the mA and/or kV according to patient size and/or use of iterative reconstruction technique. CONTRAST:  50mL OMNIPAQUE  IOHEXOL  350 MG/ML SOLN COMPARISON:  Chest x-ray earlier same day.  Chest CT 04/04/2020 FINDINGS: Cardiovascular: The heart size is normal. No substantial pericardial effusion. No thoracic aortic aneurysm. Mediastinum/Nodes: 1.7 cm short axis necrotic precarinal lymph node evident. 1.8 cm short axis necrotic right hilar lymph node. No left hilar lymphadenopathy. The esophagus has normal imaging features. There is no axillary lymphadenopathy. Lungs/Pleura: 3.1 x 3.5 x 3.4 cm right upper lobe pulmonary mass is tethered to the peripheral pleura. No other suspicious pulmonary nodule or mass. No  pulmonary edema or pleural effusion. Centrilobular and paraseptal emphysema evident. Upper Abdomen: 3.7 x 2.8 x 6.7 cm heterogeneously enhancing right adrenal mass evident. 1.9 x 1.8 x 1.8 cm left adrenal nodule. Musculoskeletal: No worrisome lytic or sclerotic osseous abnormality. IMPRESSION: 1. 3.1 x 3.5 x 3.4 cm right upper lobe pulmonary mass is tethered to the peripheral pleura. Imaging features are compatible with primary bronchogenic neoplasm. 2. Necrotic right hilar and precarinal lymphadenopathy consistent with metastatic disease. 3. Bilateral adrenal nodules, right larger than left, consistent with metastatic disease. 4.  Emphysema (ICD10-J43.9). Electronically Signed   By: Camellia Candle M.D.   On: 05/04/2024 05:39   MR Brain W and Wo Contrast Result Date: 05/03/2024 EXAM: MRI BRAIN WITH AND WITHOUT CONTRAST 05/03/2024 02:05:09 PM TECHNIQUE: Multiplanar multisequence MRI of the head/brain was performed with and without the administration of intravenous contrast. 7 mL (gadobutrol  (GADAVIST ) 1 MMOL/ML injection 7 mL GADOBUTROL  1 MMOL/ML IV SOLN). COMPARISON: CT head 05/03/2024. CLINICAL HISTORY: Brain/CNS neoplasm, staging; Neuro deficit, acute, stroke suspected. Slurred speech, facial droop, dysphagia, and left sided numbness/weakness. FINDINGS: LIMITATIONS: The examination is intermittently up to moderately motion degraded. BRAIN AND VENTRICLES: Peripherally enhancing lesions measure 1.7 x 1.3 cm in the posterior right frontal lobe and 1.0 x 0.8 x 1.0 cm in the left parietal lobe. There is moderate associated vasogenic edema, greater in the posterior right frontal lobe, with associated sulcal effacement and slight mass effect on the right greater than left lateral ventricles. There is no midline shift. A punctate focus of susceptibility associated with the right frontal lesion may reflect minimal blood products or mineralization. There is also a punctate 1.5-2 mm enhancing lesion in the superior left  cerebellar hemisphere (series 18 image 48 and series 20 image 15) without edema. There is no restricted diffusion associated with these lesions. No acute infarct, hydrocephalus, or extra axial fluid collection is evident. Cerebral volume is normal. Major intracranial vascular flow voids are preserved. ORBITS: No acute abnormality. SINUSES: No acute abnormality. BONES AND SOFT TISSUES: Normal bone marrow signal and enhancement. Small right frontal scalp lipoma. IMPRESSION: 1. Enhancing lesions with edema in the right frontal and left parietal lobes as  well as a punctate lesion in the left cerebellum, most consistent with metastatic disease. Electronically signed by: Dasie Hamburg MD 05/03/2024 03:24 PM EST RP Workstation: [id number]   DG Chest Port 1 View Result Date: 05/03/2024 EXAM: 1 VIEW(S) XRAY OF THE CHEST 05/03/2024 02:15:00 PM COMPARISON: Chest CT 04/04/2020. CLINICAL HISTORY: Brain lesions, slurred speech, weakness, and dysphasia. Possible metastases in brain. FINDINGS: LUNGS AND PLEURA: 3.2 cm right upper lobe mass. No pulmonary edema. No pleural effusion. No pneumothorax. HEART AND MEDIASTINUM: No acute abnormality of the cardiac and mediastinal silhouettes. BONES AND SOFT TISSUES: No acute osseous abnormality. IMPRESSION: 1. Right upper lobe pulmonary mass measuring 3.2 cm, suspicious for primary lung neoplasm; recommend dedicated chest CT for further characterization and staging evaluation, and oncologic referral. Electronically signed by: Maude Stammer MD 05/03/2024 02:29 PM EST RP Workstation: [id number]   CT HEAD WO CONTRAST Result Date: 05/03/2024 EXAM: CT HEAD WITHOUT CONTRAST 05/03/2024 12:06:07 PM TECHNIQUE: CT of the head was performed without the administration of intravenous contrast. Automated exposure control, iterative reconstruction, and/or weight based adjustment of the mA/kV was utilized to reduce the radiation dose to as low as reasonably achievable. COMPARISON: None available.  CLINICAL HISTORY: Neuro deficit, acute, stroke suspected. FINDINGS: BRAIN AND VENTRICLES: Hyperdense masses in the posterior right frontal lobe and left parietal lobe measure 1.3 cm and 1.0 cm, respectively. Associated moderate vasogenic edema is greater in the right frontal lobe. There is mild regional mass effect without significant midline shift. Elsewhere, no acute infarct, intracranial hemorrhage, hydrocephalus, or extra-axial fluid collection is identified. Cerebral volume is normal. ORBITS: No acute abnormality. SINUSES: No acute abnormality. SOFT TISSUES AND SKULL: No acute soft tissue abnormality. No skull fracture. IMPRESSION: 1. Right frontal and left parietal masses with moderate vasogenic edema. A brain MRI without and with IV contrast is recommended for further evaluation. Electronically signed by: Dasie Hamburg MD 05/03/2024 12:21 PM EST RP Workstation: [id number]     No problem-specific Assessment & Plan notes found for this encounter.  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  Patient not yet started chemotherapy.  # Intractable pain-cancer related  # Brain metastasis- Post SRS-episode of seizure-currently on antiepileptic   # Recommendation/plan:  # I had a long discussion with the patient's wife and also mother-regarding the difficult situation.  Discussed in general stage IV lung cancers cannot be cured at best only treated with chemoimmunotherapy.  However as per family patient has had significant poor quality of life with significant weight loss poor appetite ongoing intractable pain and multiple admissions to hospital-they declined any consideration for any systemic therapy.  With regards to ongoing intractable pain interested in focusing on pain control/symptom  management.  Discussed with palliative care reasonable to consider comfort measures given patient's current medical situation and also family preference.  Thank you  Dr.  Laurita for allowing me to participate in the care of your pleasant patient. Please do not hesitate to contact me with questions or concerns in the interim.Discussed with Dr. Laurita.  Also informed patient's primary oncologist Dr. Mohamed/radiation oncologist Dr. Izell   Above plan of care was discussed with patient/family in detail.  My contact information was given to the patient/family.       Cindy JONELLE Joe, MD 06/01/2024 2:11 PM

## 2024-06-01 NOTE — TOC Progression Note (Signed)
 Transition of Care [facility]) - Progression Note    Patient Details  Name: Lucas Yu MRN: [id number] Date of Birth: 1965-11-19  Transition of [facility]) CM/SW Contact  Dalia GORMAN Fuse, RN Phone Number: 06/01/2024, 11:31 AM  Clinical Narrative:     Received message advising patient is actively transitioning. Patient doesn't wish to go to Hospice, would like a hospital death.                     Expected Discharge Plan and Services                                               Social Drivers of Health (SDOH) Interventions SDOH Screenings   Food Insecurity: No Food Insecurity (05/29/2024)  Housing: High Risk (05/29/2024)  Transportation Needs: No Transportation Needs (05/29/2024)  Utilities: Not At Risk (05/29/2024)  Depression (PHQ2-9): Medium Risk (05/12/2024)  Social Connections: Unknown (05/29/2024)  Tobacco Use: Medium Risk (05/29/2024)    Readmission Risk Interventions     No data to display

## 2024-06-01 NOTE — Hospital Course (Signed)
 Lucas Yu is a 58 y.o. male with a PMH significant for metastatic lung cancer with mets to the brain, HTN, presented from home to the ED on 05/29/2024 with a five minute unresponsive episode.  While in the hospital, patient had a seizure episode. Discussed with the family and oncology, patient condition has been deteriorating, has not been eating for a whole week.  Currently has no appetite.  He also very confused, discussed with oncology, no additional treatment.  As a result, patient to transition to comfort care.  Family and patient wish for him to die in the hospital.

## 2024-06-01 NOTE — Plan of Care (Signed)

## 2024-06-01 NOTE — Progress Notes (Deleted)
 The patient was noted to have congestion. Dr. Cleatus was notified and ordered a neb tx with little effect. This clinical research associate called respiratory and had them to evaluate the patient. Respiratory said that the patient had excess fluid. This clinical research associate made Dr. Cleatus aware of respiratory 's assessment. Dr. Cleatus ordered Lasix 30mg  IV x one dose and Azithromycin IV.

## 2024-06-03 ENCOUNTER — Other Ambulatory Visit (HOSPITAL_COMMUNITY)

## 2024-06-09 ENCOUNTER — Inpatient Hospital Stay

## 2024-06-09 ENCOUNTER — Inpatient Hospital Stay: Admitting: Internal Medicine

## 2024-06-14 ENCOUNTER — Ambulatory Visit: Admitting: Radiology

## 2024-06-23 NOTE — Death Summary Note (Signed)
 DEATH SUMMARY   Patient Details  Name: Lucas Yu MRN: [id number] DOB: 1965-07-12 ERE:[facility] Admission/Discharge Information   Admit Date:  June 13, 2024  Date of Death: Date of Death: Jun 17, 2024  Time of Death: Time of Death: 0130  Length of Stay: 3   Principle Cause of death: Lung cancer with brain mets  Hospital Diagnoses: Principal Problem:   Syncope and collapse Active Problems:   Essential hypertension   Lung cancer metastatic to brain (HCC)   Failure to thrive in adult   Protein-calorie malnutrition, severe   Hospital Course: Lucas Yu is a 59 y.o. 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  While in the hospital, patient had a seizure episode. Discussed with the family and oncology, patient condition has been deteriorating, has not been eating for a whole week.  Currently has no appetite.  He also very confused, discussed with oncology, no additional treatment.  As a result, patient to transition to comfort care.  Family and patient wish for him to die in the hospital.  Assessment and Plan: Lung cancer metastatic to the brain. 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 Delirium secondary to brain mets Failure to thrive secondary to terminal cancer. Discussed with oncology, patient is not a candidate for any additional treatment.  He has not been eating for a whole week, he is very agitated and confused.  It appears that the patient is actively dying.  Will transition to comfort care.  Patient family wish to keep patient in the hospital for the final few days.    Hyponatremia. No additional treatment.   Right sided body pain. Continue pain medicine.   Essential hypertension. No additional treatment.       Procedures: None  Consultations: Oncology  The results of significant diagnostics from this  hospitalization (including imaging, microbiology, ancillary and laboratory) are listed below for reference.   Significant Diagnostic Studies: EEG adult Result Date: 05/30/2024 Shelton Arlin KIDD, MD     05/30/2024  8:05 PM Patient Name: ESTELL DILLINGER MRN: [id number] Epilepsy Attending: Arlin KIDD Shelton Referring Physician/Provider: Germaine Raring, MD Date: 05/30/2024 Duration: 26.38 mins Patient history: 59 y.o. male with medical history significant for metastatic lung cancer with mets to the brain currently on active radiation therapy, seizures, HTN noted to have multiple staring spells. EEG to evaluate for seizure Level of alertness: Awake AEDs during EEG study: LEV, PHT, PGB Technical aspects: This EEG study was done with scalp electrodes positioned according to the 10-20 International system of electrode placement. Electrical activity was reviewed with band pass filter of 1-70Hz , sensitivity of 7 uV/mm, display speed of 41mm/sec with a 60Hz  notched filter applied as appropriate. EEG data were recorded continuously and digitally stored.  Video monitoring was available and reviewed as appropriate. Description: The posterior dominant rhythm consists of 8 Hz activity of moderate voltage (25-35 uV) seen predominantly in posterior head regions, symmetric and reactive to eye opening and eye closing. EEG showed intermittent generalized 3 to 6 Hz theta-delta slowing. Hyperventilation and photic stimulation were not performed.   During the study at 1515, patient reported having a seizure. Concomitant EEG before, during and after the event showed normal posterior dominant rhythm and did not show any EEG changes  to suggest seizure.  ABNORMALITY - Intermittent slow, generalized  IMPRESSION: This study is suggestive of generalized cerebral dysfunction ( encephalopathy). No  seizures or epileptiform discharges were seen throughout the recording. During the study at 1515, patient reported having a seizure without concomitant  EEG change. This was mot likely not an epileptic event.  Arlin MALVA Krebs   MR Brain W and Wo Contrast Result Date: 05/29/2024 EXAM: MRI BRAIN WITH AND WITHOUT CONTRAST 05/29/2024 07:22:47 AM TECHNIQUE: Multiplanar multisequence MRI of the head/brain was performed with and without the administration of 7 mL of gadobutrol  (GADAVIST ) 1 MMOL/ML injection. COMPARISON: MRI of the head dated 05/10/2024. 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 FINDINGS: LIMITATIONS/ARTIFACTS: This study is limited by patient motion. BRAIN AND VENTRICLES: No acute infarct. No acute intracranial hemorrhage. No mass effect or midline shift. No hydrocephalus. The sella is unremarkable. Normal flow voids. A round peripherally enhancing mass is again demonstrated in the right posterior frontal lobe, again measuring approximately 18 mm in diameter. There is moderate surrounding vasogenic edema. A round enhancing mass is also again demonstrated at the left parietooccipital junction, again measuring 11 to 12 mm in diameter, similarly unchanged. A small subcentimeter lesion is again demonstrated within the left cerebellar hemisphere, best visualized on coronal image 12 of series 19. The punctate lesion previously seen medially within the left frontal lobe is not convincingly demonstrated on the current exam, but the axial images are significantly limited. There are no new lesions evident. ORBITS: No acute abnormality. SINUSES: No acute abnormality. BONES AND SOFT TISSUES: Normal bone marrow signal and enhancement. No acute soft tissue abnormality. IMPRESSION: 1. Stable round peripherally enhancing masses in the right posterior frontal lobe (18 mm) and left parietooccipital junction (11-12 mm) with moderate surrounding vasogenic edema. 2. Small subcentimeter lesion in the left cerebellar hemisphere. 3. No new lesions evident. Evaluation  limited by patient motion. Electronically signed by: Evalene Coho MD 05/29/2024 08:07 AM EST RP Workstation: [id number]   DG Chest Port 1 View Result Date: 05/23/2024 EXAM: 1 VIEW(S) XRAY OF THE CHEST 05/23/2024 11:33:00 AM COMPARISON: 05/05/2024 CLINICAL HISTORY: sob FINDINGS: LUNGS AND PLEURA: Stable right upper lobe 3.7 cm mass. No pleural effusion. No pneumothorax. HEART AND MEDIASTINUM: No acute abnormality of the cardiac and mediastinal silhouettes. BONES AND SOFT TISSUES: No acute osseous abnormality. IMPRESSION: 1. Stable right upper lobe 3.7 cm mass, unchanged, which may contribute to shortness of breath. 2. No acute findings. Electronically signed by: Waddell Calk MD 05/23/2024 12:04 PM EST RP Workstation: [id number]   CT Head Wo Contrast Result Date: 05/23/2024 EXAM: CT HEAD WITHOUT CONTRAST 05/23/2024 10:46:00 AM TECHNIQUE: CT of the head was performed without the administration of intravenous contrast. Automated exposure control, iterative reconstruction, and/or weight based adjustment of the mA/kV was utilized to reduce the radiation dose to as low as reasonably achievable. COMPARISON: CT head 05/18/2024 and MRI brain 05/10/2024. CLINICAL HISTORY: Mental status change, unknown cause; ams / headache / hx of brain mets. FINDINGS: BRAIN AND VENTRICLES: No acute hemorrhage. No evidence of acute infarct. No hydrocephalus. No extra-axial collection. Overall similar bilateral cerebral metastases with surrounding hypoattenuation likely representing a component of vasogenic edema, and associated regional mass effect without midline shift. Overall similar mild background scattered white matter hypodensities which are nonspecific but most commonly represent chronic microvascular ischemic changes. ORBITS: No acute abnormality. SINUSES: Chronic left lamina papyracea deformity. SOFT TISSUES AND SKULL: No acute soft tissue abnormality. No skull fracture. IMPRESSION: 1. No acute intracranial hemorrhage or  evidence of recently demarcated transcortical infarction. 2. Since 05/18/2024, overall  similar bilateral cerebral metastases with associated regional mass effect. No midline shift or brain herniation. Electronically signed by: prentice bybordi 05/23/2024 11:46 AM EST RP Workstation: [id number]   EEG adult Result Date: 05/19/2024 Gregg Lek, MD     05/19/2024  2:39 PM Patient Name: ROME ECHAVARRIA MRN: [id number] Epilepsy Attending: Lek Gregg Referring Physician/Provider: No ref. provider found     Date: 05/19/2024 Duration: 23 minutes Patient history: 59 year old man with seizure like activity. EEG for further evaluation Level of alertness: Awake, drowsy AEDs during EEG study: Technical aspects: This EEG study was done with scalp electrodes positioned according to the 10-20 International system of electrode placement. Electrical activity was reviewed with band pass filter of 1-70Hz , sensitivity of 7 uV/mm, display speed of 82mm/sec with a 60Hz  notched filter applied as appropriate. EEG data were recorded continuously and digitally stored.  Video monitoring was available and reviewed as appropriate. Description: The posterior dominant rhythm consists of 9-10 Hz activity of moderate voltage (25-35 uV) seen predominantly in posterior head regions, symmetric and reactive to eye opening and eye closing. Drowsiness was characterized by attenuation of the posterior background rhythm. Sleep was not seen. Hyperventilation and photic stimulation were not performed.   IMPRESSION: This study is within normal limits. No seizures or epileptiform discharges were seen throughout the recording. A normal interictal EEG does not exclude nor support the diagnosis of epilepsy. Lek Gregg MD Neurology    CT HEAD CODE STROKE WO CONTRAST (LKW 0-4.5h, LVO 0-24h) Result Date: 05/18/2024 EXAM: CT HEAD WITHOUT 05/18/2024 07:17:58 PM TECHNIQUE: CT of the head was performed without the administration of intravenous contrast.  Automated exposure control, iterative reconstruction, and/or weight based adjustment of the mA/kV was utilized to reduce the radiation dose to as low as reasonably achievable. COMPARISON: Comparison made with prior MRI from 05/10/2024. CLINICAL HISTORY: Neuro deficit, acute, stroke suspected. FINDINGS: BRAIN AND VENTRICLES: Previously identified intracranial metastases involving the right frontal and left parietal lobes, again seen, and grossly similar to previous. Surrounding vasogenic edema without midline shift, also similar. No visible new lesions on this noncontrast examination. No acute intracranial hemorrhage. No evidence of acute infarct. No hydrocephalus. No extra-axial fluid collection. Calcified atherosclerosis present about the skull base. ORBITS: No acute abnormality. SINUSES AND MASTOIDS: No acute abnormality. SOFT TISSUES AND SKULL: No acute skull fracture. No acute soft tissue abnormality. IMPRESSION: 1. No acute intracranial abnormality. 2. Aspects is 10. 3. Stable intracranial metastases involving the right frontal and left parietal lobes with surrounding vasogenic edema, without midline shift, similar to prior MRI from 05/10/2024. 4. Findings communicated by text page to Dr. Michaela at 7:28 pm on 05/18/2024. Electronically signed by: Morene Hoard MD 05/18/2024 07:31 PM EST RP Workstation: [id number]   VAS US  LOWER EXTREMITY VENOUS (DVT) Result Date: 05/11/2024  Lower Venous DVT Study Patient Name:  TOBEN ACUNA  Date of Exam:   05/11/2024 Medical Rec #: [id number]      Accession #:    [id number] Date of Birth: 1966-06-21      Patient Gender: M Patient Age:   25 years Exam Location:  [facility] Procedure:      VAS US  LOWER EXTREMITY VENOUS (DVT) Referring Phys: LEEROY DUE --------------------------------------------------------------------------------  Indications: Pain.  Risk Factors: Cancer. Comparison Study: No prior studies. Performing Technologist: Cordella Collet RVT   Examination Guidelines: A complete evaluation includes B-mode imaging, spectral Doppler, color Doppler, and power Doppler as needed of all accessible portions of each vessel. Bilateral testing is considered an  integral part of a complete examination. Limited examinations for reoccurring indications may be performed as noted. The reflux portion of the exam is performed with the patient in reverse Trendelenburg.  +---------+---------------+---------+-----------+----------+--------------+ RIGHT    CompressibilityPhasicitySpontaneityPropertiesThrombus Aging +---------+---------------+---------+-----------+----------+--------------+ CFV      Full           Yes      Yes                                 +---------+---------------+---------+-----------+----------+--------------+ SFJ      Full                                                        +---------+---------------+---------+-----------+----------+--------------+ FV Prox  Full                                                        +---------+---------------+---------+-----------+----------+--------------+ FV Mid   Full                                                        +---------+---------------+---------+-----------+----------+--------------+ FV DistalFull                                                        +---------+---------------+---------+-----------+----------+--------------+ PFV      Full                                                        +---------+---------------+---------+-----------+----------+--------------+ POP      Full           Yes      Yes                                 +---------+---------------+---------+-----------+----------+--------------+ PTV      Full                                                        +---------+---------------+---------+-----------+----------+--------------+ PERO     Full                                                         +---------+---------------+---------+-----------+----------+--------------+   +---------+---------------+---------+-----------+----------+--------------+ LEFT     CompressibilityPhasicitySpontaneityPropertiesThrombus Aging +---------+---------------+---------+-----------+----------+--------------+ CFV  Full           Yes      Yes                                 +---------+---------------+---------+-----------+----------+--------------+ SFJ      Full                                                        +---------+---------------+---------+-----------+----------+--------------+ FV Prox  Full                                                        +---------+---------------+---------+-----------+----------+--------------+ FV Mid   Full                                                        +---------+---------------+---------+-----------+----------+--------------+ FV DistalFull                                                        +---------+---------------+---------+-----------+----------+--------------+ PFV      Full                                                        +---------+---------------+---------+-----------+----------+--------------+ POP      Full           Yes      Yes                                 +---------+---------------+---------+-----------+----------+--------------+ PTV      Full                                                        +---------+---------------+---------+-----------+----------+--------------+ PERO     Full                                                        +---------+---------------+---------+-----------+----------+--------------+     Summary: RIGHT: - There is no evidence of deep vein thrombosis in the lower extremity.  - No cystic structure found in the popliteal fossa.  LEFT: - There is no evidence of deep vein thrombosis in the lower extremity.  - No cystic structure found in the popliteal fossa.   *See table(s) above for measurements and  observations. Electronically signed by Lonni Gaskins MD on 05/11/2024 at 3:26:26 PM.    Final    MR Brain W Wo Contrast Result Date: 05/11/2024 EXAM: MRI BRAIN WITH AND WITHOUT CONTRAST 05/10/2024 04:46:18 PM TECHNIQUE: Multiplanar multisequence MRI of the head/brain was performed with and without the administration of intravenous contrast. CONTRAST: 7 mL of Gadavist . COMPARISON: MR Head 05/03/2024. CLINICAL HISTORY: 59 year old male with brain metastases, pretreatment planning. FINDINGS: BRAIN AND VENTRICLES: Subcentimeter left superior cerebellar enhancing metastasis measures 3 to 4 mm on series 1100 image 124. Left superior occipital versus inferior parietal lobe thick rim enhancing metastasis is 12 mm on series 1100 image 172. More solid round and mildly lobulated enhancing metastasis in the posterior right frontal lobe is 18 mm on series 1100 image 198. Punctate enhancing metastasis in the anterior left frontal lobe is visible on both series 1100 image 204 and series 9 image 15. No other brain metastases identified. Vasogenic edema in the posterior right frontal lobe and the posterior left hemisphere is stable. There is no significant midline shift or intracranial mass effect. Normal underlying brain volume. No convincing intracranial hemorrhage. No acute infarct. No hydrocephalus. The sella is unremarkable. Normal flow voids. No dural thickening identified. Negative visible cervical spine and spinal cord. ORBITS: No acute abnormality. SINUSES: No acute abnormality. BONES AND SOFT TISSUES: Visible bone marrow signal is within normal limits. Negative visible cervical spine. No acute soft tissue abnormality. IMPRESSION: 1. Total of four enhancing brain metastases on 3T ranging from punctate (anterior left frontal) to 18 mm (posterior right frontal), all annotated on series 1100. 2. Stable vasogenic edema in the posterior right frontal lobe and posterior left  hemisphere. No significant intracranial mass effect. Electronically signed by: Helayne Hurst MD 05/11/2024 09:48 AM EST RP Workstation: [id number]   DG CHEST PORT 1 VIEW Result Date: 05/05/2024 CLINICAL DATA:  Post bronchoscopy with biopsy. EXAM: PORTABLE CHEST 1 VIEW COMPARISON:  05/03/2024 and CT 05/03/2024 FINDINGS: Lungs are hypoinflated and demonstrate evidence of patient's known right upper lobe nodule which is slightly less well-defined compatible recent bronchoscopy and biopsy. No pneumothorax. Remainder of the lungs are clear. Cardiomediastinal silhouette and remainder of the exam is unchanged. IMPRESSION: Hypoinflation with known right upper lobe nodule. No pneumothorax. Electronically Signed   By: Toribio Agreste M.D.   On: 05/05/2024 13:30   DG C-ARM BRONCHOSCOPY Result Date: 05/05/2024 C-ARM BRONCHOSCOPY: Fluoroscopy was utilized by the requesting physician.  No radiographic interpretation.   MR ABDOMEN W WO CONTRAST Result Date: 05/04/2024 EXAM: MRCP WITH AND WITHOUT IV CONTRAST 05/04/2024 08:24:35 AM TECHNIQUE: Multisequence, multiplanar magnetic resonance images of the abdomen with and without intravenous contrast. 7 mL gadobutrol  (GADAVIST ) 1 MMOL/ML injection was administered. MRCP sequences were performed. COMPARISON: Chest CT 05/03/2024. CLINICAL HISTORY: Lung mass. FINDINGS: LIVER: No enhancing lesion within the liver to suggest a hepatocellular carcinoma. No enhancing lesion to suggest metastatic disease. GALLBLADDER AND BILIARY SYSTEM: Gallbladder is unremarkable. No intrahepatic or extrahepatic ductal dilation. SPLEEN: Unremarkable. PANCREAS/PANCREATIC DUCT: Visualized pancreas is unremarkable. No pancreatic ductal dilatation. ADRENAL GLANDS: Masslike enlargement of the right adrenal gland to 3.6 x 2.3 cm on image 34 of series 802. This right adrenal mass has a suspicious enhancement pattern for metastatic disease. Similar findings in the left adrenal gland which is enlarged to 2.1 x  1.6 cm. KIDNEYS: Unremarkable. LYMPH NODES: No enlarged abdominal lymph nodes. VASCULATURE: Unremarkable. PERITONEUM: No ascites. ABDOMINAL WALL: No hernia. No mass. BOWEL: Grossly unremarkable. No bowel obstruction. BONES: No acute abnormality or worrisome osseous  lesion. SOFT TISSUES: Unremarkable. MISCELLANEOUS: Unremarkable. IMPRESSION: 1. No evidence of hepatocellular carcinoma or metastatic disease in the liver. 2. 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 Electronically signed by: Norleen Boxer MD 05/04/2024 09:30 AM EST RP Workstation: [id number]   CT CHEST W CONTRAST Result Date: 05/04/2024 CLINICAL DATA:  Pulmonary lesion on chest x-ray concerning for neoplasm. * Tracking Code: BO * EXAM: CT CHEST WITH CONTRAST TECHNIQUE: Multidetector CT imaging of the chest was performed during intravenous contrast administration. RADIATION DOSE REDUCTION: This exam was performed according to the departmental dose-optimization program which includes automated exposure control, adjustment of the mA and/or kV according to patient size and/or use of iterative reconstruction technique. CONTRAST:  50mL OMNIPAQUE  IOHEXOL  350 MG/ML SOLN COMPARISON:  Chest x-ray earlier same day.  Chest CT 04/04/2020 FINDINGS: Cardiovascular: The heart size is normal. No substantial pericardial effusion. No thoracic aortic aneurysm. Mediastinum/Nodes: 1.7 cm short axis necrotic precarinal lymph node evident. 1.8 cm short axis necrotic right hilar lymph node. No left hilar lymphadenopathy. The esophagus has normal imaging features. There is no axillary lymphadenopathy. Lungs/Pleura: 3.1 x 3.5 x 3.4 cm right upper lobe pulmonary mass is tethered to the peripheral pleura. No other suspicious pulmonary nodule or mass. No pulmonary edema or pleural effusion. Centrilobular and paraseptal emphysema evident. Upper Abdomen: 3.7 x 2.8 x 6.7 cm heterogeneously enhancing right adrenal mass evident. 1.9 x 1.8 x 1.8 cm left  adrenal nodule. Musculoskeletal: No worrisome lytic or sclerotic osseous abnormality. IMPRESSION: 1. 3.1 x 3.5 x 3.4 cm right upper lobe pulmonary mass is tethered to the peripheral pleura. Imaging features are compatible with primary bronchogenic neoplasm. 2. Necrotic right hilar and precarinal lymphadenopathy consistent with metastatic disease. 3. Bilateral adrenal nodules, right larger than left, consistent with metastatic disease. 4.  Emphysema (ICD10-J43.9). Electronically Signed   By: Camellia Candle M.D.   On: 05/04/2024 05:39   MR Brain W and Wo Contrast Result Date: 05/03/2024 EXAM: MRI BRAIN WITH AND WITHOUT CONTRAST 05/03/2024 02:05:09 PM TECHNIQUE: Multiplanar multisequence MRI of the head/brain was performed with and without the administration of intravenous contrast. 7 mL (gadobutrol  (GADAVIST ) 1 MMOL/ML injection 7 mL GADOBUTROL  1 MMOL/ML IV SOLN). COMPARISON: CT head 05/03/2024. CLINICAL HISTORY: Brain/CNS neoplasm, staging; Neuro deficit, acute, stroke suspected. Slurred speech, facial droop, dysphagia, and left sided numbness/weakness. FINDINGS: LIMITATIONS: The examination is intermittently up to moderately motion degraded. BRAIN AND VENTRICLES: Peripherally enhancing lesions measure 1.7 x 1.3 cm in the posterior right frontal lobe and 1.0 x 0.8 x 1.0 cm in the left parietal lobe. There is moderate associated vasogenic edema, greater in the posterior right frontal lobe, with associated sulcal effacement and slight mass effect on the right greater than left lateral ventricles. There is no midline shift. A punctate focus of susceptibility associated with the right frontal lesion may reflect minimal blood products or mineralization. There is also a punctate 1.5-2 mm enhancing lesion in the superior left cerebellar hemisphere (series 18 image 48 and series 20 image 15) without edema. There is no restricted diffusion associated with these lesions. No acute infarct, hydrocephalus, or extra axial fluid  collection is evident. Cerebral volume is normal. Major intracranial vascular flow voids are preserved. ORBITS: No acute abnormality. SINUSES: No acute abnormality. BONES AND SOFT TISSUES: Normal bone marrow signal and enhancement. Small right frontal scalp lipoma. IMPRESSION: 1. Enhancing lesions with edema in the right frontal and left parietal lobes as well as a punctate lesion in the left cerebellum, most consistent with metastatic disease.  Electronically signed by: Dasie Hamburg MD 05/03/2024 03:24 PM EST RP Workstation: [id number]   DG Chest Port 1 View Result Date: 05/03/2024 EXAM: 1 VIEW(S) XRAY OF THE CHEST 05/03/2024 02:15:00 PM COMPARISON: Chest CT 04/04/2020. CLINICAL HISTORY: Brain lesions, slurred speech, weakness, and dysphasia. Possible metastases in brain. FINDINGS: LUNGS AND PLEURA: 3.2 cm right upper lobe mass. No pulmonary edema. No pleural effusion. No pneumothorax. HEART AND MEDIASTINUM: No acute abnormality of the cardiac and mediastinal silhouettes. BONES AND SOFT TISSUES: No acute osseous abnormality. IMPRESSION: 1. Right upper lobe pulmonary mass measuring 3.2 cm, suspicious for primary lung neoplasm; recommend dedicated chest CT for further characterization and staging evaluation, and oncologic referral. Electronically signed by: Maude Stammer MD 05/03/2024 02:29 PM EST RP Workstation: [id number]   CT HEAD WO CONTRAST Result Date: 05/03/2024 EXAM: CT HEAD WITHOUT CONTRAST 05/03/2024 12:06:07 PM TECHNIQUE: CT of the head was performed without the administration of intravenous contrast. Automated exposure control, iterative reconstruction, and/or weight based adjustment of the mA/kV was utilized to reduce the radiation dose to as low as reasonably achievable. COMPARISON: None available. CLINICAL HISTORY: Neuro deficit, acute, stroke suspected. FINDINGS: BRAIN AND VENTRICLES: Hyperdense masses in the posterior right frontal lobe and left parietal lobe measure 1.3 cm and 1.0 cm,  respectively. Associated moderate vasogenic edema is greater in the right frontal lobe. There is mild regional mass effect without significant midline shift. Elsewhere, no acute infarct, intracranial hemorrhage, hydrocephalus, or extra-axial fluid collection is identified. Cerebral volume is normal. ORBITS: No acute abnormality. SINUSES: No acute abnormality. SOFT TISSUES AND SKULL: No acute soft tissue abnormality. No skull fracture. IMPRESSION: 1. Right frontal and left parietal masses with moderate vasogenic edema. A brain MRI without and with IV contrast is recommended for further evaluation. Electronically signed by: Dasie Hamburg MD 05/03/2024 12:21 PM EST RP Workstation: [id number]    Microbiology: Recent Results (from the past 240 hours)  Culture, blood (routine x 2)     Status: None   Collection Time: 05/23/24 12:11 PM   Specimen: BLOOD LEFT FOREARM  Result Value Ref Range Status   Specimen Description BLOOD LEFT FOREARM  Final   Special Requests   Final    BOTTLES DRAWN AEROBIC AND ANAEROBIC Blood Culture results may not be optimal due to an inadequate volume of blood received in culture bottles   Culture   Final    NO GROWTH 5 DAYS Performed at [facility], [address], [place], [place] [address]    Report Status 05/28/2024 FINAL  Final    Time spent: no charge minutes  Signed: Murvin Mana, MD 06/17/24

## 2024-06-23 NOTE — Progress Notes (Signed)
 Patient on [facility] expired at 0130, family notified, daughter at bedside, hospitalist Dr Madison Peaches notified and [facility] notified. Attending MD to sign Death certificate and document death summary per Hospitalist Dr. Madison Peaches.

## 2024-06-23 DEATH — deceased
